# Patient Record
Sex: Male | Born: 1965
Health system: Southern US, Community
[De-identification: ages and names within clinical notes are randomized; demographics above are authoritative.]

## PROBLEM LIST (undated history)

## (undated) DIAGNOSIS — S0292XA Unspecified fracture of facial bones, initial encounter for closed fracture: Secondary | ICD-10-CM

## (undated) DIAGNOSIS — J45909 Unspecified asthma, uncomplicated: Secondary | ICD-10-CM

## (undated) DIAGNOSIS — K011 Impacted teeth: Secondary | ICD-10-CM

## (undated) DIAGNOSIS — M1990 Unspecified osteoarthritis, unspecified site: Secondary | ICD-10-CM

## (undated) DIAGNOSIS — J189 Pneumonia, unspecified organism: Secondary | ICD-10-CM

## (undated) DIAGNOSIS — S025XXA Fracture of tooth (traumatic), initial encounter for closed fracture: Secondary | ICD-10-CM

## (undated) DIAGNOSIS — W3400XA Accidental discharge from unspecified firearms or gun, initial encounter: Secondary | ICD-10-CM

## (undated) DIAGNOSIS — J449 Chronic obstructive pulmonary disease, unspecified: Secondary | ICD-10-CM

---

## 1993-10-22 DIAGNOSIS — W3400XA Accidental discharge from unspecified firearms or gun, initial encounter: Secondary | ICD-10-CM

## 1993-10-22 HISTORY — PX: PARTIAL COLECTOMY: SHX5273

## 1993-10-22 HISTORY — DX: Accidental discharge from unspecified firearms or gun, initial encounter: W34.00XA

## 1993-10-22 HISTORY — PX: EXPLORATORY LAPAROTOMY W/ BOWEL RESECTION: SHX1544

## 1993-10-22 HISTORY — PX: TOTAL NEPHRECTOMY: SHX415

## 2001-11-03 ENCOUNTER — Emergency Department (HOSPITAL_COMMUNITY): Admission: EM | Admit: 2001-11-03 | Discharge: 2001-11-03 | Payer: Self-pay | Admitting: Emergency Medicine

## 2001-11-17 ENCOUNTER — Emergency Department (HOSPITAL_COMMUNITY): Admission: EM | Admit: 2001-11-17 | Discharge: 2001-11-17 | Payer: Self-pay | Admitting: *Deleted

## 2001-11-17 ENCOUNTER — Emergency Department (HOSPITAL_COMMUNITY): Admission: EM | Admit: 2001-11-17 | Discharge: 2001-11-18 | Payer: Self-pay | Admitting: *Deleted

## 2001-11-18 ENCOUNTER — Encounter: Payer: Self-pay | Admitting: Emergency Medicine

## 2001-12-01 ENCOUNTER — Emergency Department (HOSPITAL_COMMUNITY): Admission: EM | Admit: 2001-12-01 | Discharge: 2001-12-02 | Payer: Self-pay | Admitting: Emergency Medicine

## 2001-12-02 ENCOUNTER — Encounter: Payer: Self-pay | Admitting: Emergency Medicine

## 2001-12-05 ENCOUNTER — Inpatient Hospital Stay (HOSPITAL_COMMUNITY): Admission: EM | Admit: 2001-12-05 | Discharge: 2001-12-08 | Payer: Self-pay | Admitting: Emergency Medicine

## 2001-12-05 ENCOUNTER — Encounter: Payer: Self-pay | Admitting: Emergency Medicine

## 2001-12-06 ENCOUNTER — Encounter: Payer: Self-pay | Admitting: Internal Medicine

## 2001-12-12 ENCOUNTER — Encounter: Payer: Self-pay | Admitting: *Deleted

## 2001-12-12 ENCOUNTER — Emergency Department (HOSPITAL_COMMUNITY): Admission: EM | Admit: 2001-12-12 | Discharge: 2001-12-12 | Payer: Self-pay | Admitting: Podiatry

## 2002-03-24 ENCOUNTER — Encounter: Payer: Self-pay | Admitting: Emergency Medicine

## 2002-03-24 ENCOUNTER — Emergency Department (HOSPITAL_COMMUNITY): Admission: EM | Admit: 2002-03-24 | Discharge: 2002-03-24 | Payer: Self-pay | Admitting: Emergency Medicine

## 2002-05-26 ENCOUNTER — Emergency Department (HOSPITAL_COMMUNITY): Admission: EM | Admit: 2002-05-26 | Discharge: 2002-05-26 | Payer: Self-pay | Admitting: Emergency Medicine

## 2002-06-22 ENCOUNTER — Emergency Department (HOSPITAL_COMMUNITY): Admission: EM | Admit: 2002-06-22 | Discharge: 2002-06-23 | Payer: Self-pay | Admitting: Emergency Medicine

## 2002-07-03 ENCOUNTER — Emergency Department (HOSPITAL_COMMUNITY): Admission: EM | Admit: 2002-07-03 | Discharge: 2002-07-03 | Payer: Self-pay | Admitting: Emergency Medicine

## 2002-07-13 ENCOUNTER — Emergency Department (HOSPITAL_COMMUNITY): Admission: EM | Admit: 2002-07-13 | Discharge: 2002-07-13 | Payer: Self-pay | Admitting: Emergency Medicine

## 2003-04-27 ENCOUNTER — Emergency Department (HOSPITAL_COMMUNITY): Admission: EM | Admit: 2003-04-27 | Discharge: 2003-04-27 | Payer: Self-pay | Admitting: Emergency Medicine

## 2003-05-22 ENCOUNTER — Emergency Department: Admission: AC | Admit: 2003-05-22 | Discharge: 2003-05-22 | Payer: Self-pay

## 2003-05-26 ENCOUNTER — Emergency Department (HOSPITAL_COMMUNITY): Admission: EM | Admit: 2003-05-26 | Discharge: 2003-05-26 | Payer: Self-pay | Admitting: Emergency Medicine

## 2003-05-26 ENCOUNTER — Encounter: Payer: Self-pay | Admitting: Emergency Medicine

## 2003-08-23 ENCOUNTER — Emergency Department (HOSPITAL_COMMUNITY): Admission: EM | Admit: 2003-08-23 | Discharge: 2003-08-23 | Payer: Self-pay | Admitting: Emergency Medicine

## 2003-10-23 HISTORY — PX: ABDOMINAL ADHESION SURGERY: SHX90

## 2003-10-23 HISTORY — PX: INCISIONAL HERNIA REPAIR: SHX193

## 2003-10-24 ENCOUNTER — Inpatient Hospital Stay (HOSPITAL_COMMUNITY): Admission: EM | Admit: 2003-10-24 | Discharge: 2003-11-03 | Payer: Self-pay | Admitting: Advanced Practice Midwife

## 2003-10-25 ENCOUNTER — Encounter: Payer: Self-pay | Admitting: General Surgery

## 2003-12-09 ENCOUNTER — Emergency Department (HOSPITAL_COMMUNITY): Admission: EM | Admit: 2003-12-09 | Discharge: 2003-12-09 | Payer: Self-pay | Admitting: Emergency Medicine

## 2005-11-28 ENCOUNTER — Inpatient Hospital Stay (HOSPITAL_COMMUNITY): Admission: EM | Admit: 2005-11-28 | Discharge: 2005-11-30 | Payer: Self-pay | Admitting: Emergency Medicine

## 2006-03-23 ENCOUNTER — Emergency Department (HOSPITAL_COMMUNITY): Admission: EM | Admit: 2006-03-23 | Discharge: 2006-03-23 | Payer: Self-pay | Admitting: Family Medicine

## 2006-07-23 ENCOUNTER — Emergency Department (HOSPITAL_COMMUNITY): Admission: EM | Admit: 2006-07-23 | Discharge: 2006-07-23 | Payer: Self-pay | Admitting: Emergency Medicine

## 2006-08-01 ENCOUNTER — Emergency Department (HOSPITAL_COMMUNITY): Admission: EM | Admit: 2006-08-01 | Discharge: 2006-08-01 | Payer: Self-pay | Admitting: Family Medicine

## 2006-11-17 ENCOUNTER — Emergency Department (HOSPITAL_COMMUNITY): Admission: EM | Admit: 2006-11-17 | Discharge: 2006-11-17 | Payer: Self-pay | Admitting: Emergency Medicine

## 2006-12-27 ENCOUNTER — Emergency Department (HOSPITAL_COMMUNITY): Admission: EM | Admit: 2006-12-27 | Discharge: 2006-12-28 | Payer: Self-pay | Admitting: Emergency Medicine

## 2007-04-20 ENCOUNTER — Emergency Department (HOSPITAL_COMMUNITY): Admission: EM | Admit: 2007-04-20 | Discharge: 2007-04-21 | Payer: Self-pay | Admitting: Emergency Medicine

## 2007-06-10 ENCOUNTER — Inpatient Hospital Stay (HOSPITAL_COMMUNITY): Admission: EM | Admit: 2007-06-10 | Discharge: 2007-06-12 | Payer: Self-pay | Admitting: Emergency Medicine

## 2008-02-29 ENCOUNTER — Emergency Department (HOSPITAL_COMMUNITY): Admission: EM | Admit: 2008-02-29 | Discharge: 2008-02-29 | Payer: Self-pay | Admitting: Emergency Medicine

## 2008-03-04 ENCOUNTER — Emergency Department (HOSPITAL_COMMUNITY): Admission: EM | Admit: 2008-03-04 | Discharge: 2008-03-05 | Payer: Self-pay | Admitting: Emergency Medicine

## 2008-03-22 ENCOUNTER — Emergency Department (HOSPITAL_COMMUNITY): Admission: EM | Admit: 2008-03-22 | Discharge: 2008-03-22 | Payer: Self-pay | Admitting: Emergency Medicine

## 2008-06-18 ENCOUNTER — Inpatient Hospital Stay (HOSPITAL_COMMUNITY): Admission: EM | Admit: 2008-06-18 | Discharge: 2008-06-20 | Payer: Self-pay | Admitting: Emergency Medicine

## 2008-08-02 ENCOUNTER — Emergency Department (HOSPITAL_COMMUNITY): Admission: EM | Admit: 2008-08-02 | Discharge: 2008-08-03 | Payer: Self-pay | Admitting: Emergency Medicine

## 2008-11-02 ENCOUNTER — Ambulatory Visit: Payer: Self-pay | Admitting: Internal Medicine

## 2008-11-02 ENCOUNTER — Inpatient Hospital Stay (HOSPITAL_COMMUNITY): Admission: EM | Admit: 2008-11-02 | Discharge: 2008-11-05 | Payer: Self-pay | Admitting: Emergency Medicine

## 2009-01-10 ENCOUNTER — Emergency Department (HOSPITAL_COMMUNITY): Admission: EM | Admit: 2009-01-10 | Discharge: 2009-01-10 | Payer: Self-pay | Admitting: Family Medicine

## 2009-04-21 ENCOUNTER — Emergency Department (HOSPITAL_COMMUNITY): Admission: EM | Admit: 2009-04-21 | Discharge: 2009-04-21 | Payer: Self-pay | Admitting: Emergency Medicine

## 2009-05-20 ENCOUNTER — Emergency Department (HOSPITAL_COMMUNITY): Admission: EM | Admit: 2009-05-20 | Discharge: 2009-05-20 | Payer: Self-pay | Admitting: Family Medicine

## 2009-06-20 ENCOUNTER — Emergency Department (HOSPITAL_COMMUNITY): Admission: EM | Admit: 2009-06-20 | Discharge: 2009-06-20 | Payer: Self-pay | Admitting: Family Medicine

## 2009-07-06 ENCOUNTER — Inpatient Hospital Stay (HOSPITAL_COMMUNITY): Admission: EM | Admit: 2009-07-06 | Discharge: 2009-07-08 | Payer: Self-pay | Admitting: Emergency Medicine

## 2009-07-06 ENCOUNTER — Encounter (INDEPENDENT_AMBULATORY_CARE_PROVIDER_SITE_OTHER): Payer: Self-pay | Admitting: Internal Medicine

## 2009-07-06 ENCOUNTER — Ambulatory Visit: Payer: Self-pay | Admitting: Cardiology

## 2009-07-12 ENCOUNTER — Ambulatory Visit: Payer: Self-pay | Admitting: Cardiovascular Disease

## 2009-07-12 ENCOUNTER — Inpatient Hospital Stay (HOSPITAL_COMMUNITY): Admission: EM | Admit: 2009-07-12 | Discharge: 2009-07-17 | Payer: Self-pay | Admitting: Emergency Medicine

## 2009-07-13 ENCOUNTER — Encounter (INDEPENDENT_AMBULATORY_CARE_PROVIDER_SITE_OTHER): Payer: Self-pay | Admitting: Internal Medicine

## 2009-07-25 ENCOUNTER — Emergency Department (HOSPITAL_COMMUNITY): Admission: EM | Admit: 2009-07-25 | Discharge: 2009-07-25 | Payer: Self-pay | Admitting: Family Medicine

## 2010-01-04 LAB — CONVERTED CEMR LAB
Hep A Total Ab: NEGATIVE
Hep B Core Total Ab: NEGATIVE
Hepatitis B Surface Ag: NEGATIVE

## 2010-01-05 ENCOUNTER — Encounter: Payer: Self-pay | Admitting: Physician Assistant

## 2010-01-05 ENCOUNTER — Encounter (INDEPENDENT_AMBULATORY_CARE_PROVIDER_SITE_OTHER): Payer: Self-pay | Admitting: Internal Medicine

## 2010-01-05 ENCOUNTER — Inpatient Hospital Stay (HOSPITAL_COMMUNITY): Admission: EM | Admit: 2010-01-05 | Discharge: 2010-01-06 | Payer: Self-pay | Admitting: Emergency Medicine

## 2010-01-05 ENCOUNTER — Ambulatory Visit: Payer: Self-pay | Admitting: Vascular Surgery

## 2010-01-06 ENCOUNTER — Encounter: Payer: Self-pay | Admitting: Physician Assistant

## 2010-01-22 ENCOUNTER — Emergency Department (HOSPITAL_COMMUNITY): Admission: EM | Admit: 2010-01-22 | Discharge: 2010-01-22 | Payer: Self-pay | Admitting: Emergency Medicine

## 2010-01-22 LAB — CONVERTED CEMR LAB
AST: 73 units/L
Albumin: 3.9 g/dL
BUN: 10 mg/dL
Basophils Relative: 0 %
Calcium: 9.1 mg/dL
Chloride: 103 meq/L
Creatinine, Ser: 1.11 mg/dL
Eosinophils Absolute: 0.1 10*3/uL
Glucose, Bld: 89 mg/dL
Hemoglobin: 14.6 g/dL
Lymphs Abs: 0.6 10*3/uL
MCHC: 33.7 g/dL
MCV: 98.3 fL
Monocytes Absolute: 0.3 10*3/uL
Monocytes Relative: 5 %
Neutrophils Relative %: 82 %
Potassium: 4 meq/L
RBC: 4.42 M/uL
WBC: 5.3 10*3/uL

## 2010-01-24 ENCOUNTER — Ambulatory Visit: Payer: Self-pay | Admitting: Physician Assistant

## 2010-01-24 DIAGNOSIS — F1010 Alcohol abuse, uncomplicated: Secondary | ICD-10-CM | POA: Insufficient documentation

## 2010-01-24 DIAGNOSIS — F1910 Other psychoactive substance abuse, uncomplicated: Secondary | ICD-10-CM | POA: Insufficient documentation

## 2010-01-24 DIAGNOSIS — F3289 Other specified depressive episodes: Secondary | ICD-10-CM | POA: Insufficient documentation

## 2010-01-24 DIAGNOSIS — F329 Major depressive disorder, single episode, unspecified: Secondary | ICD-10-CM | POA: Insufficient documentation

## 2010-01-24 DIAGNOSIS — J449 Chronic obstructive pulmonary disease, unspecified: Secondary | ICD-10-CM | POA: Insufficient documentation

## 2010-01-24 DIAGNOSIS — K219 Gastro-esophageal reflux disease without esophagitis: Secondary | ICD-10-CM | POA: Insufficient documentation

## 2010-01-24 DIAGNOSIS — Z8669 Personal history of other diseases of the nervous system and sense organs: Secondary | ICD-10-CM | POA: Insufficient documentation

## 2010-01-24 DIAGNOSIS — M1990 Unspecified osteoarthritis, unspecified site: Secondary | ICD-10-CM | POA: Insufficient documentation

## 2010-01-31 ENCOUNTER — Ambulatory Visit: Payer: Self-pay | Admitting: Physician Assistant

## 2010-03-29 ENCOUNTER — Encounter: Payer: Self-pay | Admitting: Physician Assistant

## 2010-10-22 HISTORY — PX: REPAIR OF FRACTURED PENIS: SHX6063

## 2010-11-21 NOTE — Letter (Signed)
Summary: Discharge Summary  Discharge Summary   Imported By: Arta Bruce 03/29/2010 15:43:26  _____________________________________________________________________  External Attachment:    Type:   Image     Comment:   External Document

## 2010-11-21 NOTE — Letter (Signed)
Summary: PT INFORMATION SHEET  PT INFORMATION SHEET   Imported By: Arta Bruce 03/28/2010 11:07:08  _____________________________________________________________________  External Attachment:    Type:   Image     Comment:   External Document

## 2010-11-21 NOTE — Letter (Signed)
Summary: FAXED REQUESTED RECORDS TO [facility]  FAXED REQUESTED RECORDS TO [facility]   Imported By: Arta Bruce 03/29/2010 11:53:18  _____________________________________________________________________  External Attachment:    Type:   Image     Comment:   External Document
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
## 2010-11-21 NOTE — Progress Notes (Signed)
Summary: Office Visit//DEPRESSION SCREENING  Office Visit//DEPRESSION SCREENING   Imported By: Arta Bruce 03/29/2010 15:39:56  _____________________________________________________________________  External Attachment:    Type:   Image     Comment:   External Document

## 2010-11-21 NOTE — Letter (Signed)
Summary: REFERRAL//PSYCHOLOGY//AMANDA  REFERRAL//PSYCHOLOGY//AMANDA   Imported By: Arta Bruce 03/28/2010 11:27:01  _____________________________________________________________________  External Attachment:    Type:   Image     Comment:   External Document

## 2010-11-24 NOTE — Letter (Signed)
Summary: REFERRAL//MISC//VANESSA  REFERRAL//MISC//VANESSA   Imported By: Arta Bruce 03/28/2010 11:29:03  _____________________________________________________________________  External Attachment:    Type:   Image     Comment:   External Document

## 2011-01-10 LAB — HEPATIC FUNCTION PANEL
AST: 73 U/L — ABNORMAL HIGH (ref 0–37)
Albumin: 3.9 g/dL (ref 3.5–5.2)
Total Bilirubin: 0.7 mg/dL (ref 0.3–1.2)
Total Protein: 7.1 g/dL (ref 6.0–8.3)

## 2011-01-10 LAB — CBC
MCHC: 33.7 g/dL (ref 30.0–36.0)
MCV: 98.3 fL (ref 78.0–100.0)
Platelets: 171 10*3/uL (ref 150–400)
RDW: 15.5 % (ref 11.5–15.5)

## 2011-01-10 LAB — DIFFERENTIAL
Basophils Absolute: 0 10*3/uL (ref 0.0–0.1)
Basophils Relative: 0 % (ref 0–1)
Eosinophils Absolute: 0.1 10*3/uL (ref 0.0–0.7)
Monocytes Relative: 5 % (ref 3–12)
Neutro Abs: 4.4 10*3/uL (ref 1.7–7.7)
Neutrophils Relative %: 82 % — ABNORMAL HIGH (ref 43–77)

## 2011-01-10 LAB — URINALYSIS, ROUTINE W REFLEX MICROSCOPIC
Ketones, ur: NEGATIVE mg/dL
Nitrite: NEGATIVE
pH: 6 (ref 5.0–8.0)

## 2011-01-10 LAB — BASIC METABOLIC PANEL
BUN: 10 mg/dL (ref 6–23)
CO2: 27 mEq/L (ref 19–32)
Calcium: 9.1 mg/dL (ref 8.4–10.5)
Chloride: 103 mEq/L (ref 96–112)
Creatinine, Ser: 1.11 mg/dL (ref 0.4–1.5)
GFR calc Af Amer: >60 mL/min (ref >60)
Glucose, Bld: 89 mg/dL (ref 70–99)

## 2011-01-10 LAB — LIPASE, BLOOD: Lipase: 28 U/L (ref 11–59)

## 2011-01-14 LAB — HEPATITIS PANEL, ACUTE
HCV Ab: NEGATIVE
Hep A IgM: NEGATIVE
Hepatitis B Surface Ag: NEGATIVE

## 2011-01-14 LAB — CBC
Hemoglobin: 11.9 g/dL — ABNORMAL LOW (ref 13.0–17.0)
Hemoglobin: 13.2 g/dL (ref 13.0–17.0)
MCHC: 33.3 g/dL (ref 30.0–36.0)
MCV: 100 fL (ref 78.0–100.0)
Platelets: 170 10*3/uL (ref 150–400)
RBC: 3.57 MIL/uL — ABNORMAL LOW (ref 4.22–5.81)
RDW: 15.2 % (ref 11.5–15.5)
WBC: 5.4 10*3/uL (ref 4.0–10.5)

## 2011-01-14 LAB — CK TOTAL AND CKMB (NOT AT ARMC)
CK, MB: 7.4 ng/mL (ref 0.3–4.0)
Relative Index: 1.3 (ref 0.0–2.5)
Total CK: 550 U/L — ABNORMAL HIGH (ref 7–232)

## 2011-01-14 LAB — COMPREHENSIVE METABOLIC PANEL
ALT: 26 U/L (ref 0–53)
ALT: 29 U/L (ref 0–53)
Albumin: 3.7 g/dL (ref 3.5–5.2)
Alkaline Phosphatase: 67 U/L (ref 39–117)
CO2: 25 mEq/L (ref 19–32)
Calcium: 8.3 mg/dL — ABNORMAL LOW (ref 8.4–10.5)
Chloride: 103 mEq/L (ref 96–112)
GFR calc non Af Amer: >60 mL/min (ref >60)
Glucose, Bld: 196 mg/dL — ABNORMAL HIGH (ref 70–99)
Glucose, Bld: 83 mg/dL (ref 70–99)
Potassium: 4 mEq/L (ref 3.5–5.1)
Sodium: 137 mEq/L (ref 135–145)
Sodium: 139 mEq/L (ref 135–145)
Total Protein: 6.6 g/dL (ref 6.0–8.3)

## 2011-01-14 LAB — PROTIME-INR
INR: 1.14 (ref 0.00–1.49)
Prothrombin Time: 14.5 seconds (ref 11.6–15.2)

## 2011-01-14 LAB — CARDIAC PANEL(CRET KIN+CKTOT+MB+TROPI)
CK, MB: 4.3 ng/mL — ABNORMAL HIGH (ref 0.3–4.0)
CK, MB: 5.4 ng/mL — ABNORMAL HIGH (ref 0.3–4.0)
Total CK: 358 U/L — ABNORMAL HIGH (ref 7–232)
Troponin I: 0.01 ng/mL (ref 0.00–0.06)

## 2011-01-14 LAB — DIFFERENTIAL
Basophils Absolute: 0 10*3/uL (ref 0.0–0.1)
Basophils Relative: 0 % (ref 0–1)
Basophils Relative: 0 % (ref 0–1)
Eosinophils Absolute: 0.1 10*3/uL (ref 0.0–0.7)
Eosinophils Absolute: 0.1 10*3/uL (ref 0.0–0.7)
Eosinophils Relative: 2 % (ref 0–5)
Lymphs Abs: 1.5 10*3/uL (ref 0.7–4.0)
Monocytes Absolute: 0.4 10*3/uL (ref 0.1–1.0)
Monocytes Relative: 8 % (ref 3–12)
Neutrophils Relative %: 50 % (ref 43–77)

## 2011-01-14 LAB — TROPONIN I: Troponin I: 0.02 ng/mL (ref 0.00–0.06)

## 2011-01-14 LAB — RAPID URINE DRUG SCREEN, HOSP PERFORMED: Tetrahydrocannabinol: POSITIVE — AB

## 2011-01-25 LAB — GC/CHLAMYDIA PROBE AMP, GENITAL
Chlamydia, DNA Probe: NEGATIVE
GC Probe Amp, Genital: POSITIVE — AB

## 2011-01-26 LAB — URINALYSIS, ROUTINE W REFLEX MICROSCOPIC
Bilirubin Urine: NEGATIVE
Leukocytes, UA: NEGATIVE
Nitrite: NEGATIVE
Nitrite: NEGATIVE
Protein, ur: NEGATIVE mg/dL
Protein, ur: NEGATIVE mg/dL
Specific Gravity, Urine: 1.005 (ref 1.005–1.030)
Specific Gravity, Urine: 1.024 (ref 1.005–1.030)
Urobilinogen, UA: 0.2 mg/dL (ref 0.0–1.0)
Urobilinogen, UA: 1 mg/dL (ref 0.0–1.0)

## 2011-01-26 LAB — CBC
HCT: 36.8 % — ABNORMAL LOW (ref 39.0–52.0)
HCT: 41.6 % (ref 39.0–52.0)
HCT: 37.2 % — ABNORMAL LOW (ref 39.0–52.0)
HCT: 43.9 % (ref 39.0–52.0)
Hemoglobin: 12.4 g/dL — ABNORMAL LOW (ref 13.0–17.0)
Hemoglobin: 12.8 g/dL — ABNORMAL LOW (ref 13.0–17.0)
Hemoglobin: 12.8 g/dL — ABNORMAL LOW (ref 13.0–17.0)
Hemoglobin: 14.8 g/dL (ref 13.0–17.0)
MCHC: 33.5 g/dL (ref 30.0–36.0)
MCHC: 33.6 g/dL (ref 30.0–36.0)
MCHC: 33.7 g/dL (ref 30.0–36.0)
MCHC: 34.3 g/dL (ref 30.0–36.0)
MCV: 101.9 fL — ABNORMAL HIGH (ref 78.0–100.0)
MCV: 103.2 fL — ABNORMAL HIGH (ref 78.0–100.0)
MCV: 102.5 fL — ABNORMAL HIGH (ref 78.0–100.0)
Platelets: 147 10*3/uL — ABNORMAL LOW (ref 150–400)
RBC: 3.56 MIL/uL — ABNORMAL LOW (ref 4.22–5.81)
RBC: 3.7 MIL/uL — ABNORMAL LOW (ref 4.22–5.81)
RBC: 4.09 MIL/uL — ABNORMAL LOW (ref 4.22–5.81)
RBC: 3.63 MIL/uL — ABNORMAL LOW (ref 4.22–5.81)
RBC: 4.28 MIL/uL (ref 4.22–5.81)
RDW: 14.4 % (ref 11.5–15.5)
RDW: 14.4 % (ref 11.5–15.5)
WBC: 3.2 10*3/uL — ABNORMAL LOW (ref 4.0–10.5)
WBC: 3.6 10*3/uL — ABNORMAL LOW (ref 4.0–10.5)
WBC: 6.1 10*3/uL (ref 4.0–10.5)

## 2011-01-26 LAB — BASIC METABOLIC PANEL
CO2: 24 mEq/L (ref 19–32)
Calcium: 8.1 mg/dL — ABNORMAL LOW (ref 8.4–10.5)
Chloride: 107 mEq/L (ref 96–112)
Creatinine, Ser: 1.34 mg/dL (ref 0.4–1.5)
GFR calc Af Amer: >60 mL/min (ref >60)
GFR calc Af Amer: >60 mL/min (ref >60)
GFR calc non Af Amer: >60 mL/min (ref >60)
Potassium: 3.8 mEq/L (ref 3.5–5.1)
Potassium: 4 mEq/L (ref 3.5–5.1)
Sodium: 137 mEq/L (ref 135–145)
Sodium: 138 mEq/L (ref 135–145)

## 2011-01-26 LAB — RAPID URINE DRUG SCREEN, HOSP PERFORMED
Amphetamines: NOT DETECTED
Amphetamines: NOT DETECTED
Barbiturates: NOT DETECTED
Benzodiazepines: NOT DETECTED
Benzodiazepines: POSITIVE — AB
Cocaine: POSITIVE — AB
Opiates: NOT DETECTED
Tetrahydrocannabinol: NOT DETECTED
Tetrahydrocannabinol: POSITIVE — AB

## 2011-01-26 LAB — COMPREHENSIVE METABOLIC PANEL
ALT: 120 U/L — ABNORMAL HIGH (ref 0–53)
Alkaline Phosphatase: 75 U/L (ref 39–117)
BUN: 5 mg/dL — ABNORMAL LOW (ref 6–23)
BUN: 6 mg/dL (ref 6–23)
CO2: 27 mEq/L (ref 19–32)
CO2: 28 mEq/L (ref 19–32)
Calcium: 8.2 mg/dL — ABNORMAL LOW (ref 8.4–10.5)
Calcium: 8.9 mg/dL (ref 8.4–10.5)
Chloride: 104 mEq/L (ref 96–112)
Creatinine, Ser: 1.16 mg/dL (ref 0.4–1.5)
GFR calc non Af Amer: >60 mL/min (ref >60)
GFR calc non Af Amer: >60 mL/min (ref >60)
Glucose, Bld: 81 mg/dL (ref 70–99)
Glucose, Bld: 89 mg/dL (ref 70–99)
Total Bilirubin: 0.8 mg/dL (ref 0.3–1.2)
Total Protein: 6.9 g/dL (ref 6.0–8.3)

## 2011-01-26 LAB — POCT CARDIAC MARKERS
CKMB, poc: 9.3 ng/mL (ref 1.0–8.0)
Myoglobin, poc: 389 ng/mL (ref 12–200)
Troponin i, poc: <0.05 ng/mL (ref 0.00–0.09)

## 2011-01-26 LAB — POCT I-STAT, CHEM 8
BUN: 9 mg/dL (ref 6–23)
Chloride: 103 mEq/L (ref 96–112)
HCT: 46 % (ref 39.0–52.0)
Sodium: 140 mEq/L (ref 135–145)
TCO2: 25 mmol/L (ref 0–100)

## 2011-01-26 LAB — LIPID PANEL
Cholesterol: 131 mg/dL (ref 0–200)
Cholesterol: 160 mg/dL (ref 0–200)
HDL: 58 mg/dL (ref >39)
LDL Cholesterol: 65 mg/dL (ref 0–99)
Total CHOL/HDL Ratio: 2.3 RATIO
Triglycerides: 45 mg/dL (ref <150)
VLDL: 14 mg/dL (ref 0–40)
VLDL: 9 mg/dL (ref 0–40)

## 2011-01-26 LAB — CARDIAC PANEL(CRET KIN+CKTOT+MB+TROPI)
CK, MB: 2 ng/mL (ref 0.3–4.0)
CK, MB: 2.9 ng/mL (ref 0.3–4.0)
Relative Index: 0.5 (ref 0.0–2.5)
Total CK: 465 U/L — ABNORMAL HIGH (ref 7–232)
Total CK: 617 U/L — ABNORMAL HIGH (ref 7–232)
Total CK: 536 U/L — ABNORMAL HIGH (ref 7–232)
Troponin I: 0.02 ng/mL (ref 0.00–0.06)
Troponin I: 0.02 ng/mL (ref 0.00–0.06)

## 2011-01-26 LAB — POCT I-STAT 3, ART BLOOD GAS (G3+)
Acid-base deficit: 4 mmol/L — ABNORMAL HIGH (ref 0.0–2.0)
Bicarbonate: 22.8 mEq/L (ref 20.0–24.0)
O2 Saturation: 100 %
Patient temperature: 98.7
TCO2: 24 mmol/L (ref 0–100)
pH, Arterial: 7.3 — ABNORMAL LOW (ref 7.350–7.450)

## 2011-01-26 LAB — ETHANOL: Alcohol, Ethyl (B): 119 mg/dL — ABNORMAL HIGH (ref 0–10)

## 2011-01-26 LAB — CK TOTAL AND CKMB (NOT AT ARMC)
Relative Index: 0.9 (ref 0.0–2.5)
Total CK: 921 U/L — ABNORMAL HIGH (ref 7–232)

## 2011-01-26 LAB — POCT I-STAT 3, VENOUS BLOOD GAS (G3P V)
Acid-Base Excess: 6 mmol/L — ABNORMAL HIGH (ref 0.0–2.0)
Bicarbonate: 33.4 mEq/L — ABNORMAL HIGH (ref 20.0–24.0)
TCO2: 35 mmol/L (ref 0–100)
pH, Ven: 7.385 — ABNORMAL HIGH (ref 7.250–7.300)

## 2011-01-26 LAB — FOLATE: Folate: 16.5 ng/mL

## 2011-01-26 LAB — TSH: TSH: 0.657 u[IU]/mL (ref 0.350–4.500)

## 2011-01-26 LAB — RETICULOCYTES: Retic Ct Pct: 0.9 % (ref 0.4–3.1)

## 2011-01-26 LAB — IRON AND TIBC
Iron: 60 ug/dL (ref 42–135)
Saturation Ratios: 27 % (ref 20–55)
TIBC: 226 ug/dL (ref 215–435)
UIBC: 166 ug/dL

## 2011-01-26 LAB — URINE MICROSCOPIC-ADD ON

## 2011-01-26 LAB — LIPASE, BLOOD: Lipase: 24 U/L (ref 11–59)

## 2011-01-26 LAB — APTT: aPTT: 28 seconds (ref 24–37)

## 2011-01-26 LAB — LACTIC ACID, PLASMA: Lactic Acid, Venous: 3.4 mmol/L — ABNORMAL HIGH (ref 0.5–2.2)

## 2011-01-26 LAB — FERRITIN: Ferritin: 103 ng/mL (ref 22–322)

## 2011-01-27 LAB — GC/CHLAMYDIA PROBE AMP, GENITAL: Chlamydia, DNA Probe: NEGATIVE

## 2011-02-05 LAB — CULTURE, BAL-QUANTITATIVE W GRAM STAIN: Colony Count: 25000

## 2011-02-05 LAB — BASIC METABOLIC PANEL
CO2: 26 mEq/L (ref 19–32)
CO2: 28 mEq/L (ref 19–32)
Calcium: 8.5 mg/dL (ref 8.4–10.5)
Calcium: 8.7 mg/dL (ref 8.4–10.5)
Creatinine, Ser: 1.36 mg/dL (ref 0.4–1.5)
GFR calc Af Amer: >60 mL/min (ref >60)
GFR calc non Af Amer: 58 mL/min — ABNORMAL LOW (ref >60)
Glucose, Bld: 90 mg/dL (ref 70–99)
Sodium: 140 mEq/L (ref 135–145)
Sodium: 143 mEq/L (ref 135–145)

## 2011-02-05 LAB — DIFFERENTIAL
Basophils Absolute: 0 10*3/uL (ref 0.0–0.1)
Basophils Absolute: 0 10*3/uL (ref 0.0–0.1)
Basophils Relative: 0 % (ref 0–1)
Basophils Relative: 0 % (ref 0–1)
Eosinophils Absolute: 0 10*3/uL (ref 0.0–0.7)
Lymphocytes Relative: 17 % (ref 12–46)
Monocytes Absolute: 0.4 10*3/uL (ref 0.1–1.0)
Monocytes Relative: 5 % (ref 3–12)
Monocytes Relative: 8 % (ref 3–12)
Neutro Abs: 4.1 10*3/uL (ref 1.7–7.7)
Neutrophils Relative %: 75 % (ref 43–77)
Neutrophils Relative %: 83 % — ABNORMAL HIGH (ref 43–77)

## 2011-02-05 LAB — COMPREHENSIVE METABOLIC PANEL
ALT: 18 U/L (ref 0–53)
AST: 25 U/L (ref 0–37)
Albumin: 2.9 g/dL — ABNORMAL LOW (ref 3.5–5.2)
Albumin: 3.1 g/dL — ABNORMAL LOW (ref 3.5–5.2)
Alkaline Phosphatase: 42 U/L (ref 39–117)
BUN: 9 mg/dL (ref 6–23)
CO2: 25 mEq/L (ref 19–32)
Chloride: 109 mEq/L (ref 96–112)
Creatinine, Ser: 1.23 mg/dL (ref 0.4–1.5)
GFR calc Af Amer: 57 mL/min — ABNORMAL LOW (ref >60)
Glucose, Bld: 98 mg/dL (ref 70–99)
Potassium: 4.2 mEq/L (ref 3.5–5.1)
Total Bilirubin: 0.7 mg/dL (ref 0.3–1.2)
Total Bilirubin: 0.9 mg/dL (ref 0.3–1.2)
Total Protein: 5.6 g/dL — ABNORMAL LOW (ref 6.0–8.3)

## 2011-02-05 LAB — CBC
HCT: 34.3 % — ABNORMAL LOW (ref 39.0–52.0)
HCT: 43.4 % (ref 39.0–52.0)
Hemoglobin: 11.7 g/dL — ABNORMAL LOW (ref 13.0–17.0)
MCHC: 33.8 g/dL (ref 30.0–36.0)
MCHC: 33.8 g/dL (ref 30.0–36.0)
MCHC: 34 g/dL (ref 30.0–36.0)
MCV: 100.2 fL — ABNORMAL HIGH (ref 78.0–100.0)
Platelets: 165 10*3/uL (ref 150–400)
Platelets: 174 10*3/uL (ref 150–400)
Platelets: 187 10*3/uL (ref 150–400)
RBC: 3.76 MIL/uL — ABNORMAL LOW (ref 4.22–5.81)
RDW: 13.6 % (ref 11.5–15.5)
RDW: 13.7 % (ref 11.5–15.5)
RDW: 13.9 % (ref 11.5–15.5)
RDW: 13.9 % (ref 11.5–15.5)
WBC: 9.6 10*3/uL (ref 4.0–10.5)

## 2011-02-05 LAB — URINALYSIS, ROUTINE W REFLEX MICROSCOPIC
Bilirubin Urine: NEGATIVE
Glucose, UA: NEGATIVE mg/dL
Protein, ur: 30 mg/dL — AB
Specific Gravity, Urine: 1.008 (ref 1.005–1.030)

## 2011-02-05 LAB — CULTURE, BLOOD (ROUTINE X 2)

## 2011-02-05 LAB — CARDIAC PANEL(CRET KIN+CKTOT+MB+TROPI)
CK, MB: 4.3 ng/mL — ABNORMAL HIGH (ref 0.3–4.0)
Relative Index: 0.4 (ref 0.0–2.5)
Relative Index: 0.6 (ref 0.0–2.5)
Total CK: 416 U/L — ABNORMAL HIGH (ref 7–232)
Total CK: 735 U/L — ABNORMAL HIGH (ref 7–232)
Troponin I: 0.01 ng/mL (ref 0.00–0.06)

## 2011-02-05 LAB — ACETAMINOPHEN LEVEL: Acetaminophen (Tylenol), Serum: <10 ug/mL — ABNORMAL LOW (ref 10–30)

## 2011-02-05 LAB — POCT I-STAT 3, ART BLOOD GAS (G3+)
Bicarbonate: 25 mEq/L — ABNORMAL HIGH (ref 20.0–24.0)
O2 Saturation: 93 %
Patient temperature: 97.6
TCO2: 26 mmol/L (ref 0–100)
pO2, Arterial: 66 mmHg — ABNORMAL LOW (ref 80.0–100.0)

## 2011-02-05 LAB — CULTURE, RESPIRATORY W GRAM STAIN

## 2011-02-05 LAB — URINE CULTURE: Colony Count: NO GROWTH

## 2011-02-05 LAB — URINE MICROSCOPIC-ADD ON

## 2011-02-05 LAB — PROLACTIN: Prolactin: 12 ng/mL (ref 2.1–17.1)

## 2011-02-05 LAB — SALICYLATE LEVEL: Salicylate Lvl: <4 mg/dL (ref 2.8–20.0)

## 2011-02-05 LAB — BLOOD GAS, ARTERIAL
Acid-Base Excess: 2.2 mmol/L — ABNORMAL HIGH (ref 0.0–2.0)
FIO2: 0.4 %
MECHVT: 500 mL
Patient temperature: 101.2
RATE: 14 resp/min
TCO2: 27.7 mmol/L (ref 0–100)
pH, Arterial: 7.394 (ref 7.350–7.450)

## 2011-02-05 LAB — LACTIC ACID, PLASMA: Lactic Acid, Venous: <0.3 mmol/L — ABNORMAL LOW (ref 0.5–2.2)

## 2011-02-05 LAB — AMMONIA: Ammonia: 38 umol/L — ABNORMAL HIGH (ref 11–35)

## 2011-02-05 LAB — PHOSPHORUS: Phosphorus: 5.9 mg/dL — ABNORMAL HIGH (ref 2.3–4.6)

## 2011-02-05 LAB — CK TOTAL AND CKMB (NOT AT ARMC)
CK, MB: 3.3 ng/mL (ref 0.3–4.0)
Relative Index: 0.6 (ref 0.0–2.5)

## 2011-02-05 LAB — PROTIME-INR: INR: 1.1 (ref 0.00–1.49)

## 2011-02-05 LAB — RAPID URINE DRUG SCREEN, HOSP PERFORMED
Amphetamines: NOT DETECTED
Tetrahydrocannabinol: NOT DETECTED

## 2011-02-05 LAB — APTT: aPTT: 22 seconds — ABNORMAL LOW (ref 24–37)

## 2011-02-05 LAB — GLUCOSE, CAPILLARY: Glucose-Capillary: 104 mg/dL — ABNORMAL HIGH (ref 70–99)
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
## 2011-03-06 NOTE — Discharge Summary (Signed)
NAME:  David Benson, David Benson              ACCOUNT NO.:  [id number]   MEDICAL RECORD NO.:  [id number]          PATIENT TYPE:  INP   LOCATION:  1422                         FACILITY[facility]   PHYSICIAN:  Beckey Rutter, MD  DATE OF BIRTH:  [birth year]   DATE OF ADMISSION:  06/09/2007  DATE OF DISCHARGE:  06/12/2007                               DISCHARGE SUMMARY   PRIMARY CARE PHYSICIAN:  The patient is unassigned to [facility].   CHIEF COMPLAINT:  Seizure witnessed by his wife.   HISTORY OF PRESENT ILLNESS:  A 45 year old African-American male with  history significant for polysubstance abuse presented with seizure as  per his wife description.   HOSPITAL COURSE:  The patient was admitted to the step-down unit  slash/ICU for the first 24-hour because the patient was unresponsive  with high risk of seizure.  At that time, his alcohol level is elevated  and he had positive cocaine in his drug screen. It  was negative  tetrahydrocannabinol.  The patient improved during the hospitalization  to that degree and now he is stable to be released.  The patient was  counseled for polysubstance abuse and encouraged to join rehab for his  alcohol.  The patient also was counseled regarding his cocaine abuse and  the adverse effect of cocaine on this health was discussed in detail.   DISCHARGE MEDICATIONS:  Tylenol p.r.n.   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   DISCHARGE/PLAN:  The patient was encouraged to follow up with a primary  physician and encouraged to follow-up maybe with outside rehab for his  alcohol abuse as well as the substance abuse.      Beckey Rutter, MD  Electronically Signed     EME/MEDQ  D:  06/12/2007  T:  06/13/2007  Job:  [id number]
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
## 2011-03-09 NOTE — Op Note (Signed)
NAME:  ROYER, CRISTOBAL                          ACCOUNT NO.:  [id number]   MEDICAL RECORD NO.:  [id number]                   PATIENT TYPE:  INP   LOCATION:  0353                                 FACILITY:  [facility]   PHYSICIAN:  Adolph Pollack, M.D.            DATE OF BIRTH:  [birth year]   DATE OF PROCEDURE:  10/28/2003  DATE OF DISCHARGE:                                 OPERATIVE REPORT   PREOPERATIVE DIAGNOSIS:  Partial small bowel obstruction secondary to  incarcerated recurrent left flank incisional hernia.   POSTOPERATIVE DIAGNOSIS:  Partial small bowel obstruction secondary to  incarcerated recurrent left flank incisional hernia.   OPERATION/PROCEDURE:  1. Exploratory laparotomy.  2. Lysis of adhesions.  3. Repair of recurrent left flank incisional hernia with mesh (polypropylene     with nonadhesive barrier).   SURGEON:  Adolph Pollack, M.D.   ASSISTANT:  Sheppard Plumber. Earlene Plater, M.D.   ANESTHESIA:  General.   INDICATIONS:  This 45 year old male has had multiple previous abdominal  operations and has had a left nephrectomy and had two previous left flank  incisional hernias.  He presented to the hospital with signs of partial  small bowel obstruction and has improved.  However, he still has on CT scan  some dilated bowel outside the abdominal wall in the left flank region with  an obvious transition zone and now presents for the above procedure.  We  went over the procedure, rationale and risks.  The risks include but are not  limited to bleeding, infection, intestinal injury, risk of anesthesia, and  recurrent hernia.  We also talked about significant postoperative activity  restrictions.   DESCRIPTION OF PROCEDURE:  He was seen in the holding area and brought to  the operating room, placed supine on the operating table.  General  anesthesia was administered, Foley catheter was placed in his bladder.  He  was then placed in the left lateral decubitus position.  The left  flank and  abdominal wall were sterilely prepped and draped.  The old incision was  reincised and then extended medially through the skin and subcutaneous  tissue, and then the fascia medially was incised.  I then identified the  hernia sac and gently incised the sac and noticed there was dilated small  bowel coming up and going anterior to the rib cage on the left side, into  the subcutaneous tunnel.   I began, using sharp dissection, to lyse adhesions and there were numerous  adhesions between the small bowel and the costal margin and small bowel and  posterior rib cage that were fairly dense.  I was able to mobilize the small  bowel until I found a transition zone between dilated and nondilated small  bowel and lysed an adhesion here.  This took me approximately 1 hour and 10  minutes.   Following this, I was able to reduce the small bowel back into the abdominal  cavity.  The medial fascia  I then closed in a single layer with a running  #1 Novofil suture.  I then brought a piece of polypropylene mesh to the  field, one side having a nonadhesive barrier to it.  Using #1 Novofil  sutures, I anchored this subfascially in the peritoneal cavity with full-  thickness fascial stitches.  As I was bringing this across the flank region,  I had the anesthetist hold the patient in end-expiration and put some of  these stitches in the pericostal region for anchoring purposes.  I did this  over the entire circumference of the incision with respect to placing the  mesh in the subfascial position and anchoring it to the fascia with  interrupted #1 Novofil sutures.  This more than adequately covered the  defect with adequate overlap and was not under any tension.   Following this, I irrigated out the wound.  I then closed the residual  attenuated fascia and some mesh I had cut across from a previous repair with  interrupted 0 Novofil sutures.  The subcutaneous tissue was irrigated.  The  skin was  closed with staples.   He tolerated the procedure well without any apparent complications.  He  subsequently was taken to the recovery room in satisfactory condition.                                               Adolph Pollack, M.D.    Kari Baars  D:  10/28/2003  T:  10/28/2003  Job:  [id number]

## 2011-03-09 NOTE — Discharge Summary (Signed)
[place]  Patient:    David Benson, David Benson Visit Number: [id number] MRN: [id number]          Service Type: Attending:  Alvester Morin, M.D. Dictated by:   Thayer Headings, M.D. Adm. Date:  12/05/01 Disc. Date: 12/08/01                             Discharge Summary  DISCHARGE DIAGNOSES: 1. Chest pain secondary to cocaine abuse. 2. Cocaine abuse. 3. Tobacco abuse. 4. Alcohol abuse.  DISCHARGE MEDICATIONS: 1. Aspirin 325 mg q.d. 2. Multivitamin one q.d.  DISPOSITION:  To home.  Will make hospital follow up appointment in one to two weeks.  HISTORY OF PRESENT ILLNESS:  The patient is a 45 year old African-American male with a history of polysubstance abuse including cocaine, alcohol and tobacco who now complains of substernal sharp chest pain that radiates to the left chest with associated tingling in bilateral hands and one foot. This began the night before admission and cocaine was used a few hours prior to this.  He denies any chest pain or dyspnea on exertion prior to the cocaine use.  The pain is not associated with shortness of breath, nausea, diaphoresis.  He has had several prior episodes of cocaine-related chest pain.  PAST MEDICAL HISTORY:  No history of myocardial infarction.  MEDICATIONS ON ADMISSION:  None.  ALLERGIES: No known drug allergies.  SOCIAL HISTORY:  Smokes one pack per day times four years, drinks 40 ounces of alcohol per day and does use cocaine currently.  PHYSICAL EXAMINATION:  VITAL SIGNS: Temperature 97.2, blood pressure 121/80. Pulse 60, respirations 18, oxygen saturation 100% on room air.  GENERAL:  The patient is a 45 year old African-American male in no acute distress.  HEENT:  Normocephalic, atraumatic. Pupils are equal, round and reactive to light.  Extraocular movements intact. Oropharynx is without lesions.  NECK:  Supple without jugular venous distension.  CHEST:  Clear to auscultation  bilaterally.  CARDIOVASCULAR:  Regular rate and rhythm without murmurs, rubs, or gallops.  ABDOMEN: Soft, nontender, nondistended, normal active bowel sounds.  EXTREMITIES:  No edema, cyanosis or clubbing.  NEUROLOGICAL:  Alert and oriented times 3, no focal deficits.  LABORATORY DATA:  Sodium 140, potassium 3.6, chloride 106, cO2 27, BUN 8, creatinine 1.2, glucose 99, calcium 9.2, bilirubin 0.7, alkaline phosphatase 52, AST 27, ALT 18, protein 6.5, albumin 3.7.  White blood cell count 6.0, hemoglobin 14.0, MCV 98, platelet count 177.  First CK 441, MB 5.3, troponin-I 0.02.  Alcohol less than 5.  Magnesium 1.8. Protime 13.4, INR 1.0, PTT 34. Urine drug screen is positive for cocaine.  Chest x-ray shows atelectasis at bilateral bases.  Electrocardiogram has normal sinus rhythm with rate 65. Positive left ventricular hypertrophy, ST elevation in V1 through V3, versus J point elevation, no T wave inversion.  HOSPITAL COURSE: #1 - CHEST PAIN:  Based on the timing of the patients chest pain it seems likely that it was caused by cocaine leading to vasoconstriction causing cardiac ischemia. It is also possible that his chest pain was related to gastroesophageal reflux disease or musculoskeletal causes.  He was started on nitroglycerin drip, aspirin, morphine and oxygen when he arrived.  His cardiac enzymes were checked and were negative X3.  No cardiac catheterization or Cardiolite was considered necessary in this patient as he is a young healthy man who had just used cocaine.  He did not have any chest pain  on the last couple days of admission.  #2 - COCAINE ABUSE:  The patient was encouraged to quit cocaine.  He does not show much interest in doing so.  He was given drug abuse information including the phone numbers and addresses of several different drug programs.  He was told that cocaine would likely kill him at some point if he continued to use it.  He expressed understanding of  his risk.  DISCHARGE LABORATORY DATA:  White blood cell count 6.2, hemoglobin 13.0, platelet count 177K.  Human immunodeficiency virus test was negative. Sodium 141, potassium 4.0, chloride 108, cO2 30, glucose 95, BUN 10, creatinine 1.0, calcium 8.7. Dictated by:   Thayer Headings, M.D. Attending:  Alvester Morin, M.D. DD:  02/16/02 TD:  02/17/02 Job: [id number]
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
## 2011-03-09 NOTE — Discharge Summary (Signed)
NAME:  ONDRE, SALVETTI                        ACCOUNT NO.:  [id number]   MEDICAL RECORD NO.:  [id number]                   PATIENT TYPE:  INP   LOCATION:  0353                                 FACILITY:  [facility]   PHYSICIAN:  Adolph Pollack, M.D.            DATE OF BIRTH:  Nov 16, 1965   DATE OF ADMISSION:  10/24/2003  DATE OF DISCHARGE:  11/03/2003                                 DISCHARGE SUMMARY   PRIMARY DISCHARGE DIAGNOSIS:  Incarcerated recurrent left flank incisional  hernia.   SECONDARY DIAGNOSES:  1. Partial small bowel obstruction.  2. Depression.   PROCEDURE:  Exploratory laparotomy, lysis of adhesions, repair of  incarcerated left flank incisional hernia with mesh - October 28, 2003.   REASON FOR ADMISSION:  This 45 year old male has had four previous abdominal  operations including two hernia repairs of a left flank incision.  He  presented with signs and symptoms of a partial small bowel obstruction and  was admitted to the hospital.   HOSPITAL COURSE:  An NG tube was placed and he as admitted and hydrated.  Serial x-rays were performed.  He did not appear to be getting better  relatively rapidly so a CT scan was performed which demonstrated a left  flank abdominal wall defect containing some small bowel with some proximal  dilated segments.  Contrast was in the colon, however, demonstrating this  was just a partial obstruction.  He actually improved somewhat after that  with having bowel movements and passing gas.  However, I recommended  operating while he was in the hospital.  I felt there was high risk for him  to have recurrence of the problem.  He was taken to the operating room on  October 28, 2003 where he underwent the above procedure.  Postoperatively he  developed some flash pulmonary edema in the recovery room which was treated  adequately with diuresis.  He was having some trouble with itching with  morphine and Dilaudid and was switched to Demerol and  Phenergan and a  Dulcolax was given.  NG tube was clamped on postoperative day #4 and he  started having bowel activity.  NG tube was then removed and a diet was  started.  By postoperative day #6 he was feeling better, eating, bowels were  moving, incision was clean and intact, and he was felt to be ready for  discharge.   DISPOSITION:  Discharged to home November 03, 2003 in satisfactory condition.  Strict activity restrictions were given to him.  He was told to take his  home medicines and a laxative of choice if needed.  He was given Tylox for  pain.  He was told to call for any problems and to return to the office in 1  week for follow-up.  Adolph Pollack, M.D.    Kari Baars  D:  12/02/2003  T:  12/02/2003  Job:  [id number]

## 2011-05-14 ENCOUNTER — Emergency Department (HOSPITAL_COMMUNITY)
Admission: EM | Admit: 2011-05-14 | Discharge: 2011-05-14 | Disposition: A | Discharge status: Home / Self Care | Payer: No Typology Code available for payment source | Attending: Emergency Medicine | Admitting: Emergency Medicine

## 2011-05-14 ENCOUNTER — Emergency Department (HOSPITAL_COMMUNITY): Payer: No Typology Code available for payment source

## 2011-05-14 DIAGNOSIS — Y9229 Other specified public building as the place of occurrence of the external cause: Secondary | ICD-10-CM | POA: Insufficient documentation

## 2011-05-14 DIAGNOSIS — I10 Essential (primary) hypertension: Secondary | ICD-10-CM | POA: Insufficient documentation

## 2011-05-14 DIAGNOSIS — F17200 Nicotine dependence, unspecified, uncomplicated: Secondary | ICD-10-CM | POA: Insufficient documentation

## 2011-05-14 DIAGNOSIS — S0180XA Unspecified open wound of other part of head, initial encounter: Secondary | ICD-10-CM | POA: Insufficient documentation

## 2011-05-14 DIAGNOSIS — J45909 Unspecified asthma, uncomplicated: Secondary | ICD-10-CM | POA: Insufficient documentation

## 2011-05-26 ENCOUNTER — Emergency Department (HOSPITAL_COMMUNITY)
Admission: EM | Admit: 2011-05-26 | Discharge: 2011-05-26 | Disposition: A | Discharge status: Home / Self Care | Payer: Self-pay | Attending: Emergency Medicine | Admitting: Emergency Medicine

## 2011-05-26 DIAGNOSIS — Z4802 Encounter for removal of sutures: Secondary | ICD-10-CM | POA: Insufficient documentation

## 2011-05-29 ENCOUNTER — Emergency Department (HOSPITAL_COMMUNITY)
Admission: EM | Admit: 2011-05-29 | Discharge: 2011-05-29 | Disposition: A | Discharge status: Home / Self Care | Payer: Self-pay | Attending: Emergency Medicine | Admitting: Emergency Medicine

## 2011-05-29 DIAGNOSIS — K219 Gastro-esophageal reflux disease without esophagitis: Secondary | ICD-10-CM | POA: Insufficient documentation

## 2011-05-29 DIAGNOSIS — I10 Essential (primary) hypertension: Secondary | ICD-10-CM | POA: Insufficient documentation

## 2011-05-29 DIAGNOSIS — S51809A Unspecified open wound of unspecified forearm, initial encounter: Secondary | ICD-10-CM | POA: Insufficient documentation

## 2011-05-29 DIAGNOSIS — W540XXA Bitten by dog, initial encounter: Secondary | ICD-10-CM | POA: Insufficient documentation

## 2011-05-29 DIAGNOSIS — R569 Unspecified convulsions: Secondary | ICD-10-CM | POA: Insufficient documentation

## 2011-05-29 DIAGNOSIS — M79609 Pain in unspecified limb: Secondary | ICD-10-CM | POA: Insufficient documentation

## 2011-05-29 DIAGNOSIS — J45909 Unspecified asthma, uncomplicated: Secondary | ICD-10-CM | POA: Insufficient documentation

## 2011-06-07 ENCOUNTER — Inpatient Hospital Stay (INDEPENDENT_AMBULATORY_CARE_PROVIDER_SITE_OTHER)
Admission: RE | Admit: 2011-06-07 | Discharge: 2011-06-07 | Disposition: A | Discharge status: Home / Self Care | Payer: Self-pay | Source: Ambulatory Visit | Attending: Emergency Medicine | Admitting: Emergency Medicine

## 2011-06-07 DIAGNOSIS — L738 Other specified follicular disorders: Secondary | ICD-10-CM

## 2011-06-07 DIAGNOSIS — L678 Other hair color and hair shaft abnormalities: Secondary | ICD-10-CM

## 2011-07-18 LAB — DIFFERENTIAL
Basophils Absolute: 0
Lymphocytes Relative: 23
Neutro Abs: 3
Neutrophils Relative %: 61

## 2011-07-18 LAB — BASIC METABOLIC PANEL
BUN: 14
Calcium: 9.5
GFR calc non Af Amer: >60
Glucose, Bld: 95
Sodium: 137

## 2011-07-18 LAB — CBC
Platelets: 188
RDW: 15.3

## 2011-07-18 LAB — POCT CARDIAC MARKERS
CKMB, poc: 5.1
Operator id: 4074
Troponin i, poc: <0.05

## 2011-07-19 LAB — RAPID URINE DRUG SCREEN, HOSP PERFORMED
Amphetamines: NOT DETECTED
Cocaine: POSITIVE — AB
Opiates: NOT DETECTED
Tetrahydrocannabinol: NOT DETECTED

## 2011-07-23 LAB — POCT I-STAT, CHEM 8
BUN: 6
HCT: 43
Sodium: 140
TCO2: 25

## 2011-07-23 LAB — ABO/RH: ABO/RH(D): O POS

## 2011-07-23 LAB — APTT: aPTT: 28

## 2011-07-23 LAB — DIFFERENTIAL
Basophils Absolute: 0
Lymphocytes Relative: 43
Monocytes Absolute: 0.3
Monocytes Relative: 8
Neutro Abs: 1.5 — ABNORMAL LOW

## 2011-07-23 LAB — RAPID URINE DRUG SCREEN, HOSP PERFORMED
Amphetamines: NOT DETECTED
Cocaine: NOT DETECTED
Tetrahydrocannabinol: NOT DETECTED

## 2011-07-23 LAB — TYPE AND SCREEN: ABO/RH(D): O POS

## 2011-07-23 LAB — CBC
MCV: 102.3 — ABNORMAL HIGH
RBC: 3.76 — ABNORMAL LOW
WBC: 3.4 — ABNORMAL LOW

## 2011-08-03 LAB — CBC
HCT: 35.4 — ABNORMAL LOW
Hemoglobin: 12 — ABNORMAL LOW
Hemoglobin: 14.6
MCHC: 33.9
MCHC: 34
MCV: 98.5
RBC: 3.6 — ABNORMAL LOW
RDW: 13.3
RDW: 13.7

## 2011-08-03 LAB — COMPREHENSIVE METABOLIC PANEL
ALT: 25
Alkaline Phosphatase: 46
BUN: 10
BUN: 8
CO2: 27
Calcium: 8.7
Creatinine, Ser: 1.19
GFR calc non Af Amer: >60
Glucose, Bld: 81
Glucose, Bld: 94
Potassium: 3.9
Sodium: 135
Sodium: 138
Total Protein: 5.6 — ABNORMAL LOW
Total Protein: 7.5

## 2011-08-03 LAB — URINALYSIS, ROUTINE W REFLEX MICROSCOPIC
Bilirubin Urine: NEGATIVE
Glucose, UA: NEGATIVE
Ketones, ur: NEGATIVE
Nitrite: NEGATIVE
Nitrite: NEGATIVE
Protein, ur: NEGATIVE
Urobilinogen, UA: 0.2
pH: 6
pH: 6

## 2011-08-03 LAB — URINE MICROSCOPIC-ADD ON

## 2011-08-03 LAB — DIFFERENTIAL
Lymphocytes Relative: 44
Lymphs Abs: 2.2
Monocytes Relative: 7
Neutro Abs: 2.5
Neutrophils Relative %: 49

## 2011-08-03 LAB — LIPASE, BLOOD: Lipase: 21

## 2011-08-03 LAB — RAPID URINE DRUG SCREEN, HOSP PERFORMED
Barbiturates: NOT DETECTED
Opiates: NOT DETECTED
Tetrahydrocannabinol: NOT DETECTED

## 2011-08-03 LAB — ETHANOL: Alcohol, Ethyl (B): 5

## 2011-09-16 ENCOUNTER — Encounter: Payer: Self-pay | Admitting: *Deleted

## 2011-09-16 ENCOUNTER — Other Ambulatory Visit: Payer: Self-pay

## 2011-09-16 ENCOUNTER — Ambulatory Visit (HOSPITAL_COMMUNITY)
Admission: EM | Admit: 2011-09-16 | Discharge: 2011-09-17 | Disposition: A | Discharge status: Hospice | Payer: Self-pay | Attending: Emergency Medicine | Admitting: Emergency Medicine

## 2011-09-16 ENCOUNTER — Encounter (HOSPITAL_COMMUNITY)
Admission: EM | Disposition: A | Discharge status: Hospice | Payer: Self-pay | Source: Home / Self Care | Attending: Emergency Medicine

## 2011-09-16 ENCOUNTER — Emergency Department (HOSPITAL_COMMUNITY): Payer: Self-pay | Admitting: Anesthesiology

## 2011-09-16 ENCOUNTER — Encounter (HOSPITAL_COMMUNITY): Payer: Self-pay | Admitting: Anesthesiology

## 2011-09-16 DIAGNOSIS — X58XXXA Exposure to other specified factors, initial encounter: Secondary | ICD-10-CM | POA: Insufficient documentation

## 2011-09-16 DIAGNOSIS — S39840A Fracture of corpus cavernosum penis, initial encounter: Secondary | ICD-10-CM | POA: Insufficient documentation

## 2011-09-16 HISTORY — PX: PENECTOMY: SHX741

## 2011-09-16 HISTORY — PX: CYSTOSCOPY: SHX5120

## 2011-09-16 LAB — ABO/RH: ABO/RH(D): O POS

## 2011-09-16 LAB — CBC
HCT: 41.7 % (ref 39.0–52.0)
MCH: 33.3 pg (ref 26.0–34.0)
MCHC: 35 g/dL (ref 30.0–36.0)
MCV: 95 fL (ref 78.0–100.0)
Platelets: 176 10*3/uL (ref 150–400)
RDW: 12.7 % (ref 11.5–15.5)

## 2011-09-16 LAB — DIFFERENTIAL
Basophils Absolute: 0 10*3/uL (ref 0.0–0.1)
Eosinophils Absolute: 0 10*3/uL (ref 0.0–0.7)
Eosinophils Relative: 0 % (ref 0–5)
Lymphocytes Relative: 18 % (ref 12–46)
Monocytes Absolute: 0.3 10*3/uL (ref 0.1–1.0)

## 2011-09-16 LAB — BASIC METABOLIC PANEL
CO2: 28 mEq/L (ref 19–32)
Calcium: 9.4 mg/dL (ref 8.4–10.5)
Creatinine, Ser: 1.17 mg/dL (ref 0.50–1.35)
GFR calc non Af Amer: 74 mL/min — ABNORMAL LOW (ref >90)
Sodium: 134 mEq/L — ABNORMAL LOW (ref 135–145)

## 2011-09-16 LAB — TYPE AND SCREEN
ABO/RH(D): O POS
Antibody Screen: NEGATIVE

## 2011-09-16 SURGERY — PENECTOMY
Site: Urethra | Wound class: Clean Contaminated

## 2011-09-16 MED ORDER — CEFAZOLIN SODIUM 1-5 GM-% IV SOLN
INTRAVENOUS | Status: AC
  Filled 2011-09-16: qty 100

## 2011-09-16 MED ORDER — PROPOFOL 10 MG/ML IV EMUL
INTRAVENOUS | Status: DC | PRN
  Administered 2011-09-16: 200 mg via INTRAVENOUS

## 2011-09-16 MED ORDER — HYDROMORPHONE HCL PF 2 MG/ML IJ SOLN
INTRAMUSCULAR | Status: AC
  Filled 2011-09-16: qty 1

## 2011-09-16 MED ORDER — CEFAZOLIN SODIUM-DEXTROSE 2-3 GM-% IV SOLR
2.0000 g | INTRAVENOUS | Status: AC
  Administered 2011-09-16: 2 g via INTRAVENOUS

## 2011-09-16 MED ORDER — SUCCINYLCHOLINE CHLORIDE 20 MG/ML IJ SOLN
INTRAMUSCULAR | Status: DC | PRN
  Administered 2011-09-16: 100 mg via INTRAVENOUS

## 2011-09-16 MED ORDER — LIDOCAINE HCL (CARDIAC) 20 MG/ML IV SOLN
INTRAVENOUS | Status: DC | PRN
  Administered 2011-09-16: 80 mg via INTRAVENOUS

## 2011-09-16 MED ORDER — FENTANYL CITRATE 0.05 MG/ML IJ SOLN
INTRAMUSCULAR | Status: DC | PRN
  Administered 2011-09-16: 50 ug via INTRAVENOUS
  Administered 2011-09-16 (×2): 100 ug via INTRAVENOUS

## 2011-09-16 MED ORDER — LACTATED RINGERS IV SOLN
INTRAVENOUS | Status: DC | PRN
  Administered 2011-09-16: 23:00:00 via INTRAVENOUS

## 2011-09-16 MED ORDER — ONDANSETRON HCL 4 MG/2ML IJ SOLN
4.0000 mg | Freq: Once | INTRAMUSCULAR | Status: AC
  Administered 2011-09-16: 4 mg via INTRAVENOUS
  Filled 2011-09-16: qty 2

## 2011-09-16 MED ORDER — MIDAZOLAM HCL 5 MG/5ML IJ SOLN
INTRAMUSCULAR | Status: DC | PRN
  Administered 2011-09-16: 2 mg via INTRAVENOUS

## 2011-09-16 MED ORDER — HYDROMORPHONE HCL PF 1 MG/ML IJ SOLN
1.0000 mg | Freq: Once | INTRAMUSCULAR | Status: AC
  Administered 2011-09-16: 1 mg via INTRAVENOUS

## 2011-09-16 MED ORDER — BUPIVACAINE HCL (PF) 0.5 % IJ SOLN
INTRAMUSCULAR | Status: AC
  Filled 2011-09-16: qty 30

## 2011-09-16 SURGICAL SUPPLY — 27 items
BANDAGE COBAN STERILE 2 (GAUZE/BANDAGES/DRESSINGS) ×3 IMPLANT
BANDAGE GAUZE ELAST BULKY 4 IN (GAUZE/BANDAGES/DRESSINGS) ×3 IMPLANT
CATH FOLEY 2WAY SLVR  5CC 16FR (CATHETERS) ×1
CATH FOLEY 2WAY SLVR 5CC 16FR (CATHETERS) ×2 IMPLANT
CATH ROBINSON RED A/P 18FR (CATHETERS) ×3 IMPLANT
CLOTH BEACON ORANGE TIMEOUT ST (SAFETY) ×3 IMPLANT
COVER SURGICAL LIGHT HANDLE (MISCELLANEOUS) ×3 IMPLANT
DRAPE LAPAROTOMY T 102X78X121 (DRAPES) ×3 IMPLANT
GLOVE BIOGEL M 8.0 STRL (GLOVE) ×3 IMPLANT
GOWN PREVENTION PLUS XLARGE (GOWN DISPOSABLE) ×3 IMPLANT
GOWN STRL NON-REIN LRG LVL3 (GOWN DISPOSABLE) ×3 IMPLANT
KIT BASIN OR (CUSTOM PROCEDURE TRAY) ×3 IMPLANT
NS IRRIG 1000ML POUR BTL (IV SOLUTION) ×3 IMPLANT
PACK GENERAL/GYN (CUSTOM PROCEDURE TRAY) ×3 IMPLANT
PLUG CATH AND CAP STER (CATHETERS) ×3 IMPLANT
SET CYSTO W/LG BORE CLAMP LF (SET/KITS/TRAYS/PACK) ×3 IMPLANT
SOL PREP POV-IOD 16OZ 10% (MISCELLANEOUS) ×3 IMPLANT
SOL PREP PROV IODINE SCRUB 4OZ (MISCELLANEOUS) ×3 IMPLANT
SPONGE GAUZE 4X4 12PLY (GAUZE/BANDAGES/DRESSINGS) ×3 IMPLANT
SUT CHROMIC 3 0 SH 27 (SUTURE) ×6 IMPLANT
SUT VIC AB 4-0 RB1 27 (SUTURE) ×1
SUT VIC AB 4-0 RB1 27XBRD (SUTURE) ×2 IMPLANT
SYR CONTROL 10ML LL (SYRINGE) ×3 IMPLANT
SYRINGE 10CC LL (SYRINGE) ×3 IMPLANT
TOWEL NATURAL 6PK STERILE (DISPOSABLE) ×3 IMPLANT
TOWEL OR NON WOVEN STRL DISP B (DISPOSABLE) ×3 IMPLANT
WATER STERILE IRR 1000ML UROMA (IV SOLUTION) ×3 IMPLANT
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
## 2011-09-16 NOTE — Anesthesia Preprocedure Evaluation (Addendum)
Anesthesia Evaluation  Patient identified by MRN, date of birth, ID band Patient awake    Reviewed: Allergy & Precautions, H&P , NPO status , Patient's Chart, lab work & pertinent test results, reviewed documented beta blocker date and time   Airway Mallampati: II TM Distance: >3 FB Neck ROM: Full    Dental   Pulmonary COPD COPD inhaler, Current Smoker,    + decreased breath sounds      Cardiovascular hypertension, Regular Normal HTN, no Rx   Neuro/Psych ETOHNegative Neurological ROS     GI/Hepatic Neg liver ROS, GERD-  Poorly Controlled,  Endo/Other  Negative Endocrine ROS  Renal/GU negative Renal ROS  Genitourinary negative   Musculoskeletal negative musculoskeletal ROS (+)   Abdominal   Peds negative pediatric ROS (+)  Hematology negative hematology ROS (+)   Anesthesia Other Findings Poor dentition,broken tooth  Reproductive/Obstetrics negative OB ROS                         Anesthesia Physical Anesthesia Plan  ASA: II and Emergent  Anesthesia Plan: General   Post-op Pain Management:    Induction: Intravenous, Rapid sequence and Cricoid pressure planned  Airway Management Planned: Oral ETT  Additional Equipment:   Intra-op Plan:   Post-operative Plan: Extubation in OR  Informed Consent: I have reviewed the patients History and Physical, chart, labs and discussed the procedure including the risks, benefits and alternatives for the proposed anesthesia with the patient or authorized representative who has indicated his/her understanding and acceptance.     Plan Discussed with: CRNA and Surgeon  Anesthesia Plan Comments:         Anesthesia Quick Evaluation
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
## 2011-09-17 MED ORDER — BUPIVACAINE HCL (PF) 0.5 % IJ SOLN
INTRAMUSCULAR | Status: DC | PRN
  Administered 2011-09-17: 20 mL

## 2011-09-17 MED ORDER — BACITRACIN ZINC 500 UNIT/GM EX OINT
TOPICAL_OINTMENT | CUTANEOUS | Status: AC
  Filled 2011-09-17: qty 15

## 2011-09-17 MED ORDER — HYDROCODONE-ACETAMINOPHEN 7.5-325 MG PO TABS: 1.0000 | ORAL_TABLET | ORAL | Status: AC | PRN

## 2011-09-17 MED ORDER — HYDROMORPHONE HCL PF 1 MG/ML IJ SOLN: 0.2500 mg | INTRAMUSCULAR | Status: DC | PRN

## 2011-09-17 MED ORDER — ONDANSETRON HCL 4 MG/2ML IJ SOLN
INTRAMUSCULAR | Status: DC | PRN
  Administered 2011-09-17: 4 mg via INTRAVENOUS

## 2011-09-17 MED ORDER — DOXYCYCLINE HYCLATE 50 MG PO CAPS: 100.0000 mg | ORAL_CAPSULE | Freq: Two times a day (BID) | ORAL | Status: AC

## 2011-09-17 MED ORDER — PROMETHAZINE HCL 25 MG/ML IJ SOLN
6.2500 mg | INTRAMUSCULAR | Status: DC | PRN
  Filled 2011-09-17: qty 1

## 2011-09-17 NOTE — ED Notes (Signed)
PT went to surgery/

## 2011-09-17 NOTE — Anesthesia Postprocedure Evaluation (Signed)
  Anesthesia Post-op Note  Patient: David Benson  Procedure(s) Performed:  CYSTOSCOPY FLEXIBLE; PENECTOMY - exploration and repair of fractured penis  Patient Location: PACU  Anesthesia Type: General  Level of Consciousness: oriented and sedated  Airway and Oxygen Therapy: Patient Spontanous Breathing and Patient connected to nasal cannula oxygen  Post-op Pain: mild  Post-op Assessment: Post-op Vital signs reviewed, Patient's Cardiovascular Status Stable, Respiratory Function Stable and Patent Airway  Post-op Vital Signs: stable  Complications: No apparent anesthesia complications

## 2011-09-17 NOTE — ED Provider Notes (Signed)
Medical screening examination/treatment/procedure(s) were performed by non-physician practitioner and as supervising physician I was immediately available for consultation/collaboration.   Damarius Karnes M Italo Banton, MD 09/17/11 0043 

## 2011-09-17 NOTE — Transfer of Care (Signed)
Immediate Anesthesia Transfer of Care Note  Patient: David Benson  Procedure(s) Performed:  CYSTOSCOPY FLEXIBLE; PENECTOMY - exploration and repair of fractured penis  Patient Location: PACU  Anesthesia Type: General  Level of Consciousness: awake, sedated and patient cooperative  Airway & Oxygen Therapy: Patient Spontanous Breathing and Patient connected to face mask oxygen  Post-op Assessment: Report given to PACU RN and Post -op Vital signs reviewed and stable  Post vital signs: Reviewed and stable  Complications: No apparent anesthesia complications

## 2011-09-17 NOTE — Op Note (Signed)
PATIENT:  David Benson   PRE-OPERATIVE DIAGNOSIS: Penile fracture  POST-OPERATIVE DIAGNOSIS:  Same  PROCEDURE:  Procedure(s): 1. Cystoscopy 2. Penile exploration and repair of left corpus cavernosal disruption.  SURGEON:  Surgeon(s): Garnett Farm  ANESTHESIA:  General  EBL:  Minimal  DRAINS: None  LOCAL MEDICATIONS USED:  None  SPECIMEN:  None  DISPOSITION OF SPECIMEN:  N/A  Description of procedure: After informed consent the patient was taken to the major operating room and placed in the table in the supine position. His genitalia was then sterilely prepped and draped. An official timeout was then performed.  Flexible cystoscopy was then performed using the 17 French flexible cystoscope. It was passed under direct vision into the urethral meatus and down to the urethra which was noted be normal throughout its length with no evidence of disruption, tear or other abnormality. The prostatic urethra appeared normal. The bladder was then entered and fully inspected and noted to be free of any tumor stones or inflammatory lesions. Ureteral orifices were of normal configuration and position.  The cystoscope was then removed and a 16 French Foley catheter was inserted in the bladder the bladder was fully drained. I began by making a circumcising incision circumferentially approximately 4 mm from the corona and then dissected the shaft skin back to approximately the mid shaft region using Metzenbaum scissors. This exposed both corpus cavernosum and the corpus spongiosum and I found a single transverse disruption of the left corpus cavernosum at approximately the junction between the middle and distal thirds of the penile shaft. This was cleared of surrounding tissue and I then closed the defect with running 4-0 Vicryl suture. This stopped the bleeding. I then closed the overlying tissue using a running 4-0 Vicryl suture for a second layer of closure. There was a great deal of hematoma  within the penile tissue and I felt there was no need to try to excise this as it would only be excising normal tissue. I therefore elected to reapproximate the skin edges. This was performed by first using 3-0 chromic to close the frenulum in the midline and then I approximated the shaft skin at the 6:00 and 12:00 positions. I then closed the skin in a running fashion on each side. Triple antibiotic ointment, 4 x 4 gauze and Kerlix were then applied as well as a loose Coban and told this in position and the Foley catheter was removed. The patient tolerated procedure well and there were no intraoperative complications.  PLAN OF CARE: Discharge to home after PACU  PATIENT DISPOSITION:  PACU - hemodynamically stable.

## 2011-09-21 ENCOUNTER — Encounter (HOSPITAL_COMMUNITY): Payer: Self-pay | Admitting: Urology

## 2012-05-12 ENCOUNTER — Encounter (HOSPITAL_COMMUNITY): Payer: Self-pay | Admitting: *Deleted

## 2012-05-12 ENCOUNTER — Emergency Department (INDEPENDENT_AMBULATORY_CARE_PROVIDER_SITE_OTHER): Payer: 59

## 2012-05-12 ENCOUNTER — Emergency Department (INDEPENDENT_AMBULATORY_CARE_PROVIDER_SITE_OTHER)
Admission: EM | Admit: 2012-05-12 | Discharge: 2012-05-12 | Disposition: A | Discharge status: Other Acute Inpatient Hospital | Payer: 59 | Source: Home / Self Care | Attending: Emergency Medicine | Admitting: Emergency Medicine

## 2012-05-12 ENCOUNTER — Inpatient Hospital Stay (HOSPITAL_COMMUNITY)
Admission: EM | Admit: 2012-05-12 | Discharge: 2012-05-19 | DRG: 194 | Disposition: A | Discharge status: Home / Self Care | Payer: MEDICAID | Attending: Family Medicine | Admitting: Family Medicine

## 2012-05-12 ENCOUNTER — Encounter (HOSPITAL_COMMUNITY): Payer: Self-pay

## 2012-05-12 DIAGNOSIS — J449 Chronic obstructive pulmonary disease, unspecified: Secondary | ICD-10-CM | POA: Diagnosis present

## 2012-05-12 DIAGNOSIS — F1210 Cannabis abuse, uncomplicated: Secondary | ICD-10-CM | POA: Diagnosis present

## 2012-05-12 DIAGNOSIS — K219 Gastro-esophageal reflux disease without esophagitis: Secondary | ICD-10-CM | POA: Diagnosis present

## 2012-05-12 DIAGNOSIS — J189 Pneumonia, unspecified organism: Principal | ICD-10-CM | POA: Diagnosis present

## 2012-05-12 DIAGNOSIS — J441 Chronic obstructive pulmonary disease with (acute) exacerbation: Secondary | ICD-10-CM | POA: Diagnosis present

## 2012-05-12 DIAGNOSIS — R0902 Hypoxemia: Secondary | ICD-10-CM | POA: Diagnosis present

## 2012-05-12 DIAGNOSIS — R209 Unspecified disturbances of skin sensation: Secondary | ICD-10-CM | POA: Diagnosis present

## 2012-05-12 DIAGNOSIS — J45909 Unspecified asthma, uncomplicated: Secondary | ICD-10-CM | POA: Diagnosis present

## 2012-05-12 DIAGNOSIS — I501 Left ventricular failure: Secondary | ICD-10-CM

## 2012-05-12 DIAGNOSIS — F329 Major depressive disorder, single episode, unspecified: Secondary | ICD-10-CM

## 2012-05-12 DIAGNOSIS — F1410 Cocaine abuse, uncomplicated: Secondary | ICD-10-CM | POA: Diagnosis present

## 2012-05-12 DIAGNOSIS — J45901 Unspecified asthma with (acute) exacerbation: Secondary | ICD-10-CM | POA: Diagnosis present

## 2012-05-12 DIAGNOSIS — F1010 Alcohol abuse, uncomplicated: Secondary | ICD-10-CM | POA: Diagnosis present

## 2012-05-12 DIAGNOSIS — I498 Other specified cardiac arrhythmias: Secondary | ICD-10-CM | POA: Diagnosis present

## 2012-05-12 DIAGNOSIS — R918 Other nonspecific abnormal finding of lung field: Secondary | ICD-10-CM | POA: Diagnosis present

## 2012-05-12 DIAGNOSIS — J159 Unspecified bacterial pneumonia: Secondary | ICD-10-CM

## 2012-05-12 DIAGNOSIS — F1910 Other psychoactive substance abuse, uncomplicated: Secondary | ICD-10-CM | POA: Diagnosis present

## 2012-05-12 DIAGNOSIS — F3289 Other specified depressive episodes: Secondary | ICD-10-CM | POA: Diagnosis present

## 2012-05-12 DIAGNOSIS — F17200 Nicotine dependence, unspecified, uncomplicated: Secondary | ICD-10-CM | POA: Diagnosis present

## 2012-05-12 DIAGNOSIS — R197 Diarrhea, unspecified: Secondary | ICD-10-CM | POA: Diagnosis present

## 2012-05-12 DIAGNOSIS — M1990 Unspecified osteoarthritis, unspecified site: Secondary | ICD-10-CM | POA: Diagnosis present

## 2012-05-12 HISTORY — DX: Pneumonia, unspecified organism: J18.9

## 2012-05-12 HISTORY — DX: Unspecified asthma, uncomplicated: J45.909

## 2012-05-12 LAB — CBC
HCT: 44.2 % (ref 39.0–52.0)
Hemoglobin: 15.2 g/dL (ref 13.0–17.0)
MCH: 34.2 pg — ABNORMAL HIGH (ref 26.0–34.0)
MCHC: 34.4 g/dL (ref 30.0–36.0)
MCV: 99.5 fL (ref 78.0–100.0)
Platelets: 175 10*3/uL (ref 150–400)
RBC: 4.44 MIL/uL (ref 4.22–5.81)
RDW: 13.3 % (ref 11.5–15.5)
WBC: 5.8 10*3/uL (ref 4.0–10.5)

## 2012-05-12 LAB — BASIC METABOLIC PANEL
BUN: 10 mg/dL (ref 6–23)
CO2: 20 mEq/L (ref 19–32)
Calcium: 9.2 mg/dL (ref 8.4–10.5)
Chloride: 102 mEq/L (ref 96–112)
Creatinine, Ser: 1.06 mg/dL (ref 0.50–1.35)
GFR calc Af Amer: >90 mL/min (ref >90)
GFR calc non Af Amer: 84 mL/min — ABNORMAL LOW (ref >90)
Glucose, Bld: 107 mg/dL — ABNORMAL HIGH (ref 70–99)
Potassium: 3.7 mEq/L (ref 3.5–5.1)
Sodium: 138 mEq/L (ref 135–145)

## 2012-05-12 LAB — PRO B NATRIURETIC PEPTIDE: Pro B Natriuretic peptide (BNP): 42.4 pg/mL (ref 0–125)

## 2012-05-12 MED ORDER — ALBUTEROL SULFATE (5 MG/ML) 0.5% IN NEBU
5.0000 mg | INHALATION_SOLUTION | Freq: Once | RESPIRATORY_TRACT | Status: AC
  Administered 2012-05-12: 5 mg via RESPIRATORY_TRACT

## 2012-05-12 MED ORDER — DEXTROSE 5 % IV SOLN
500.0000 mg | Freq: Once | INTRAVENOUS | Status: AC
  Administered 2012-05-12: 500 mg via INTRAVENOUS
  Filled 2012-05-12: qty 500

## 2012-05-12 MED ORDER — ALBUTEROL SULFATE (5 MG/ML) 0.5% IN NEBU
5.0000 mg | INHALATION_SOLUTION | Freq: Once | RESPIRATORY_TRACT | Status: AC
  Administered 2012-05-13: 5 mg via RESPIRATORY_TRACT
  Filled 2012-05-12: qty 40

## 2012-05-12 MED ORDER — CEFTRIAXONE SODIUM 1 G IJ SOLR
1.0000 g | Freq: Once | INTRAMUSCULAR | Status: AC
  Administered 2012-05-12: 1 g via INTRAMUSCULAR

## 2012-05-12 MED ORDER — HYDROMORPHONE HCL PF 1 MG/ML IJ SOLN
1.0000 mg | Freq: Once | INTRAMUSCULAR | Status: AC
  Administered 2012-05-12: 1 mg via INTRAVENOUS
  Filled 2012-05-12: qty 1

## 2012-05-12 MED ORDER — DEXTROSE 5 % IV SOLN
1.0000 g | Freq: Once | INTRAVENOUS | Status: AC
  Administered 2012-05-12: 1 g via INTRAVENOUS
  Filled 2012-05-12: qty 10

## 2012-05-12 MED ORDER — SODIUM CHLORIDE 0.9 % IV BOLUS (SEPSIS)
1000.0000 mL | Freq: Once | INTRAVENOUS | Status: AC
  Administered 2012-05-12: 1000 mL via INTRAVENOUS

## 2012-05-12 MED ORDER — ALBUTEROL SULFATE (5 MG/ML) 0.5% IN NEBU
5.0000 mg | INHALATION_SOLUTION | Freq: Once | RESPIRATORY_TRACT | Status: AC
  Administered 2012-05-12: 5 mg via RESPIRATORY_TRACT
  Filled 2012-05-12: qty 1

## 2012-05-12 MED ORDER — IPRATROPIUM BROMIDE 0.02 % IN SOLN
0.5000 mg | Freq: Once | RESPIRATORY_TRACT | Status: AC
  Administered 2012-05-12: 0.5 mg via RESPIRATORY_TRACT
  Filled 2012-05-12: qty 2.5

## 2012-05-12 MED ORDER — GUAIFENESIN-CODEINE 100-10 MG/5ML PO SOLN
5.0000 mL | Freq: Once | ORAL | Status: AC
  Administered 2012-05-12: 5 mL via ORAL
  Filled 2012-05-12: qty 5

## 2012-05-12 MED ORDER — IPRATROPIUM BROMIDE 0.02 % IN SOLN
0.5000 mg | Freq: Once | RESPIRATORY_TRACT | Status: AC
  Administered 2012-05-12: 0.5 mg via RESPIRATORY_TRACT

## 2012-05-12 MED ORDER — ALBUTEROL SULFATE HFA 108 (90 BASE) MCG/ACT IN AERS: 2.0000 | INHALATION_SPRAY | Freq: Once | RESPIRATORY_TRACT | Status: DC

## 2012-05-12 MED ORDER — CEFTRIAXONE SODIUM 1 G IJ SOLR
INTRAMUSCULAR | Status: AC
  Filled 2012-05-12: qty 10

## 2012-05-12 MED ORDER — ONDANSETRON HCL 4 MG/2ML IJ SOLN
4.0000 mg | Freq: Once | INTRAMUSCULAR | Status: AC
  Administered 2012-05-12: 4 mg via INTRAVENOUS
  Filled 2012-05-12: qty 2

## 2012-05-12 MED ORDER — PREDNISONE 20 MG PO TABS
60.0000 mg | ORAL_TABLET | Freq: Once | ORAL | Status: AC
  Administered 2012-05-12: 60 mg via ORAL
  Filled 2012-05-12: qty 3

## 2012-05-12 MED ORDER — LIDOCAINE HCL (PF) 1 % IJ SOLN
INTRAMUSCULAR | Status: AC
  Filled 2012-05-12: qty 5

## 2012-05-12 MED ORDER — ALBUTEROL SULFATE (5 MG/ML) 0.5% IN NEBU
INHALATION_SOLUTION | RESPIRATORY_TRACT | Status: AC
  Filled 2012-05-12: qty 1

## 2012-05-12 MED ORDER — PREDNISONE 20 MG PO TABS
40.0000 mg | ORAL_TABLET | Freq: Once | ORAL | Status: AC
  Administered 2012-05-12: 40 mg via ORAL

## 2012-05-12 MED ORDER — PREDNISONE 20 MG PO TABS
ORAL_TABLET | ORAL | Status: AC
  Filled 2012-05-12: qty 2

## 2012-05-12 MED ORDER — IBUPROFEN 400 MG PO TABS
400.0000 mg | ORAL_TABLET | Freq: Once | ORAL | Status: AC
Start: 2012-05-12 — End: 2012-05-12
  Administered 2012-05-12: 400 mg via ORAL
  Filled 2012-05-12: qty 1

## 2012-05-12 NOTE — ED Notes (Signed)
Patient is resting comfortably. 

## 2012-05-12 NOTE — ED Notes (Signed)
Pt c/o fever, cough, CP x 1 week. Was seen at urgent care today, dx with pneumonia but sent here for further work up.

## 2012-05-12 NOTE — ED Notes (Signed)
Called respiratory to administer nebulizer treatment; respiratory tech states that they are on their way. 
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
## 2012-05-12 NOTE — ED Notes (Signed)
Developed asthma age 46; not used a MDI for past 6 months; wheezing auscultated all fields; speaking in short sentences
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
## 2012-05-12 NOTE — ED Notes (Signed)
[phone] wife number

## 2012-05-12 NOTE — ED Notes (Signed)
Respiratory tech at bedside

## 2012-05-13 ENCOUNTER — Encounter (HOSPITAL_COMMUNITY): Payer: Self-pay | Admitting: Radiology

## 2012-05-13 ENCOUNTER — Inpatient Hospital Stay (HOSPITAL_COMMUNITY): Payer: Self-pay

## 2012-05-13 LAB — RAPID URINE DRUG SCREEN, HOSP PERFORMED
Amphetamines: NOT DETECTED
Barbiturates: NOT DETECTED
Benzodiazepines: NOT DETECTED
Cocaine: POSITIVE — AB
Opiates: NOT DETECTED
Tetrahydrocannabinol: POSITIVE — AB

## 2012-05-13 LAB — CARDIAC PANEL(CRET KIN+CKTOT+MB+TROPI)
CK, MB: 4.8 ng/mL — ABNORMAL HIGH (ref 0.3–4.0)
Relative Index: 1.2 (ref 0.0–2.5)
Total CK: 413 U/L — ABNORMAL HIGH (ref 7–232)
Troponin I: <0.3 ng/mL (ref <0.30)

## 2012-05-13 LAB — COMPREHENSIVE METABOLIC PANEL
ALT: 17 U/L (ref 0–53)
AST: 22 U/L (ref 0–37)
Albumin: 2.9 g/dL — ABNORMAL LOW (ref 3.5–5.2)
Alkaline Phosphatase: 60 U/L (ref 39–117)
BUN: 10 mg/dL (ref 6–23)
CO2: 22 mEq/L (ref 19–32)
Calcium: 9.1 mg/dL (ref 8.4–10.5)
Chloride: 105 mEq/L (ref 96–112)
Creatinine, Ser: 1.18 mg/dL (ref 0.50–1.35)
GFR calc Af Amer: 85 mL/min — ABNORMAL LOW (ref >90)
GFR calc non Af Amer: 74 mL/min — ABNORMAL LOW (ref >90)
Glucose, Bld: 196 mg/dL — ABNORMAL HIGH (ref 70–99)
Potassium: 4.1 mEq/L (ref 3.5–5.1)
Sodium: 137 mEq/L (ref 135–145)
Total Bilirubin: 0.2 mg/dL — ABNORMAL LOW (ref 0.3–1.2)
Total Protein: 6.1 g/dL (ref 6.0–8.3)

## 2012-05-13 LAB — CBC
HCT: 37.3 % — ABNORMAL LOW (ref 39.0–52.0)
Hemoglobin: 12.5 g/dL — ABNORMAL LOW (ref 13.0–17.0)
MCH: 34 pg (ref 26.0–34.0)
MCHC: 33.5 g/dL (ref 30.0–36.0)
MCV: 101.4 fL — ABNORMAL HIGH (ref 78.0–100.0)
Platelets: 164 10*3/uL (ref 150–400)
RBC: 3.68 MIL/uL — ABNORMAL LOW (ref 4.22–5.81)
RDW: 13.5 % (ref 11.5–15.5)
WBC: 5.4 10*3/uL (ref 4.0–10.5)

## 2012-05-13 LAB — LEGIONELLA ANTIGEN, URINE: Legionella Antigen, Urine: NEGATIVE

## 2012-05-13 MED ORDER — ONDANSETRON HCL 4 MG/2ML IJ SOLN: 4.0000 mg | Freq: Four times a day (QID) | INTRAMUSCULAR | Status: DC | PRN

## 2012-05-13 MED ORDER — IPRATROPIUM BROMIDE 0.02 % IN SOLN
0.5000 mg | RESPIRATORY_TRACT | Status: DC
  Administered 2012-05-13 – 2012-05-14 (×8): 0.5 mg via RESPIRATORY_TRACT
  Filled 2012-05-13 (×8): qty 2.5

## 2012-05-13 MED ORDER — ONDANSETRON HCL 4 MG PO TABS: 4.0000 mg | ORAL_TABLET | Freq: Four times a day (QID) | ORAL | Status: DC | PRN

## 2012-05-13 MED ORDER — LORAZEPAM 2 MG/ML IJ SOLN
1.0000 mg | Freq: Four times a day (QID) | INTRAMUSCULAR | Status: DC | PRN
  Administered 2012-05-13: 1 mg via INTRAVENOUS
  Filled 2012-05-13: qty 1

## 2012-05-13 MED ORDER — PANTOPRAZOLE SODIUM 40 MG PO TBEC
40.0000 mg | DELAYED_RELEASE_TABLET | Freq: Every day | ORAL | Status: DC
  Administered 2012-05-13 – 2012-05-14 (×2): 40 mg via ORAL
  Filled 2012-05-13 (×2): qty 1

## 2012-05-13 MED ORDER — IOHEXOL 350 MG/ML SOLN
75.0000 mL | Freq: Once | INTRAVENOUS | Status: AC | PRN
  Administered 2012-05-13: 75 mL via INTRAVENOUS

## 2012-05-13 MED ORDER — ZOLPIDEM TARTRATE 5 MG PO TABS
5.0000 mg | ORAL_TABLET | Freq: Every day | ORAL | Status: DC
  Administered 2012-05-13 – 2012-05-19 (×6): 5 mg via ORAL
  Filled 2012-05-13 (×5): qty 1

## 2012-05-13 MED ORDER — IBUPROFEN 800 MG PO TABS
800.0000 mg | ORAL_TABLET | Freq: Three times a day (TID) | ORAL | Status: DC
  Administered 2012-05-13 – 2012-05-14 (×4): 800 mg via ORAL
  Filled 2012-05-13 (×6): qty 1

## 2012-05-13 MED ORDER — LORAZEPAM 1 MG PO TABS
1.0000 mg | ORAL_TABLET | Freq: Four times a day (QID) | ORAL | Status: DC | PRN
  Administered 2012-05-13: 1 mg via ORAL
  Filled 2012-05-13: qty 1

## 2012-05-13 MED ORDER — PNEUMOCOCCAL VAC POLYVALENT 25 MCG/0.5ML IJ INJ
0.5000 mL | INJECTION | INTRAMUSCULAR | Status: AC
  Administered 2012-05-14: 0.5 mL via INTRAMUSCULAR
  Filled 2012-05-13: qty 0.5

## 2012-05-13 MED ORDER — GUAIFENESIN ER 600 MG PO TB12
600.0000 mg | ORAL_TABLET | Freq: Two times a day (BID) | ORAL | Status: DC
  Administered 2012-05-13 – 2012-05-14 (×5): 600 mg via ORAL
  Filled 2012-05-13 (×7): qty 1

## 2012-05-13 MED ORDER — THIAMINE HCL 100 MG/ML IJ SOLN
100.0000 mg | Freq: Every day | INTRAMUSCULAR | Status: DC
  Filled 2012-05-13 (×7): qty 1

## 2012-05-13 MED ORDER — HYDROCODONE-ACETAMINOPHEN 5-325 MG PO TABS
2.0000 | ORAL_TABLET | Freq: Four times a day (QID) | ORAL | Status: DC | PRN
  Administered 2012-05-13 – 2012-05-19 (×17): 2 via ORAL
  Filled 2012-05-13 (×18): qty 2

## 2012-05-13 MED ORDER — DEXTROSE 5 % IV SOLN
1.0000 g | INTRAVENOUS | Status: DC
  Administered 2012-05-13: 1 g via INTRAVENOUS
  Filled 2012-05-13 (×2): qty 10

## 2012-05-13 MED ORDER — VITAMIN B-1 100 MG PO TABS
100.0000 mg | ORAL_TABLET | Freq: Every day | ORAL | Status: DC
  Administered 2012-05-13 – 2012-05-19 (×7): 100 mg via ORAL
  Filled 2012-05-13 (×7): qty 1

## 2012-05-13 MED ORDER — SODIUM CHLORIDE 0.9 % IV SOLN
INTRAVENOUS | Status: DC
  Administered 2012-05-13 – 2012-05-14 (×3): via INTRAVENOUS

## 2012-05-13 MED ORDER — ADULT MULTIVITAMIN W/MINERALS CH
1.0000 | ORAL_TABLET | Freq: Every day | ORAL | Status: DC
Start: 2012-05-13 — End: 2012-05-19
  Administered 2012-05-13 – 2012-05-19 (×7): 1 via ORAL
  Filled 2012-05-13 (×7): qty 1

## 2012-05-13 MED ORDER — ENOXAPARIN SODIUM 40 MG/0.4ML ~~LOC~~ SOLN
40.0000 mg | SUBCUTANEOUS | Status: DC
  Administered 2012-05-13 – 2012-05-19 (×7): 40 mg via SUBCUTANEOUS
  Filled 2012-05-13 (×7): qty 0.4

## 2012-05-13 MED ORDER — GUAIFENESIN-DM 100-10 MG/5ML PO SYRP
5.0000 mL | ORAL_SOLUTION | ORAL | Status: DC | PRN
  Administered 2012-05-13 – 2012-05-14 (×3): 5 mL via ORAL
  Filled 2012-05-13 (×4): qty 5

## 2012-05-13 MED ORDER — ALBUTEROL SULFATE (5 MG/ML) 0.5% IN NEBU
2.5000 mg | INHALATION_SOLUTION | RESPIRATORY_TRACT | Status: DC | PRN
  Administered 2012-05-15 – 2012-05-17 (×3): 2.5 mg via RESPIRATORY_TRACT
  Filled 2012-05-13 (×8): qty 0.5

## 2012-05-13 MED ORDER — ENSURE COMPLETE PO LIQD
237.0000 mL | Freq: Two times a day (BID) | ORAL | Status: DC
  Administered 2012-05-13 – 2012-05-14 (×2): 237 mL via ORAL

## 2012-05-13 MED ORDER — FOLIC ACID 1 MG PO TABS
1.0000 mg | ORAL_TABLET | Freq: Every day | ORAL | Status: DC
  Administered 2012-05-13 – 2012-05-19 (×6): 1 mg via ORAL
  Filled 2012-05-13 (×7): qty 1

## 2012-05-13 MED ORDER — ALBUTEROL SULFATE (5 MG/ML) 0.5% IN NEBU
2.5000 mg | INHALATION_SOLUTION | RESPIRATORY_TRACT | Status: DC
  Administered 2012-05-13 – 2012-05-14 (×8): 2.5 mg via RESPIRATORY_TRACT
  Filled 2012-05-13 (×6): qty 0.5

## 2012-05-13 MED ORDER — DEXTROSE 5 % IV SOLN
500.0000 mg | INTRAVENOUS | Status: DC
  Administered 2012-05-13: 500 mg via INTRAVENOUS
  Filled 2012-05-13 (×2): qty 500

## 2012-05-13 MED ORDER — MORPHINE SULFATE 2 MG/ML IJ SOLN
2.0000 mg | INTRAMUSCULAR | Status: DC | PRN
  Administered 2012-05-13 (×4): 2 mg via INTRAVENOUS
  Filled 2012-05-13 (×4): qty 1

## 2012-05-13 MED ORDER — PREDNISONE 50 MG PO TABS
50.0000 mg | ORAL_TABLET | Freq: Every day | ORAL | Status: DC
  Administered 2012-05-13 – 2012-05-14 (×2): 50 mg via ORAL
  Filled 2012-05-13 (×4): qty 1

## 2012-05-13 NOTE — H&P (Signed)
[facility] Attending Admission Note: David Levy MD [phone] pager office [phone] I  have seen and examined this patient this morning before rounds, reviewed their chart. I have discussed this patient with the resident. I agree with the resident's findings, assessment and care plan. Significant decreased breath sounds right > left bases. Given his x ray findings, sarcoid is a possible underlying contributor to what appears to be CAP. I reviewed an old film and B hilar LAD present previously. He is unaware of any such diagnosis. He is requesting better pain management of his MSK chest pain from constant coughing, but otherwise is in no acute distress. Will get CT chest to further clarify pneumonia.
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
## 2012-05-13 NOTE — Clinical Social Work Psychosocial (Signed)
     Clinical Social Work Department BRIEF PSYCHOSOCIAL ASSESSMENT 05/13/2012  Patient:  David Benson, David Benson     Account Number:  [id number]     Admit date:  05/12/2012  Clinical Social Worker:  Lourdes Sledge  Date/Time:  05/13/2012 04:00 PM  Referred by:  Physician  Date Referred:  05/13/2012 Referred for  Substance Abuse   Other Referral:   Interview type:  Patient Other interview type:   CSW also completed assessment with pt wife David Benson [phone].    PSYCHOSOCIAL DATA Living Status:  WIFE Admitted from facility:   Level of care:   Primary support name:  David Benson [phone] Primary support relationship to patient:  SPOUSE Degree of support available:   Pt wife actively involved in pt care.    CURRENT CONCERNS Current Concerns  Substance Abuse   Other Concerns:    SOCIAL WORK ASSESSMENT / PLAN CSW received a referral for substance abuse however informed pt denied using cocaine and maijuana that drug screen came back positive.    CSW visited pt room and observed pt wife in room desiring to be present during assessment. CSW did not inform pt or spouse that CSW wanting to do substance abuse assessment as pt wife did not want to leave the room. CSW previously informed that pt would most likely not be interested in discussing history however CSW referral was still placed. CSW explored pt and family living situation and whether family had any CSW needs. Pt and wife stated they would like pt to have a PCP as he has not been able to follow up with a doctor. CSW informed pt and wife that CSW would inform the FMTS team to see if they could potentially accept pt in their practice. CSW informed pt and family that this was not confirmed however CSW would speak to the team. CSW also informed pt and spouse that CSW would contact the financial counselor to see if a Medicaid application could be completed for pt. Pt and spouse appreciative of CSW asisstance and had not further  concerns.   Assessment/plan status:  Information/Referral to [facility] Other assessment/ plan:   Information/referral to community resources:   CSW contacted the financial counselor to assist with completing a Medicaid application with pt. CSW also informed FMTS that pt does not have a PCP and would like, if possible to followed at the [facility]. CSW also provided pt and spouse with CSW contact information.    PATIENTS/FAMILYS RESPONSE TO PLAN OF CARE: Pt laying in bed, alert and oriented however had a difficult time providing CSW with information as he proceeded to cough a lot. CSW given consent to speak to wife who told CSW several times that CSW could speak to her because she was the CSW. CSW informed wife that CSW first prefers to complet assessment with pt however if pt unable to provide information then CSW would have no problem speaking to her. Pt and wife both reported that pt lives in [place], have stable housing however are interested in having a PCP for pt. CSW to follow up with FMTS Team and inform pt and wife of update. CSW to follow.
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
## 2012-05-13 NOTE — ED Notes (Signed)
Calling report now. 

## 2012-05-14 DIAGNOSIS — J4489 Other specified chronic obstructive pulmonary disease: Secondary | ICD-10-CM

## 2012-05-14 DIAGNOSIS — R0902 Hypoxemia: Secondary | ICD-10-CM | POA: Diagnosis present

## 2012-05-14 DIAGNOSIS — J449 Chronic obstructive pulmonary disease, unspecified: Secondary | ICD-10-CM

## 2012-05-14 DIAGNOSIS — J189 Pneumonia, unspecified organism: Secondary | ICD-10-CM

## 2012-05-14 DIAGNOSIS — R918 Other nonspecific abnormal finding of lung field: Secondary | ICD-10-CM

## 2012-05-14 HISTORY — DX: Pneumonia, unspecified organism: J18.9

## 2012-05-14 LAB — BASIC METABOLIC PANEL
CO2: 25 mEq/L (ref 19–32)
Calcium: 8.7 mg/dL (ref 8.4–10.5)
Creatinine, Ser: 1.06 mg/dL (ref 0.50–1.35)
GFR calc non Af Amer: 84 mL/min — ABNORMAL LOW (ref >90)
Glucose, Bld: 115 mg/dL — ABNORMAL HIGH (ref 70–99)

## 2012-05-14 LAB — CBC
Hemoglobin: 12 g/dL — ABNORMAL LOW (ref 13.0–17.0)
MCH: 33.4 pg (ref 26.0–34.0)
MCHC: 33.1 g/dL (ref 30.0–36.0)
MCV: 100.8 fL — ABNORMAL HIGH (ref 78.0–100.0)
Platelets: 184 10*3/uL (ref 150–400)
RBC: 3.59 MIL/uL — ABNORMAL LOW (ref 4.22–5.81)

## 2012-05-14 MED ORDER — ALBUTEROL SULFATE (5 MG/ML) 0.5% IN NEBU
2.5000 mg | INHALATION_SOLUTION | Freq: Four times a day (QID) | RESPIRATORY_TRACT | Status: DC
  Administered 2012-05-14 – 2012-05-18 (×17): 2.5 mg via RESPIRATORY_TRACT
  Filled 2012-05-14 (×18): qty 0.5

## 2012-05-14 MED ORDER — HYDROCERIN EX CREA
TOPICAL_CREAM | Freq: Three times a day (TID) | CUTANEOUS | Status: DC | PRN
  Administered 2012-05-16: 11:00:00 via TOPICAL
  Filled 2012-05-14: qty 113

## 2012-05-14 MED ORDER — IPRATROPIUM BROMIDE 0.02 % IN SOLN
0.5000 mg | Freq: Four times a day (QID) | RESPIRATORY_TRACT | Status: DC
  Administered 2012-05-14 – 2012-05-18 (×18): 0.5 mg via RESPIRATORY_TRACT
  Filled 2012-05-14 (×18): qty 2.5

## 2012-05-14 MED ORDER — KETOROLAC TROMETHAMINE 15 MG/ML IJ SOLN
15.0000 mg | Freq: Four times a day (QID) | INTRAMUSCULAR | Status: DC
  Administered 2012-05-14 – 2012-05-15 (×4): 15 mg via INTRAVENOUS
  Filled 2012-05-14 (×7): qty 1

## 2012-05-14 MED ORDER — VANCOMYCIN HCL IN DEXTROSE 1-5 GM/200ML-% IV SOLN
1000.0000 mg | Freq: Three times a day (TID) | INTRAVENOUS | Status: DC
  Administered 2012-05-14 – 2012-05-16 (×6): 1000 mg via INTRAVENOUS
  Filled 2012-05-14 (×9): qty 200

## 2012-05-14 MED ORDER — BENZONATATE 100 MG PO CAPS
100.0000 mg | ORAL_CAPSULE | Freq: Three times a day (TID) | ORAL | Status: DC | PRN
  Administered 2012-05-14: 100 mg via ORAL
  Filled 2012-05-14: qty 1

## 2012-05-14 MED ORDER — PIPERACILLIN-TAZOBACTAM 3.375 G IVPB
3.3750 g | Freq: Three times a day (TID) | INTRAVENOUS | Status: DC
  Administered 2012-05-14 – 2012-05-16 (×6): 3.375 g via INTRAVENOUS
  Filled 2012-05-14 (×9): qty 50

## 2012-05-14 MED ORDER — GI COCKTAIL ~~LOC~~
30.0000 mL | Freq: Two times a day (BID) | ORAL | Status: DC | PRN
  Administered 2012-05-17: 30 mL via ORAL
  Filled 2012-05-14 (×2): qty 30

## 2012-05-14 MED ORDER — HYDROCOD POLST-CHLORPHEN POLST 10-8 MG/5ML PO LQCR
5.0000 mL | Freq: Two times a day (BID) | ORAL | Status: DC
  Administered 2012-05-14 – 2012-05-19 (×9): 5 mL via ORAL
  Filled 2012-05-14 (×9): qty 5

## 2012-05-14 MED ORDER — PANTOPRAZOLE SODIUM 40 MG PO TBEC
40.0000 mg | DELAYED_RELEASE_TABLET | Freq: Two times a day (BID) | ORAL | Status: DC
  Administered 2012-05-15 – 2012-05-19 (×6): 40 mg via ORAL
  Filled 2012-05-14 (×6): qty 1

## 2012-05-14 NOTE — Progress Notes (Signed)
RT completed protocol assessment.  Patient has a history of COPD and asthma.  Is a current smoker.  Patient came into to hospital with Chief complaint of persistant cough per H&P note.  Chest xray report shows multifocal PNA.  Patient takes no respiratory medications at home at this time.  RT changed treatments to Albuterol and Atrovent QID per protocol.

## 2012-05-14 NOTE — Progress Notes (Signed)
FMTS Attending Daily Note: David Levy MD [phone] pager office [phone] I have discussed this patient with the resident and reviewed the assessment and plan as documented above. I agree wit the resident's findings and plan. His CT scan shows a fairly atypical pattern with some splotchy areas in bilateral lungs, multilobar. HIV negative but this is a fairly atypical appearance--I will consult pulmonology and broaden his antibiotic coverage.
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
## 2012-05-14 NOTE — Progress Notes (Signed)
Family Medicine Teaching Service Daily Progress Note Intern Pager: [phone]  Patient name: David Benson Medical record number: [id number] Date of birth: [birth year] Age: 46 y.o. Gender: male  Primary Care Provider: Pcp Not In System  Subjective: Pt seen at bedside, wife not present this AM. States he slept very poorly and is still having pain especially with cough; morphine IV not helping (pt states "this IV might be messed up, I can't feel it go in like I did the Dilaudid, it sort of was warm"), nor is Norco PO. Cough continues to be severe, though pt not able to "bring much up," though it does not appear bloody. Some SOB, though oxygen and breathing treatments help.  Objective: Temp:  [97.5 F (36.4 C)-98.4 F (36.9 C)] 98.1 F (36.7 C) (07/24 0519) Pulse Rate:  [62-88] 88  (07/24 0519) Resp:  [17-20] 18  (07/24 0519) BP: (101-137)/(71-81) 118/73 mmHg (07/24 0519) SpO2:  [94 %-100 %] 97 % (07/24 0743) Weight:  [182 lb 1.6 oz (82.6 kg)] 182 lb 1.6 oz (82.6 kg) (07/23 2242) Exam: General: pt sitting up in bed, breathing heavily and coughing with deep breathing, appears very tired Cardiovascular: RRR, no rub/murmur appreciated Respiratory: expiratory wheezes diffusely, breath sounds otherwise coarse diffusely as well Abdomen: soft but muscle wall diffusely tender; no particular mass or other tenderness Extremities: moves extremities equally/spontaneously, distal pulses 2+ and intact/symmetric  Laboratory:  Lab 05/14/12 0625 05/13/12 0525 05/12/12 1942  WBC 11.3* 5.4 5.8  HGB 12.0* 12.5* 15.2  HCT 36.2* 37.3* 44.2  PLT 184 164 175    Lab 05/14/12 0625 05/13/12 0525 05/12/12 1942  NA 141 137 138  K 3.5 4.1 3.7  CL 107 105 102  CO2 25 22 20   BUN 8 10 10   CREATININE 1.06 1.18 1.06  CALCIUM 8.7 9.1 9.2  PROT -- 6.1 --  BILITOT -- 0.2* --  ALKPHOS -- 60 --  ALT -- 17 --  AST -- 22 --  GLUCOSE 115* 196* 107*    Lab 05/13/12 0525  CKTOTAL 413*  TROPONINI <0.30    TROPONINT --  CKMBINDEX --   UDS, 7/23: POSITIVE for cocaine, THC Legionella antigen, 7/23: negative  Imaging/Diagnostic Tests: CXR, 7/22 @1816  IMPRESSION:  1. Right greater left perihilar airspace opacity potentially  reflecting pneumonia, aspiration pneumonitis, or pulmonary  mass/adenopathy. Follow-up radiography in 1 month after treatment  is recommended to ensure clearance and exclude malignancy.  CTA, Chest, 7/23 @1738  Findings: The pulmonary arteries are adequately opacified. There  is no evidence of pulmonary embolism. Multilobar airspace disease  is present in both lungs. The most dense airspace disease is in the  right upper lobe. Findings are most likely consistent with  multifocal pneumonia. There is no evidence of associated pleural  fluid. Heart size is normal. No pericardial effusion. No  enlarged lymph nodes are identified. The thoracic aorta is of  normal caliber. No bony abnormalities.  IMPRESSION:  Multilobar airspace disease present throughout both lungs.  Findings are most likely consistent with multifocal pneumonia.  Plan:  1. Pulmonary - Pt with mixed asthma/COPD exacerbation and findings on CTA 7/23 consistent with multifocal pneumonia. Diffuse wheezes on exam at time of admission but no measured elevated temperature or elevated WBC at that time, started on Ceftriaxone and azithromycin at that time as well as prednisone 50 mg daily. Legionella initially considered due to complaint of recent diarrhea along with respiratory symptoms, but antigen was negative. Given severity of symptoms and general well-nourished/well-developed condition, HIV is  also on the DDx, test pending. Diffuse wheezes and severe cough with nasal congestion continues 7/24, WBC now 11.3, remains afebrile. PLAN - Continue Ceftriaxone, azithromycin (started 7/22). May consider changing abx. Will follow-up on HIV test. Albuterol and ipratropium PRN. Cont. IS and encourage good pulmonary toilet  with supplemental O2 as needed. May consider pulmonology consult, as well.  2. Chest/back/abdominal pain - Most likely secondary to asthma/COPD exacerbation and musculoskeletal pain from severe cough. POC troponin and CE panel both negative. EKG without ST changes at time of admission. Probable component of GERD and/or alcoholic gastritis, as well. Pain continues especially along with cough, pt stating morphine IV not as helpful as Dilaudid received in the ED, and Norco even less helpful. PLAN - Will consider adjusting pain medication, IV vs PO; currently on Norco 5-325, 1-2 tabs q6 PRN. Cont. scheduled ibuprofen. Protonix. Robitussin DM for cough. Zofran for nausea.  3. Diarrhea - Complaint at time of admission, for a few days prior to admission. No current complaints; most likely mild viral gastroenteritis though possible component of alcoholic gastritis. Will continue to monitor.  4. Polysubstance abuse - Pt is a current smoker, 1-2 packs per day. Also drinks 1-2 40-oz alcoholic drinks per day (last drink 7/21). Remote history of other substance abuse, but none admitted recently. UDS positive for cocaine, THC, though pt adamantly denies recent use. Tobacco cessation counseling was ordered. SW consulted as well, appreciated; pt does not believe he has any substance abuse problems and states that he "drinks casually" and is not interested in cessation resources, but does want to follow-up in [facility]. This may be better addressed as an outpt.  FEN/GI: regular diet PPx: Protonix, Lovenox Dispo: home pending improvement Code Status: Full COde  Ameena Vesey, [place], MD 05/14/2012, 9:28 AM

## 2012-05-14 NOTE — Progress Notes (Signed)
ANTIBIOTIC CONSULT NOTE - INITIAL  Pharmacy Consult for Vancomycin and Zosyn Indication: pneumonia  No Known Allergies  Patient Measurements: Height: 5\' 9"  (175.3 cm) Weight: 182 lb 1.6 oz (82.6 kg) IBW/kg (Calculated) : 70.7    Vital Signs: Temp: 98.4 F (36.9 C) (07/24 1000) Temp src: Oral (07/24 1000) BP: 125/74 mmHg (07/24 1000) Pulse Rate: 89  (07/24 1000) Intake/Output from previous day: 07/23 0701 - 07/24 0700 In: 3180 [P.O.:480; I.V.:2400; IV Piggyback:300] Out: 750 [Urine:750] Intake/Output from this shift: Total I/O In: 240 [P.O.:240] Out: -   Labs:  Basename 05/14/12 0625 05/13/12 0525 05/12/12 1942  WBC 11.3* 5.4 5.8  HGB 12.0* 12.5* 15.2  PLT 184 164 175  LABCREA -- -- --  CREATININE 1.06 1.18 1.06   Estimated Creatinine Clearance: 88.9 ml/min (by C-G formula based on Cr of 1.06). No results found for this basename: VANCOTROUGH:2,VANCOPEAK:2,VANCORANDOM:2,GENTTROUGH:2,GENTPEAK:2,GENTRANDOM:2,TOBRATROUGH:2,TOBRAPEAK:2,TOBRARND:2,AMIKACINPEAK:2,AMIKACINTROU:2,AMIKACIN:2, in the last 72 hours   Microbiology: No results found for this or any previous visit (from the past 720 hour(s)).  Medical History: Past Medical History  Diagnosis Date  . Pneumonia   . Asthma     Medications:  Scheduled:    . albuterol  2.5 mg Nebulization QID  . enoxaparin (LOVENOX) injection  40 mg Subcutaneous Q24H  . feeding supplement  237 mL Oral BID BM  . folic acid  1 mg Oral Daily  . guaiFENesin  600 mg Oral BID  . ibuprofen  800 mg Oral TID  . ipratropium  0.5 mg Nebulization QID  . multivitamin with minerals  1 tablet Oral Daily  . pantoprazole  40 mg Oral Q1200  . pneumococcal 23 valent vaccine  0.5 mL Intramuscular Tomorrow-1000  . predniSONE  50 mg Oral Q breakfast  . thiamine  100 mg Oral Daily   Or  . thiamine  100 mg Intravenous Daily  . zolpidem  5 mg Oral QHS  . DISCONTD: albuterol  2.5 mg Nebulization Q4H  . DISCONTD: azithromycin  500 mg Intravenous  Q24H  . DISCONTD: cefTRIAXone (ROCEPHIN)  IV  1 g Intravenous Q24H  . DISCONTD: ipratropium  0.5 mg Nebulization Q4H   Assessment: Patient is a 46 y.o M with Rocephin and Azithromycin started on admission for suspected CAP.  CTA with noted multilobar airspace consistent with PNA.  Patient is afebrile but wbc is trending up.  To change abx to vancomycin and zosyn for board coverage.   Goal of Therapy:  Vancomycin trough level 15-20 mcg/ml  Plan:  1) zosyn 3.375gm IV q8h (infuse over 4 hours) 2) vancomycin 1gm IV q8h   Bynum Mccullars P 05/14/2012,1:08 PM

## 2012-05-14 NOTE — Progress Notes (Signed)
Patient requests not to be awaked for 4 am nebulizer this AM.  Patient states that if wakes up and decides he needs the treatment he will have the RN notify the RT.  Gypsy Decant, RRT, RCP

## 2012-05-14 NOTE — Progress Notes (Signed)
Clinical Child psychotherapist (CSW) contacted pt over the phone to discuss pt history of substance abuse as pt wife was present in the room yesterday and unwilling to leave pt side. Pt stated he does not believe he has any challenges with alcohol, he drinks casually and is not interested in any resources for substance abuse. CSW informed pt that CSW has contacted the financial counselor to assist pt in a Medicaid application and will inform FMTS of pt request to be followed at the [facility]. Pt had no additional concerns however was very appreciative of CSW intervention. No further CSW needs. CSW signing off.  Theresia Bough, MSW, Theresia Majors [phone]

## 2012-05-15 ENCOUNTER — Inpatient Hospital Stay (HOSPITAL_COMMUNITY): Payer: Self-pay

## 2012-05-15 DIAGNOSIS — F3289 Other specified depressive episodes: Secondary | ICD-10-CM

## 2012-05-15 DIAGNOSIS — F329 Major depressive disorder, single episode, unspecified: Secondary | ICD-10-CM

## 2012-05-15 DIAGNOSIS — R0602 Shortness of breath: Secondary | ICD-10-CM

## 2012-05-15 DIAGNOSIS — F1010 Alcohol abuse, uncomplicated: Secondary | ICD-10-CM

## 2012-05-15 LAB — CBC WITH DIFFERENTIAL/PLATELET
Basophils Absolute: 0 10*3/uL (ref 0.0–0.1)
Basophils Absolute: 0 10*3/uL (ref 0.0–0.1)
Basophils Relative: 0 % (ref 0–1)
Eosinophils Relative: 0 % (ref 0–5)
Hemoglobin: 13 g/dL (ref 13.0–17.0)
Lymphocytes Relative: 11 % — ABNORMAL LOW (ref 12–46)
Lymphs Abs: 1.5 10*3/uL (ref 0.7–4.0)
MCHC: 33.3 g/dL (ref 30.0–36.0)
Neutro Abs: 11.1 10*3/uL — ABNORMAL HIGH (ref 1.7–7.7)
Neutro Abs: 10.8 10*3/uL — ABNORMAL HIGH (ref 1.7–7.7)
Neutrophils Relative %: 83 % — ABNORMAL HIGH (ref 43–77)
Neutrophils Relative %: 91 % — ABNORMAL HIGH (ref 43–77)
Platelets: 195 10*3/uL (ref 150–400)
RBC: 3.89 MIL/uL — ABNORMAL LOW (ref 4.22–5.81)
RDW: 14.1 % (ref 11.5–15.5)
RDW: 14.2 % (ref 11.5–15.5)
WBC: 13.4 10*3/uL — ABNORMAL HIGH (ref 4.0–10.5)

## 2012-05-15 LAB — VANCOMYCIN, TROUGH: Vancomycin Tr: 17.1 ug/mL (ref 10.0–20.0)

## 2012-05-15 LAB — POCT I-STAT 3, ART BLOOD GAS (G3+)
O2 Saturation: 88 %
pCO2 arterial: 41 mmHg (ref 35.0–45.0)

## 2012-05-15 LAB — GLUCOSE, CAPILLARY: Glucose-Capillary: 104 mg/dL — ABNORMAL HIGH (ref 70–99)

## 2012-05-15 LAB — BASIC METABOLIC PANEL
Calcium: 8.9 mg/dL (ref 8.4–10.5)
Chloride: 103 mEq/L (ref 96–112)
Creatinine, Ser: 1.45 mg/dL — ABNORMAL HIGH (ref 0.50–1.35)
GFR calc Af Amer: 66 mL/min — ABNORMAL LOW (ref >90)
Sodium: 141 mEq/L (ref 135–145)

## 2012-05-15 LAB — PHOSPHORUS: Phosphorus: 3.5 mg/dL (ref 2.3–4.6)

## 2012-05-15 LAB — MRSA PCR SCREENING: MRSA by PCR: NEGATIVE

## 2012-05-15 MED ORDER — LEVOFLOXACIN IN D5W 750 MG/150ML IV SOLN
750.0000 mg | INTRAVENOUS | Status: DC
  Administered 2012-05-15 – 2012-05-18 (×4): 750 mg via INTRAVENOUS
  Filled 2012-05-15 (×8): qty 150

## 2012-05-15 MED ORDER — ALBUTEROL SULFATE (5 MG/ML) 0.5% IN NEBU
5.0000 mg | INHALATION_SOLUTION | RESPIRATORY_TRACT | Status: AC
  Administered 2012-05-15: 5 mg via RESPIRATORY_TRACT

## 2012-05-15 MED ORDER — METHYLPREDNISOLONE SODIUM SUCC 125 MG IJ SOLR
60.0000 mg | INTRAMUSCULAR | Status: DC
  Administered 2012-05-15: 60 mg via INTRAVENOUS
  Filled 2012-05-15: qty 2
  Filled 2012-05-15: qty 0.96

## 2012-05-15 MED ORDER — FENTANYL CITRATE 0.05 MG/ML IJ SOLN
12.5000 ug | Freq: Once | INTRAMUSCULAR | Status: AC
  Administered 2012-05-15: 12.5 ug via INTRAVENOUS
  Filled 2012-05-15: qty 2

## 2012-05-15 MED ORDER — METHYLPREDNISOLONE SODIUM SUCC 125 MG IJ SOLR
125.0000 mg | Freq: Three times a day (TID) | INTRAMUSCULAR | Status: DC
  Administered 2012-05-15 – 2012-05-16 (×2): 125 mg via INTRAVENOUS
  Filled 2012-05-15 (×4): qty 2

## 2012-05-15 MED ORDER — FUROSEMIDE 10 MG/ML IJ SOLN
20.0000 mg | Freq: Four times a day (QID) | INTRAMUSCULAR | Status: AC
  Administered 2012-05-15 – 2012-05-16 (×4): 20 mg via INTRAVENOUS
  Filled 2012-05-15 (×4): qty 2

## 2012-05-15 MED ORDER — POTASSIUM CHLORIDE CRYS ER 20 MEQ PO TBCR
40.0000 meq | EXTENDED_RELEASE_TABLET | Freq: Once | ORAL | Status: AC
  Administered 2012-05-15: 40 meq via ORAL
  Filled 2012-05-15: qty 1

## 2012-05-15 NOTE — Progress Notes (Signed)
FMTS Attending Daily Note: Kin Galbraith MD [phone] pager office [phone] I have discussed this patient with the resident and reviewed the assessment and plan as documented above. I agree wit the resident's findings and plan.  

## 2012-05-15 NOTE — Progress Notes (Signed)
Report was call to [facility] RN.pt. Was transfer to 2110.

## 2012-05-15 NOTE — Progress Notes (Signed)
Family Medicine Teaching Service Daily Progress Note Intern Pager: [phone]  Patient name: David Benson Medical record number: [id number] Date of birth: 01/25/1966 Age: 46 y.o. Gender: male  Primary Care Provider: Pcp Not In System  Subjective: Pt seen at bedside with Dr. Louanne Belton (see his interim progress note, 7/25, 0835). Pt with acute desats on room air and on [place] to 70's this morning. Pt had just finished albuterol treatment at time of our arrival to room, sats in 90-92 on non-rebreather mask, dropped to 80 as pt moved mask to talk. Pt states he started feeling like he could not breathe as well early this morning and had some sputum with blood in it. Pt started on another albuterol treatment while we were still in the room, with sats still in the high 80's to low 90's.  Objective: Temp:  [98.2 F (36.8 C)-100.1 F (37.8 C)] 99.5 F (37.5 C) (07/25 0758) Pulse Rate:  [53-107] 87  (07/25 0915) Resp:  [18-30] 30  (07/25 0915) BP: (104-125)/(63-74) 114/73 mmHg (07/25 0915) SpO2:  [84 %-100 %] 85 % (07/25 0923) FiO2 (%):  [21 %-100 %] 50 % (07/25 0923) Weight:  [186 lb 9.6 oz (84.641 kg)] 186 lb 9.6 oz (84.641 kg) (07/24 2240) Exam: General: Pt sitting up in bed, breathing heavily appearing very winded, on non-rebreather mask/nebulizer mask, very out of breath when trying to speak Cardiovascular: rate slightly increased, no rub/murmur appreciated; heart sounds slightly difficult to hear secondary to loud breath sounds Respiratory: expiratory wheezes and diffuse loud rhonchi scattered in both lungs, significantly increased work of breathing with some slight supraclavicular retractions Abdomen: soft but muscle wall diffusely slightly tender, some guarding but possibly just muscle tightness due to increased work of breathing Extremities: moves extremities equally/spontaneously, distal pulses 2+ and intact/symmetric  Laboratory:  Lab 05/14/12 0625 05/13/12 0525 05/12/12 1942  WBC 11.3* 5.4  5.8  HGB 12.0* 12.5* 15.2  HCT 36.2* 37.3* 44.2  PLT 184 164 175    Lab 05/14/12 0625 05/13/12 0525 05/12/12 1942  NA 141 137 138  K 3.5 4.1 3.7  CL 107 105 102  CO2 25 22 20   BUN 8 10 10   CREATININE 1.06 1.18 1.06  CALCIUM 8.7 9.1 9.2  PROT -- 6.1 --  BILITOT -- 0.2* --  ALKPHOS -- 60 --  ALT -- 17 --  AST -- 22 --  GLUCOSE 115* 196* 107*    Lab 05/13/12 0525  CKTOTAL 413*  TROPONINI <0.30  TROPONINT --  CKMBINDEX --   UDS, 7/23: POSITIVE for cocaine, THC Legionella antigen, 7/23: negative  Imaging/Diagnostic Tests: CXR, 7/22 @1816  IMPRESSION:  1. Right greater left perihilar airspace opacity potentially  reflecting pneumonia, aspiration pneumonitis, or pulmonary  mass/adenopathy. Follow-up radiography in 1 month after treatment  is recommended to ensure clearance and exclude malignancy.  CTA, Chest, 7/23 @1738  Findings: The pulmonary arteries are adequately opacified. There  is no evidence of pulmonary embolism. Multilobar airspace disease  is present in both lungs. The most dense airspace disease is in the  right upper lobe. Findings are most likely consistent with  multifocal pneumonia. There is no evidence of associated pleural  fluid. Heart size is normal. No pericardial effusion. No  enlarged lymph nodes are identified. The thoracic aorta is of  normal caliber. No bony abnormalities.  IMPRESSION:  Multilobar airspace disease present throughout both lungs.  Findings are most likely consistent with multifocal pneumonia.  Assessment and Plan: Landrum Carbonell is a 46yo male with PMH of  asthma/COPD (current smoker), and tobacco/alcohol/other drug abuse presenting with 1 week of cough and worsening SOB with associated chest/back pain, subjective fever, sweating, body aches, and congestion, found to have multifocal pneumonia. Admitted on 7/23, transferring to ICU on 7/25 due to acutely worsened respiratory status.  1. Pulmonary - Pt with mixed asthma/COPD  exacerbation and findings on CTA 7/23 consistent with multifocal pneumonia. Diffuse wheezes on exam at time of admission but no measured elevated temperature or elevated WBC at that time, started on Ceftriaxone and azithromycin at that time as well as prednisone 50 mg daily. Legionella initially considered due to complaint of recent diarrhea along with respiratory symptoms, but antigen was negative. HIV negative as well. Pulmonology consulted 7/24 (Dr. Molli Knock), recommendations of continuing IV abx and possibly checking echo for endocarditis. Pt with desats and greatly increased work of breathing, as above, morning of 7/25. Temp also elevated, 99.5 ~0800 during my exam, up to 100.1 earlier around 0615. PLAN - Transfer to ICU and await any CCM/pulmonary recommendations; pt may require BiPAP and/or intubation. Continue Vancomycin/Zosyn IV (started 7/24). Prednisone PO changed to Solu-Medrol.  2. Chest/back/abdominal pain - Most likely secondary to asthma/COPD exacerbation and musculoskeletal pain from severe cough. POC troponin and CE panel both negative. EKG without ST changes at time of admission. Probable component of GERD and/or alcoholic gastritis, as well. Pain continued 7/24, especially along with cough, pt stating morphine IV not as helpful as Dilaudid received in the ED, and Norco even less helpful. PLAN - Scheduled ibuprofen changed to Toradol on 7/24; will defer any further changes to pain meds until acute respiratory distress resolved. Continue Protonix. Zofran for nausea.  3. Diarrhea - Complaint at time of admission, for a few days prior to admission. No current complaints; most likely mild viral gastroenteritis though possible component of alcoholic gastritis. Will continue to monitor.  4. Polysubstance abuse - Pt is a current smoker, 1-2 packs per day. Also drinks 1-2 40-oz alcoholic drinks per day (last drink 7/21). Remote history of other substance abuse, but none admitted recently. UDS  positive for cocaine, THC, though pt adamantly denies recent use. Tobacco cessation counseling was ordered. SW consulted as well, appreciated; pt does not believe he has any substance abuse problems and states that he "drinks casually" and is not interested in cessation resources, but does want to follow-up in [facility] clinic. This may be better addressed as an outpt.  FEN/GI: regular diet PPx: Protonix, Lovenox Dispo: home pending improvement Code Status: Full COde  Jaivian Battaglini, [place], MD 05/15/2012, 9:25 AM

## 2012-05-15 NOTE — Progress Notes (Signed)
Called to patient room due to relatively acute onset of O2 sats dropping to the mid to low 70s on [place].  Pt now sating around 90-92% on non-rebreather.  Has just finished albuterol treatment.  Patient has equal breath sounds with significant ronchi and wheezes.  Is working somewhat hard to breath and is feeling quite uncomfortable.  I have ordered another albuterol neb and have changed from prednisone to solumedrol.  Have discussed him with Dr. Sharol Harness with pulm/ccm and will transfer to ICU.  Will continue to monitor closely for any worsening that might require BiPAP vs intubation.  Have also ordered both lactic acid and PCT although I doubt he is moving toward sepsis.  Will continue him on vanc/zosyn.  Only thing we are not covering is fungal and we would appreciate any thoughts from pulmonary on this.  Yoav Okane 05/15/2012, 8:35 AM [phone]

## 2012-05-15 NOTE — Significant Event (Signed)
Rapid Response Event Note  Overview: Time Called: 0745 Arrival Time: 0750 Event Type: Respiratory  Initial Focused Assessment: Called to patient's bedside, because increased O2 needs.  Patient has received scheduled resp tx, and is on 50% venti.  Patient using accessory muscles, RR 24, O2 sats 84-90% BP 108/71  HR 107  Temp 99.5 ax. Lung sounds rhonchi and wheezes through out.  Heart tones regular Increased O2 to 100% NRB  O2 sat 94%.  Patient desats quickly when he lifts mask to speak to 80% MD to bedside  Interventions: Additional 2 albuterol treatments.  O2 sats increased to 95-97% briefly.  Then O2 sat on 100% NRB 80-93%.  O2 sat improves with cough. Non productive cough, pt states coughing is very painful (abd and lower back) 0900 patient became sleepy, arouse easily,  Placed patient on 50% venti, unable to maintain O2 sat greater than 90%.  Encouraged deep breathing and cough.  Placed pt on 100% NRB for transport to ICU. Transported to 2110 via bed with O2 and heart monitor.  Upon arrival pt remains sleepy, Able to wean off NRB to 50% Venti.  O2 sat 94%. Pt able to follow commands. 2nd IV started Right arm Wife at bedside, updated on patient status.   Event Summary: Name of Physician Notified: Dr Louanne Belton at 0800  Dr Sharol Harness, phone consult:  Plan tx to ICU  Outcome: Transferred (Comment)     Marcellina Millin

## 2012-05-15 NOTE — Progress Notes (Signed)
Pt noted with decreased oxygen saturations, 70-80s on 4L [place] with complaints of abdominal pain and chest tightness. MD notified, instructed to give nebs and switch to non-rebreather. Treatments completed with slight improvement, Rapid Response called. MD paged with update, at bedside to evaluate. Report given to day shift RN, who will continue monitoring
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
## 2012-05-15 NOTE — Progress Notes (Signed)
ANTIBIOTIC CONSULT NOTE - INITIAL  Pharmacy Consult for Vancomycin Indication: pneumonia  No Known Allergies  Patient Measurements: Height: 5\' 9"  (175.3 cm) Weight: 187 lb 2.7 oz (84.9 kg) IBW/kg (Calculated) : 70.7    Vital Signs: Temp: 99 F (37.2 C) (07/25 1137) Temp src: Oral (07/25 1137) BP: 130/78 mmHg (07/25 1500) Pulse Rate: 59  (07/25 1500) Intake/Output from previous day: 07/24 0701 - 07/25 0700 In: 240 [P.O.:240] Out: 1452 [Urine:1450; Stool:2] Intake/Output from this shift: Total I/O In: 327 [I.V.:300; IV Piggyback:27] Out: 500 [Urine:500]  Labs:  [facility] 05/15/12 1441 05/15/12 1020 05/14/12 0625 05/13/12 0525 05/12/12 1942  WBC 12.0* 13.4* 11.3* -- --  HGB 13.0 13.0 12.0* -- --  PLT 188 195 184 -- --  LABCREA -- -- -- -- --  CREATININE -- -- 1.06 1.18 1.06   Estimated Creatinine Clearance: 96.1 ml/min (by C-G formula based on Cr of 1.06).  Basename 05/15/12 1442  VANCOTROUGH 17.1  VANCOPEAK --  VANCORANDOM --  GENTTROUGH --  GENTPEAK --  GENTRANDOM --  TOBRATROUGH --  TOBRAPEAK --  TOBRARND --  AMIKACINPEAK --  AMIKACINTROU --  AMIKACIN --     Microbiology: Recent Results (from the past 720 hour(s))  MRSA PCR SCREENING     Status: Normal   Collection Time   05/15/12  9:55 AM      Component Value Range Status Comment   MRSA by PCR NEGATIVE  NEGATIVE Final     Medical History: Past Medical History  Diagnosis Date  . Pneumonia   . Asthma     Medications:  Scheduled:     . albuterol  2.5 mg Nebulization QID  . albuterol  5 mg Nebulization NOW  . chlorpheniramine-HYDROcodone  5 mL Oral Q12H  . enoxaparin (LOVENOX) injection  40 mg Subcutaneous Q24H  . fentaNYL  12.5 mcg Intravenous Once  . folic acid  1 mg Oral Daily  . furosemide  20 mg Intravenous Q6H  . ipratropium  0.5 mg Nebulization QID  . levofloxacin (LEVAQUIN) IV  750 mg Intravenous Q24H  . methylPREDNISolone (SOLU-MEDROL) injection  125 mg Intravenous Q8H  .  multivitamin with minerals  1 tablet Oral Daily  . pantoprazole  40 mg Oral BID AC  . piperacillin-tazobactam (ZOSYN)  IV  3.375 g Intravenous Q8H  . potassium chloride  40 mEq Oral Once  . thiamine  100 mg Oral Daily   Or  . thiamine  100 mg Intravenous Daily  . vancomycin  1,000 mg Intravenous Q8H  . zolpidem  5 mg Oral QHS  . DISCONTD: feeding supplement  237 mL Oral BID BM  . DISCONTD: guaiFENesin  600 mg Oral BID  . DISCONTD: ibuprofen  800 mg Oral TID  . DISCONTD: ketorolac  15 mg Intravenous Q6H  . DISCONTD: methylPREDNISolone (SOLU-MEDROL) injection  60 mg Intravenous Q24H  . DISCONTD: pantoprazole  40 mg Oral Q1200  . DISCONTD: predniSONE  50 mg Oral Q breakfast   Assessment: Patient is a 46 y.o M on Day #2 Vancomycin and zosyn for CAP. Patient is Tm 101.2 but wbc is trending up. Renal function has been stable, UOP 0.61ml/kg/hr. Vancomycin trough is therapeutic = 17.1. Per RN, 2PM vancomycin dose was charted prior to level drawn, but actually didn't go in.   Goal of Therapy:  Vancomycin trough level 15-20 mcg/ml  Plan:  - Continue vancomycin 1gm IV q8h - f/u renal function and clinical course    Bayard Hugger, PharmD, BCPS  Clinical Pharmacist  Pager: [phone]  05/15/2012,3:45 PM

## 2012-05-15 NOTE — Progress Notes (Signed)
*  PRELIMINARY RESULTS* Echocardiogram 2D Echocardiogram has been performed.  Jeryl Columbia 05/15/2012, 2:31 PM

## 2012-05-16 ENCOUNTER — Inpatient Hospital Stay (HOSPITAL_COMMUNITY): Payer: Self-pay

## 2012-05-16 ENCOUNTER — Encounter (HOSPITAL_COMMUNITY): Payer: Self-pay

## 2012-05-16 DIAGNOSIS — K219 Gastro-esophageal reflux disease without esophagitis: Secondary | ICD-10-CM

## 2012-05-16 LAB — CBC
MCH: 33.7 pg (ref 26.0–34.0)
Platelets: 207 10*3/uL (ref 150–400)
WBC: 11.6 10*3/uL — ABNORMAL HIGH (ref 4.0–10.5)

## 2012-05-16 LAB — HEPATITIS PANEL, ACUTE
Hep A IgM: NEGATIVE
Hep B C IgM: NEGATIVE

## 2012-05-16 LAB — COMPREHENSIVE METABOLIC PANEL
ALT: 16 U/L (ref 0–53)
AST: 26 U/L (ref 0–37)
Albumin: 2.7 g/dL — ABNORMAL LOW (ref 3.5–5.2)
Alkaline Phosphatase: 74 U/L (ref 39–117)
Chloride: 97 mEq/L (ref 96–112)
Potassium: 4.2 mEq/L (ref 3.5–5.1)
Total Bilirubin: 0.2 mg/dL — ABNORMAL LOW (ref 0.3–1.2)

## 2012-05-16 MED ORDER — METHYLPREDNISOLONE SODIUM SUCC 125 MG IJ SOLR
60.0000 mg | Freq: Two times a day (BID) | INTRAMUSCULAR | Status: DC
  Administered 2012-05-16: 17:00:00 via INTRAVENOUS
  Administered 2012-05-17 – 2012-05-19 (×5): 60 mg via INTRAVENOUS
  Filled 2012-05-16 (×6): qty 0.96
  Filled 2012-05-16: qty 2
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
## 2012-05-16 NOTE — Progress Notes (Signed)
FMTS Attending Daily Note: Charlotta Lapaglia MD [phone] pager office [phone] I have discussed this patient with the resident and reviewed the assessment and plan as documented above. I agree wit the resident's findings and plan.  
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
## 2012-05-16 NOTE — Care Management Note (Unsigned)
    Page 1 of 1   05/19/2012     3:02:48 PM   CARE MANAGEMENT NOTE 05/19/2012  Patient:  David Benson, David Benson   Account Number:  [id number]  Date Initiated:  05/16/2012  Documentation initiated by:  Avie Arenas  Subjective/Objective Assessment:   Possible pneumonia - deteriorated on 05-15-12 requiring ICU admission for possible intubation.  Improved on NRB and treatments - tx back to floor on 04-16-12     Action/Plan:   Anticipated DC Date:  05/19/2012   Anticipated DC Plan:  HOME/SELF CARE      DC Planning Services  CM consult      Choice offered to / List presented to:             Status of service:  In process, will continue to follow Medicare Important Message given?   (If response is "NO", the following Medicare IM given date fields will be blank) Date Medicare IM given:   Date Additional Medicare IM given:    Discharge Disposition:    Per UR Regulation:  Reviewed for med. necessity/level of care/duration of stay  If discussed at Long Length of Stay Meetings, dates discussed:    Comments:  ContactTaran, [place] Spouse [phone]   05-19-12 [address] RN,BSN [phone] CM did speak to MD and plan is for d/c within the next 24-48 hours post d/c. Pt will be f/u in the clinic and he will meet with Jaynee Eagles to see if can get assistance with orange card for medications. Pt is eligible for zz fund for medications. If you would like for pt to use please write Rx for 3 day supply of meds no refills and CM will assist with this.  05-16-12 9:40am Avie Arenas, RNBSN [phone] Talked with patient and spouse in room.  patient independent prior to admission - did not take any meds. Was supposed to be albuterol IH but never got prescription filled.  Needs PCP - states have been told that they are working on the [facility] clinics being his primary. Talked about [facility].

## 2012-05-17 ENCOUNTER — Inpatient Hospital Stay (HOSPITAL_COMMUNITY): Payer: Self-pay

## 2012-05-17 LAB — BASIC METABOLIC PANEL
CO2: 31 mEq/L (ref 19–32)
Calcium: 9.4 mg/dL (ref 8.4–10.5)
GFR calc non Af Amer: 82 mL/min — ABNORMAL LOW (ref >90)
Glucose, Bld: 163 mg/dL — ABNORMAL HIGH (ref 70–99)
Potassium: 4.2 mEq/L (ref 3.5–5.1)
Sodium: 139 mEq/L (ref 135–145)

## 2012-05-17 LAB — CBC
HCT: 38.1 % — ABNORMAL LOW (ref 39.0–52.0)
MCV: 99.5 fL (ref 78.0–100.0)
RBC: 3.83 MIL/uL — ABNORMAL LOW (ref 4.22–5.81)
WBC: 13.2 10*3/uL — ABNORMAL HIGH (ref 4.0–10.5)

## 2012-05-17 MED ORDER — DIPHENHYDRAMINE HCL 25 MG PO CAPS
25.0000 mg | ORAL_CAPSULE | ORAL | Status: DC | PRN
  Administered 2012-05-17: 25 mg via ORAL
  Filled 2012-05-17: qty 1

## 2012-05-17 MED ORDER — KETOROLAC TROMETHAMINE 30 MG/ML IJ SOLN
30.0000 mg | Freq: Four times a day (QID) | INTRAMUSCULAR | Status: DC
  Administered 2012-05-17 – 2012-05-19 (×7): 30 mg via INTRAVENOUS
  Filled 2012-05-17 (×14): qty 1

## 2012-05-17 NOTE — Progress Notes (Signed)
Family Medicine Teaching Service Daily Progress Note Intern Pager: [phone]  Patient name: David Benson Medical record number: [id number] Date of birth: [birth year] Age: 46 y.o. Gender: male  Primary Care Provider: Pcp Not In System  Subjective: Pt doing okay this morning.  Complains of significant pain in chest and costral margin with cough and IS.  Using IS around Norco dose.  States feels like breathing is improving.  Objective: Temp:  [98 F (36.7 C)-99 F (37.2 C)] 98 F (36.7 C) (07/27 0552) Pulse Rate:  [32-106] 76  (07/27 0552) Resp:  [13-37] 18  (07/27 0552) BP: (100-128)/(65-85) 125/81 mmHg (07/27 0552) SpO2:  [75 %-95 %] 92 % (07/27 0849) FiO2 (%):  [44 %] 44 % (07/27 0849) Weight:  [185 lb 10 oz (84.2 kg)] 185 lb 10 oz (84.2 kg) (07/27 0552)  Exam: General: sleeping comfortably, wakes to voice, NAD, no cough Cardiovascular: RRR, no murmurs Respiratory: normal work of breathing, good air movement, mild scattered rhonchi, minimal expiratory wheeze Extremities: no edmea  Laboratory:  Lab 05/17/12 0610 05/16/12 0915 05/15/12 1441  WBC 13.2* 11.6* 12.0*  HGB 13.1 14.3 13.0  HCT 38.1* 42.7 39.0  PLT 234 207 188    Lab 05/17/12 0610 05/16/12 0505 05/15/12 1728 05/13/12 0525  NA 139 138 141 --  K 4.2 4.2 4.5 --  CL 98 97 103 --  CO2 31 31 28  --  BUN 15 12 10  --  CREATININE 1.08 1.29 1.45* --  CALCIUM 9.4 9.3 8.9 --  PROT -- 6.5 -- 6.1  BILITOT -- 0.2* -- 0.2*  ALKPHOS -- 74 -- 60  ALT -- 16 -- 17  AST -- 26 -- 22  GLUCOSE 163* 171* 165* --   UDS: POSITIVE for cocaine, THC Legionella antigen: negative  CXR: stable on my read - final read pending  Assessment and Plan:  David Benson is a 46yo male with PMH of asthma/COPD (current smoker), and tobacco/alcohol/other drug abuse found to have multifocal pneumonia. Admitted on 7/23, transferred to ICU/stepdown on 7/25 due to acutely worsened respiratory status, then back to tele bed on 7/26.  # Pulmonary -  Pt with mixed asthma/COPD exacerbation and findings on CTA 7/23 consistent with multifocal pneumonia. Initially started on Ceftriaxone, azithromycin, and prednisone 50 mg daily.  This was escalated to Vanc and Zosyn when patient became clinically worse.  Pulmonology was consulted on 7/24 for continued worsening respiratory status; an echo was checked and was normal. HIV is negative. Continued to worsen and was transferred to step down 7/25 where he improved with diuresis, switch to IV steroids and addition of Levaquin. Vanc and zosyn stopped on 7/25. -concern now is for hypersensitivity pneumonitis and non-cardiac pulmonary edema related to drug use with possible aspiration pneumonia  - appreciated pulmonary's recommendations - continue IV levaquin - f/u chest x-ray - can restart lasix if respiratory status declines - wean O2 as tolerated  # Chest/back/abdominal pain - Most likely secondary to asthma/COPD exacerbation and musculoskeletal pain from severe cough. POC troponin and CE panel both negative. EKG without ST changes at time of admission. Probable component of GERD and/or alcoholic gastritis, as well.  - continue Norco prn - start Toradol 30mg  q6hr scheduled - continue protonix  # Elevated creatinine - Secondary to lasix.  Resolved with stopping lasix.  # Diarrhea - Complaint at time of admission, for a few days prior to admission. No current complaints; most likely mild viral gastroenteritis though possible component of alcoholic gastritis. Will continue to monitor.  #  Polysubstance abuse - Pt is a current smoker, 1-2 packs per day. Also drinks 1-2 40-oz alcoholic drinks per day (last drink 7/21). Remote history of other substance abuse, but none admitted recently. UDS positive for cocaine, THC, though pt adamantly denies recent use. Tobacco cessation counseling was ordered. SW consulted as well, appreciated; pt does not believe he has any substance abuse problems and states that he "drinks  casually" and is not interested in cessation resources, but does want to follow-up in [facility] clinic. This may be better addressed as an outpt.  FEN/GI: regular diet PPx: Protonix, Lovenox Dispo: home pending improvement Code Status: Full Code  BOOTH, David Westman, MD 05/17/2012, 8:57 AM

## 2012-05-17 NOTE — Progress Notes (Signed)
FMTS Attending Daily Note: David Bittman MD [phone] pager office [phone] I have discussed this patient with the resident and reviewed the assessment and plan as documented above. I agree wit the resident's findings and plan.  

## 2012-05-18 LAB — CBC
HCT: 42.2 % (ref 39.0–52.0)
MCH: 33.5 pg (ref 26.0–34.0)
MCHC: 32.7 g/dL (ref 30.0–36.0)
MCV: 102.4 fL — ABNORMAL HIGH (ref 78.0–100.0)
RDW: 13.6 % (ref 11.5–15.5)
WBC: 8.8 10*3/uL (ref 4.0–10.5)

## 2012-05-18 LAB — BASIC METABOLIC PANEL
BUN: 24 mg/dL — ABNORMAL HIGH (ref 6–23)
CO2: 33 mEq/L — ABNORMAL HIGH (ref 19–32)
Chloride: 98 mEq/L (ref 96–112)
Creatinine, Ser: 1.26 mg/dL (ref 0.50–1.35)
Glucose, Bld: 127 mg/dL — ABNORMAL HIGH (ref 70–99)

## 2012-05-18 LAB — CARDIAC PANEL(CRET KIN+CKTOT+MB+TROPI)
Relative Index: 3.1 — ABNORMAL HIGH (ref 0.0–2.5)
Total CK: 144 U/L (ref 7–232)
Troponin I: <0.3 ng/mL (ref <0.30)
Troponin I: <0.3 ng/mL (ref <0.30)

## 2012-05-18 MED ORDER — HYDRALAZINE HCL 20 MG/ML IJ SOLN
5.0000 mg | Freq: Once | INTRAMUSCULAR | Status: AC
  Administered 2012-05-18: 5 mg via INTRAVENOUS
  Filled 2012-05-18: qty 1

## 2012-05-18 MED ORDER — ALUM & MAG HYDROXIDE-SIMETH 200-200-20 MG/5ML PO SUSP: 30.0000 mL | Freq: Two times a day (BID) | ORAL | Status: DC | PRN

## 2012-05-18 MED ORDER — LORAZEPAM 2 MG/ML IJ SOLN: 1.0000 mg | Freq: Four times a day (QID) | INTRAMUSCULAR | Status: DC | PRN

## 2012-05-18 MED ORDER — LORAZEPAM 1 MG PO TABS: 1.0000 mg | ORAL_TABLET | Freq: Four times a day (QID) | ORAL | Status: DC | PRN

## 2012-05-18 NOTE — Progress Notes (Signed)
Pt heart rate down to 39 nonsustained sinus bradycardia, hr sustaining 40s-50s.  Pt awakened and noted snoring, pt bp 142/86.  MD notified with orders for EKG.

## 2012-05-18 NOTE — Progress Notes (Signed)
FMTS Attending Daily Note: Denny Levy MD [phone] pager office [phone] I  have seen and examined this patient, reviewed their chart. I have discussed this patient with the resident. I agree with the resident's findings, assessment and care plan. Bradycardia overnight with low of 38. I have reviewed all of his EKG in chart--repolarization is always present. No prior bradycardia to this rate.Marland Kitchen He is asymptomatic. ? If pain medications we are using for r his cough persistent chest wall pain secondary to cough  Are contributing. No cardiac symptoms. Not on beta blocker or other CV med. Will check one set of cardiac enzymes to eval for occult silent cardiac process and follow.

## 2012-05-18 NOTE — Progress Notes (Signed)
Brief Progress Note:  Was called by nurse for BP: 196/101 different from BP in the 130's systolic throughout hospitalization. Patient denied any chest pressure, only occasional sharp pains that have been present since yesterday. No headache. No shortness of breath. On exam: repeat BP: 190/110 General: anxious appearing, no acute distress CV: S1S2, rrr Pulm: normal work of breathing, no wheezing, crackles in the bases bilaterally Neuro: CN2-12 grossly intact, 4/5 strength in upper extremities bilaterally, no dysmetria.  Alert and oriented x3 A/P: 46 yo male with h/o alcohol and substance abuse, COPD who had sudden increase in BP. This could potentially be autonomic instability from alcohol withdrawal.  - check EKG and cardiac enzyme x1 - hydralazine 5mg  - place on CIWA protocol with ativan as needed - get neuro checks q4hr to evaluate for neuro deficits from sudden increase in BP - monitor BP and HR closely.   Marena Chancy, PGY-2 [facility] Intern pager: [phone]

## 2012-05-18 NOTE — Progress Notes (Signed)
Entered pt's room to administer medication and perform neuro check.  Pt with flat affect.  Began inquiring in regards to conversation with MD when pt's wife stated that pt has become very forgetful during this admission.  Pt had previously stated to me that he has been forgetful for some time.  Pts wife also stated that pt had developed a stutter since admission.  I had not previously noted any difficulty with word pronunciation. Pt began to stutter heavily after wife's statement.  When pt was asked to grip my fingers, pt was able to grip but unable to squeeze. When testing plantar flexion/dorsiflexion, pt unable to move feet.  No drift noted.  Pt states that he feels "numb all over" and that he can tell the left side of his body is weaker than the right.  CIWA score = 2.  MD notified of assessment.  Will continue to monitor.  Jaylenn Altier, [facility]

## 2012-05-18 NOTE — Progress Notes (Signed)
Brief Progress Note: Was called about  1hr ago about patient's blood pressure dropping to the 90's systolic after it being in the 190's systolic 30 minutes prior. Also concern by RN for patient not being able to lift legs and complaining of left sided numbness. When talking with patient in the room, he complained of slightly decreased sensation in the right upper extremity.  On exam, BP on the same arm (left side), with the same cuff was 90/62, HR:72 Neuro exam: 4+/5 strength in lower extremities bilaterally, hand grip strength unchanged from prior bilaterally, with similar poor effort compared to prior. CN2-12 grossly intact. asymmetry of left cheek when asked to puff out cheeks, but unchanged from prior and symmetric smile with no facial droop noted.  Alert and oriented x4.  A/P:   Fluctuation in BP is unusual. Patient had barely received hydralazine 5 when BP dropped. This could still be autonomic instability from withdrawal, but will closely monitor for any other signs that may point to vascular or neurological etiology. No focal deficit on neuro exam at this point. Will hold on head CT for now, but will closely monitor for any worsening clinical signs.  Will continue to monitor BP closely.  Continue with previous plan.   Marena Chancy, PGY-2 [facility]

## 2012-05-18 NOTE — Progress Notes (Signed)
Family Medicine Teaching Service Daily Progress Note Intern Pager: [phone]  Patient name: David Benson Medical record number: [id number] Date of birth: 08-07-1966 Age: 46 y.o. Gender: male  Primary Care Provider: Pcp Not In System  Subjective: Pt seen at bedside this morning. Pt states he feels better, today. Cough is less severe and less painful, breathing is easier. Pt states IS has helped but deep breathing is occasionally painful, still. No abdominal pain but does endorse some occasional heartburn.  Objective: Temp:  [98.4 F (36.9 C)] 98.4 F (36.9 C) (07/28 0600) Pulse Rate:  [38-66] 38  (07/28 0600) Resp:  [18-20] 20  (07/28 0600) BP: (113-130)/(76-86) 130/77 mmHg (07/28 0600) SpO2:  [95 %-100 %] 100 % (07/28 0738) Weight:  [181 lb (82.1 kg)] 181 lb (82.1 kg) (07/28 0600) Exam: Pulse: 38 per EMR at 0600, rechecked during my exam at 52. General: Pt sitting up in bed, in NAD, pleasant and cooperative; voice much less hoarse and pt appears less winded than previous days Cardiovascular: bradycardic, but otherwise normal sounds and no murmur appreciated Respiratory: normal work of breathing without retractions, good air movement bilaterally with occasional scattered expiratory wheezes; rhonchi no longer present; soft crackles at bilateral lung bases Abdomen: soft with minimal tenderness; some epigastric/substernal burning on palpation Extremities: moves extremities equally/spontaneously, distal pulses 2+ and intact/symmetric  Laboratory:  Lab 05/18/12 0550 05/17/12 0610 05/16/12 0915  WBC 8.8 13.2* 11.6*  HGB 13.8 13.1 14.3  HCT 42.2 38.1* 42.7  PLT 270 234 207    Lab 05/18/12 0550 05/17/12 0610 05/16/12 0505 05/13/12 0525  NA 140 139 138 --  K 4.8 4.2 4.2 --  CL 98 98 97 --  CO2 33* 31 31 --  BUN 24* 15 12 --  CREATININE 1.26 1.08 1.29 --  CALCIUM 9.5 9.4 9.3 --  PROT -- -- 6.5 6.1  BILITOT -- -- 0.2* 0.2*  ALKPHOS -- -- 74 60  ALT -- -- 16 17  AST -- -- 26 22    GLUCOSE 127* 163* 171* --    Lab 05/13/12 0525  CKTOTAL 413*  TROPONINI <0.30  TROPONINT --  CKMBINDEX --   UDS, 7/23: POSITIVE for cocaine, THC Legionella antigen, 7/23: negative  Imaging/Diagnostic Tests: CXR, 7/22 @1816  IMPRESSION:  1. Right greater left perihilar airspace opacity potentially  reflecting pneumonia, aspiration pneumonitis, or pulmonary  mass/adenopathy. Follow-up radiography in 1 month after treatment  is recommended to ensure clearance and exclude malignancy.  CTA, Chest, 7/23 @1738  Findings: The pulmonary arteries are adequately opacified. There  is no evidence of pulmonary embolism. Multilobar airspace disease  is present in both lungs. The most dense airspace disease is in the  right upper lobe. Findings are most likely consistent with  multifocal pneumonia. There is no evidence of associated pleural  fluid. Heart size is normal. No pericardial effusion. No  enlarged lymph nodes are identified. The thoracic aorta is of  normal caliber. No bony abnormalities.  IMPRESSION:  Multilobar airspace disease present throughout both lungs.  Findings are most likely consistent with multifocal pneumonia.  Portable CXR, 7/25 @1048  Findings: Bilateral diffuse alveolar infiltrates are identified and  have worsened in comparison with both the prior chest x-ray and  recent CT. Findings are again most suspicious for worsening  bilateral pneumonia.  Heart size is likely within normal limits. The mediastinum is  obscured by adjacent confluent alveolar infiltrates.  Metallic BB noted overlying the upper central abdomen.  Bony structures appear intact.  IMPRESSION:  Worsening bilateral  diffuse alveolar infiltrates concerning for  worsening multifocal pneumonia.  Portable CXR, 7/26 @0624  IMPRESSION:  1. Improving multifocal interstitial and airspace disease, favored  to represent decreasing multilobar pneumonia.  Portable CXR, 7/27 @0625  IMPRESSION:  Stable  symmetric interstitial and airspace disease, suspicious for  pulmonary edema, or less likely atypical infection.  Assessment and Plan: David Benson is a 46yo male with PMH of asthma/COPD (current smoker), and tobacco/alcohol/other drug abuse presenting with 1 week of cough and worsening SOB with associated chest/back pain, subjective fever, sweating, body aches, and congestion, found to have multifocal pneumonia. Admitted on 7/23, transferred to ICU/stepdown on 7/25 due to acutely worsened respiratory status, then back to tele bed on 7/26.  ## Bradycardia - Down to 30's sustained overnight on 7/27-7/28. New to be this slow, though pt has been high-50's to low-60's during this admission. EKG 7/27 @0218  with slightly increased QTc from previous tracing, as well as U-waves. Pt did receive GI cocktail containing lidocaine at 1727 on 7/27. Pulse during physical exam on 7/28 @0900  is 52. Pt without diziness/lightheadedness, but does feel tired. No chest pain other than slight muscle wall tenderness, reproducible on palpation, not new from pain previously attributed to heavy cough. CE x1 negative. PLAN - Continue to monitor. GI cocktail switched to plain Mylanta to avoid any more lidocaine.   1. Pulmonary - Pt with mixed asthma/COPD exacerbation and findings on CTA 7/23 consistent with multifocal pneumonia. Diffuse wheezes on exam at time of admission but no measured elevated temperature or elevated WBC. Initially started on Ceftriaxone and azithromycin as well as prednisone 50 mg daily; escalated to Vanc and Zosyn as pt became clinically worse. Legionella initially considered, but antigen was negative. HIV negative as well. Pulmonology consulted 7/24 (Dr. Molli Knock); echo checked was normal, pt continued on IV abx. Pt transferred to ICU/stepdown on 7/25 for acutely worse breathing with desats to 70's, improved with gentle diuresis and switch to IV steroids with addition of Levoquin. Vanc/Zosyn stopped on 7/25.  Concern now is for hypersensitivity pneumonitis possibly related to illicit substance use +/- aspiration pneumonia. Appreciate CCM/pulmonary recommendations and assistance with management. Pt with improving pulmonary function continuing on 7/28, CXR without worsening edema/consolidation. PLAN -  Continue Levoquin for 8 total days (started 7/25 --> last dose to be 8/1). Wean O2 as tolerated. Low threshold for restarting Lasix.  2. Chest/back/abdominal pain - Most likely secondary to asthma/COPD exacerbation and musculoskeletal pain from severe cough. POC troponin and CE panel both negative. EKG without ST changes at time of admission. Probable component of GERD and/or alcoholic gastritis, as well. Pain continued 7/24, especially along with cough, pt stating morphine IV not as helpful as Dilaudid received in the ED, and Norco even less helpful. Improving with cough. PLAN - Scheduled Toradol. Norco 5-235 q6 PRN; will limit any further narcotics, especially as pain seems to be improving. Continue Protonix. Zofran for nausea.  3. Diarrhea - Complaint at time of admission, for a few days prior to admission. No current/further complaints; most likely mild viral gastroenteritis though possible component of alcoholic gastritis. Will continue to monitor.  4. Polysubstance abuse - Pt is a current smoker, 1-2 packs per day. Also drinks 1-2 40-oz alcoholic drinks per day (last drink 7/21). Remote history of other substance abuse, but none admitted recently. UDS positive for cocaine, THC, though pt adamantly denies recent use. Tobacco cessation counseling was ordered. SW consulted as well, appreciated; pt does not believe he has any substance abuse problems and states that he "  drinks casually" and is not interested in cessation resources, but does want to follow-up in [facility] clinic. This may be better addressed as an outpt.  FEN/GI: regular diet PPx: Protonix, Lovenox Dispo: home pending improvement Code Status: Full  Code  Avalene Sealy, [place], MD 05/18/2012, 9:11 AM

## 2012-05-19 ENCOUNTER — Telehealth: Payer: Self-pay | Admitting: Family Medicine

## 2012-05-19 LAB — CBC
HCT: 40 % (ref 39.0–52.0)
RDW: 13.5 % (ref 11.5–15.5)
WBC: 7.7 10*3/uL (ref 4.0–10.5)

## 2012-05-19 LAB — BASIC METABOLIC PANEL
BUN: 26 mg/dL — ABNORMAL HIGH (ref 6–23)
Chloride: 99 mEq/L (ref 96–112)
GFR calc Af Amer: 79 mL/min — ABNORMAL LOW (ref >90)
Potassium: 4.7 mEq/L (ref 3.5–5.1)

## 2012-05-19 LAB — HEMOGLOBIN A1C: Mean Plasma Glucose: 134 mg/dL — ABNORMAL HIGH (ref <117)

## 2012-05-19 MED ORDER — FOLIC ACID 1 MG PO TABS: 1.0000 mg | ORAL_TABLET | Freq: Every day | ORAL | Status: AC

## 2012-05-19 MED ORDER — MAGIC MOUTHWASH: 10.0000 mL | Freq: Two times a day (BID) | ORAL | Status: DC | PRN

## 2012-05-19 MED ORDER — ALBUTEROL SULFATE (5 MG/ML) 0.5% IN NEBU
2.5000 mg | INHALATION_SOLUTION | Freq: Two times a day (BID) | RESPIRATORY_TRACT | Status: DC
  Administered 2012-05-19: 2.5 mg via RESPIRATORY_TRACT
  Filled 2012-05-19: qty 0.5

## 2012-05-19 MED ORDER — MAGIC MOUTHWASH
10.0000 mL | Freq: Two times a day (BID) | ORAL | Status: DC | PRN
  Filled 2012-05-19: qty 10

## 2012-05-19 MED ORDER — NYSTATIN 100000 UNIT/GM EX POWD
Freq: Two times a day (BID) | CUTANEOUS | Status: DC
  Filled 2012-05-19: qty 15

## 2012-05-19 MED ORDER — IBUPROFEN 800 MG PO TABS: 800.0000 mg | ORAL_TABLET | Freq: Three times a day (TID) | ORAL | Status: AC

## 2012-05-19 MED ORDER — ALBUTEROL SULFATE HFA 108 (90 BASE) MCG/ACT IN AERS: 2.0000 | INHALATION_SPRAY | Freq: Four times a day (QID) | RESPIRATORY_TRACT | Status: DC | PRN

## 2012-05-19 MED ORDER — HYDROCODONE-ACETAMINOPHEN 5-325 MG PO TABS: 1.0000 | ORAL_TABLET | Freq: Three times a day (TID) | ORAL | Status: DC | PRN

## 2012-05-19 MED ORDER — LEVOFLOXACIN 750 MG PO TABS
750.0000 mg | ORAL_TABLET | ORAL | Status: DC
  Administered 2012-05-19: 750 mg via ORAL
  Filled 2012-05-19: qty 1

## 2012-05-19 MED ORDER — LEVOFLOXACIN 750 MG PO TABS: 750.0000 mg | ORAL_TABLET | ORAL | Status: DC

## 2012-05-19 MED ORDER — IPRATROPIUM BROMIDE 0.02 % IN SOLN
0.5000 mg | Freq: Two times a day (BID) | RESPIRATORY_TRACT | Status: DC
  Administered 2012-05-19: 0.5 mg via RESPIRATORY_TRACT
  Filled 2012-05-19: qty 2.5

## 2012-05-19 MED ORDER — PREDNISONE 50 MG PO TABS
60.0000 mg | ORAL_TABLET | Freq: Every day | ORAL | Status: DC
  Filled 2012-05-19: qty 1

## 2012-05-19 MED ORDER — OXYCODONE-ACETAMINOPHEN 5-325 MG PO TABS: 1.0000 | ORAL_TABLET | ORAL | Status: AC | PRN

## 2012-05-19 MED ORDER — IBUPROFEN 800 MG PO TABS
800.0000 mg | ORAL_TABLET | Freq: Three times a day (TID) | ORAL | Status: DC
  Administered 2012-05-19: 800 mg via ORAL
  Filled 2012-05-19 (×3): qty 1

## 2012-05-19 MED ORDER — NYSTATIN 100000 UNIT/GM EX POWD
Freq: Two times a day (BID) | CUTANEOUS | Status: DC | PRN
  Filled 2012-05-19: qty 15

## 2012-05-19 MED ORDER — PREDNISONE 20 MG PO TABS: ORAL_TABLET | ORAL | Status: DC

## 2012-05-19 NOTE — Progress Notes (Addendum)
Reviewed discharge instructions with patient and wife, they stated their instructions.  Wife stated she should be able to afford medications for patient at this time, will take prescriptions to regular pharmacy.  Patient being discharged home on home oxygen, reviewed by [facility].  Colman Cater   Just received phone call from patient and wife stated they can not afford price of medications.  Patient and wife instructed to verify with [facility] which medications were most expensive, and the prednisone and albuterol inhaler were the most important per MD.  Dr. Thompson Grayer paged and notified and to verify if she can call in a different antibiotic.  Colman Cater

## 2012-05-19 NOTE — Discharge Summary (Signed)
Discharge Summary 05/19/2012 4:38 PM  David Benson DOB: Nov 09, 1965 MRN: [id number]  Date of Admission: 05/12/2012 Date of Discharge:   PCP: Burnis Medin, MD Consultants: Pulmonary  Reason for Admission: Shortness of breath and pneumonia  Discharge Diagnosis Primary 1. Bilateral pneumonia, community acquired, with superimposed pneumonitis 2. Non-cardiogenic pulmonary edema Secondary 1. Asthma 2. Cocaine Abuse 3. Alcohol Abuse 4. [facility] Abuse  Hospital Course: David Benson is a 46yo male with PMH of asthma/COPD (current smoker), and tobacco/alcohol/other drug abuse presenting with 1 week of cough and worsening SOB with associated chest/back pain, subjective fever, sweating, body aches, and congestion, found to have multifocal pneumonia. Admitted on 7/23, transferred to ICU/stepdown on 7/25 due to acutely worsened respiratory status, then back to tele bed on 7/26. Details as below.  1. Bilateral pneumonia/pneumonitis: Pt with mixed asthma/COPD exacerbation and findings on CTA 7/23 consistent with multifocal pneumonia. Initially started on Ceftriaxone and azithromycin as well as prednisone 50 mg daily; escalated to Vanc and Zosyn as pt became clinically worse. Legionella antigen was negative. HIV negative as well.  Pt transferred to ICU/stepdown on 7/25 for acutely worse breathing with desats to 70's, improved with gentle diuresis and switch to IV steroids. 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 At the time of discharge, the patient had normal O2 sats on room air at rest but did desaturate to the mid 80s with ambulation.  This was corrected with 1L [place].  We would anticipate that this requirement would decrease with time and would hopefully resolve within 4-6 weeks.  This is a direct result of his extensive pneumonia. The patient was discharged with Rx for both a  steroid taper and 3 additional days of Levaquin to cover both an infectious and an inflammatory process. This was done in consultation with pulmonary. Pt also instructed to use albuterol inhaler for wheezing/SOB and otc meds for cough, as needed.  2. Non-cardiogenic pulmonary edema: As above, possibly due to illicit drug use. Improved with gentle diuresis on 7/25 and further treatment of pneumonia with abx. Desat to 80's with ambulation, quickly corrected with 1L [place]. Expected to improve with pneumonia.  3. Hemodynamic instability: Pulse down to 30's sustained overnight on 7/27-7/28. New to be this slow, though pt has been high-50's to low-60's during this admission. EKG 7/27 @0218  with slightly increased QTc from previous tracing, as well as subtle U-waves. Pt did receive GI cocktail containing lidocaine at 1727 on 7/27. Pt with BP fluctuations on 7/28, manually checked 180's/100's around 0600, then 90's/60's with only 5 mg of hydralazine IV; pt with inconsistent neuro exams between examiners (RN vs two MDs), around that time as well; please see interval notes on 7/29 by Dr. Marena Chancy for other details. Neuro exam was normal and vital signs more stable on 7/29 at discharge.  CE x3 negative. 2D echo in ICU/stepdown was normal, as well. Will consider cardiology outpt referral if pt has any further symptoms or problems.   4. Chest/back/abdominal pain - Most likely secondary to asthma/COPD exacerbation and musculoskeletal pain from severe cough. POC troponin and CE panel both negative. EKG without ST changes at time of admission. Probable component of GERD and/or alcoholic gastritis, as well. Treated with Toradol and PRN Norco. Improving with cough, almost completely resolved at discharge. Pt instructed to take ibuprofen as needed and prescribed short course of Percocet, at discharge.  3. Candidiasis - Presumed, with white spots on buccal  mucosa as well as round, slightly raised, itchy lesion in groin. Pt  has been on multiple antibiotics for multiple days. Magic mouthwash BID PRN and nystatin powder BID PRN ordered on last day of discharge..   4. Diarrhea - Complaint at time of admission, for a few days prior to admission. No current/further complaints throughout hospital stay; most likely mild viral gastroenteritis though possible component of alcoholic gastritis.  5. Polysubstance abuse - Pt is a current smoker, 1-2 packs per day. Also drinks 1-2 40-oz alcoholic drinks per day (last drink 7/21). Remote history of other substance abuse, but none admitted recently. UDS positive for cocaine, THC, though pt adamantly denies recent use. Tobacco cessation counseling was ordered. SW consulted as well, appreciated; pt does not believe he has any substance abuse problems and states that he "drinks casually" and is not interested in cessation resources, but does want to follow-up in [facility] clinic. This may be better addressed as an outpt.  Procedures: Echo performed 05/15/12 demonstrated EF 55% with no systolic or diastolic dysfunction  Discharge Medications Medication List  As of 05/19/2012  4:38 PM   START taking these medications         albuterol 108 (90 BASE) MCG/ACT inhaler   Commonly known as: PROVENTIL HFA;VENTOLIN HFA   Inhale 2 puffs into the lungs every 6 (six) hours as needed for wheezing.      folic acid 1 MG tablet   Commonly known as: FOLVITE   Take 1 tablet (1 mg total) by mouth daily.      ibuprofen 800 MG tablet   Commonly known as: ADVIL,MOTRIN   Take 1 tablet (800 mg total) by mouth every 8 (eight) hours.      levofloxacin 750 MG tablet   Commonly known as: LEVAQUIN   Take 1 tablet (750 mg total) by mouth daily.      oxyCODONE-acetaminophen 5-325 MG per tablet   Commonly known as: PERCOCET/ROXICET   Take 1 tablet by mouth every 4 (four) hours as needed for pain.      predniSONE 20 MG tablet   Commonly known as: DELTASONE   Three pills by mouth daily for 5 days then 2 pills by  mouth daily for 3 days then 1 pill by mouth for 3 days then 1/2 pill by mouth for 4 days.         CONTINUE taking these medications         ALKA-SELTZER PLS SINUS & COUGH 10-5-325 MG Caps   Generic drug: DM-Phenylephrine-Acetaminophen      MUCINEX PO      TYLENOL CHEST CONGESTION 500-200 MG/15ML Liqd   Generic drug: Acetaminophen-Guaifenesin          Where to get your medications    These are the prescriptions that you need to pick up. We sent them to a specific pharmacy, so you will need to go there to get them.   [id number] - [place] ([place]), [place] - [address]    [address] [place] ([place] [address]    Phone: [phone]        albuterol 108 (90 BASE) MCG/ACT inhaler   folic acid 1 MG tablet   ibuprofen 800 MG tablet         You may get these medications from any pharmacy.         levofloxacin 750 MG tablet   oxyCODONE-acetaminophen 5-325 MG per tablet   predniSONE 20 MG tablet  Pertinent Hospital Labs  Lab 05/19/12 0630 05/18/12 0550 05/17/12 0610  WBC 7.7 8.8 13.2*  HGB 13.4 13.8 13.1  HCT 40.0 42.2 38.1*  PLT 260 270 234    Lab 05/19/12 0630 05/18/12 0550 05/17/12 0610  NA 136 140 139  K 4.7 4.8 4.2  CL 99 98 98  CO2 30 33* 31  BUN 26* 24* 15  CREATININE 1.25 1.26 1.08  LABGLOM -- -- --  GLUCOSE 174* -- --  CALCIUM 8.9 9.5 9.4   UDS, 7/23: POSITIVE for cocaine, THC Legionella antigen, 7/23: negative  CTA performed 05/13/12: IMPRESSION:  Multilobar airspace disease present throughout both lungs.  Findings are most likely consistent with multifocal pneumonia.  Chest XRay performed 05/17/12: IMPRESSION:  Stable symmetric interstitial and airspace disease, suspicious for  pulmonary edema, or less likely atypical infection.  Discharge instructions: Please see discharge section in EMR for full discharge instructions.  Instructed to call for fever, worsening shortness of breath, or any other concerns.  Condition  at discharge: stable  Disposition: Home  Pending Tests: None  Follow up: Follow-up Information    Follow up with [facility] on 06/03/2012. (at 3:30 pm - Please arrive 10 minutes prior to appointment)    Contact information:   [address] [place] [address] [phone]        Follow up Issues:  - F/u breathing/cough issues. Potentially discuss substance abuse; pt did adamantly deny any illicit substance use despite UDS findings on admission, denied feeling he has a problem with EtOH though appears to have a fairly heavy use history. - F/u plan for medications; discharged with Rx for Levoquin and steroid taper. Unable to afford meds, abx plan changed to CTX 1g IM for 3 doses and azithromycin 500 mg PO for 3 doses, scheduled as nursing visits 7/30-8/1. Discuss need for steroids. - F/u other complaints (itching in groin, white spots on buccal mucosa); ?magic mouthwash and nystatin powder ordered last day of admission, may not have gotten while in hospital. - Give patient information on [employer] - F/u with [facility], Elmus Mathes to be PCP  Bobbye Morton, MD PGY-1, [facility] Family Medicine 9:49 AM 7/30

## 2012-05-19 NOTE — Progress Notes (Addendum)
Family Medicine Teaching Service Daily Progress Note Intern Pager: [phone]  Patient name: David Benson Medical record number: [id number] Date of birth: Mar 17, 1966 Age: 46 y.o. Gender: male  Primary Care Provider: Pcp Not In System  Subjective: Pt seen at bedside this morning. Pt states he feels okay. Still not sleeping well, but only coughing occasionally. Not complaining of as much pain as previously. No more subjective feelings of weakness, able to ambulate. No chest pain/dizziness/lightheadedness.  Objective: Temp:  [97.8 F (36.6 C)-98.6 F (37 C)] 98.2 F (36.8 C) (07/29 0800) Pulse Rate:  [48-82] 82  (07/29 0800) Resp:  [18-20] 20  (07/29 0800) BP: (92-196)/(53-102) 156/70 mmHg (07/29 0800) SpO2:  [92 %-99 %] 94 % (07/29 0833) Exam: General: Pt sitting up in chair, in NAD, speech normal and affect not flat, without speech slowing like last night Cardiovascular: RRR, no rub/murmur appreciated Respiratory: normal work of breathing without retractions, good air movement bilaterally with soft bibasilar crackles Extremities: pt initially standing, walking in room, sitting down as I entered; distal pulses 2+ and intact/symmetric Neuro: no gross focal deficits, no unsteadiness on feet, strength 5/5 in grip and in LE bilaterally, sensation grossly intact  Laboratory:  Lab 05/19/12 0630 05/18/12 0550 05/17/12 0610  WBC 7.7 8.8 13.2*  HGB 13.4 13.8 13.1  HCT 40.0 42.2 38.1*  PLT 260 270 234    Lab 05/19/12 0630 05/18/12 0550 05/17/12 0610 05/16/12 0505 05/13/12 0525  NA 136 140 139 -- --  K 4.7 4.8 4.2 -- --  CL 99 98 98 -- --  CO2 30 33* 31 -- --  BUN 26* 24* 15 -- --  CREATININE 1.25 1.26 1.08 -- --  CALCIUM 8.9 9.5 9.4 -- --  PROT -- -- -- 6.5 6.1  BILITOT -- -- -- 0.2* 0.2*  ALKPHOS -- -- -- 74 60  ALT -- -- -- 16 17  AST -- -- -- 26 22  GLUCOSE 174* 127* 163* -- --    Lab 05/18/12 1823 05/18/12 0835 05/13/12 0525  CKTOTAL 144 145 413*  TROPONINI <0.30 <0.30  <0.30  TROPONINT -- -- --  CKMBINDEX -- -- --   UDS, 7/23: POSITIVE for cocaine, THC Legionella antigen, 7/23: negative  Imaging/Diagnostic Tests: CXR, 7/22 @1816  IMPRESSION:  1. Right greater left perihilar airspace opacity potentially  reflecting pneumonia, aspiration pneumonitis, or pulmonary  mass/adenopathy. Follow-up radiography in 1 month after treatment  is recommended to ensure clearance and exclude malignancy.  CTA, Chest, 7/23 @1738  Findings: The pulmonary arteries are adequately opacified. There  is no evidence of pulmonary embolism. Multilobar airspace disease  is present in both lungs. The most dense airspace disease is in the  right upper lobe. Findings are most likely consistent with  multifocal pneumonia. There is no evidence of associated pleural  fluid. Heart size is normal. No pericardial effusion. No  enlarged lymph nodes are identified. The thoracic aorta is of  normal caliber. No bony abnormalities.  IMPRESSION:  Multilobar airspace disease present throughout both lungs.  Findings are most likely consistent with multifocal pneumonia.  Portable CXR, 7/25 @1048  Findings: Bilateral diffuse alveolar infiltrates are identified and  have worsened in comparison with both the prior chest x-ray and  recent CT. Findings are again most suspicious for worsening  bilateral pneumonia.  Heart size is likely within normal limits. The mediastinum is  obscured by adjacent confluent alveolar infiltrates.  Metallic BB noted overlying the upper central abdomen.  Bony structures appear intact.  IMPRESSION:  Worsening bilateral  diffuse alveolar infiltrates concerning for  worsening multifocal pneumonia.  Portable CXR, 7/26 @0624  IMPRESSION:  1. Improving multifocal interstitial and airspace disease, favored  to represent decreasing multilobar pneumonia.  Portable CXR, 7/27 @0625  IMPRESSION:  Stable symmetric interstitial and airspace disease, suspicious for  pulmonary  edema, or less likely atypical infection.  Assessment and Plan: David Benson is a 46yo male with PMH of asthma/COPD (current smoker), and tobacco/alcohol/other drug abuse presenting with 1 week of cough and worsening SOB with associated chest/back pain, subjective fever, sweating, body aches, and congestion, found to have multifocal pneumonia. Admitted on 7/23, transferred to ICU/stepdown on 7/25 due to acutely worsened respiratory status, then back to tele bed on 7/26.  ## Hemodynamic instability - Pulse down to 30's sustained overnight on 7/27-7/28. New to be this slow, though pt has been high-50's to low-60's during this admission. EKG 7/27 @0218  with slightly increased QTc from previous tracing, as well as subtle U-waves. Pt did receive GI cocktail containing lidocaine at 1727 on 7/27. Pt with BP fluctuations on 7/28, manually checked 180's/100's around 0600, then 90's/60's with only 5 mg of hydralazine IV; pt with inconsistent neuro exams between examiners (RN vs two MDs), around that time as well; please see interval notes on 7/29 by Dr. Marena Chancy for other details. Neuro exam normal and vital signs more stable on 7/29. No chest pain other than slight muscle wall tenderness, reproducible on palpation, not new from pain previously attributed to heavy cough, continues. CE x2 negative. 2D echo in ICU/stepdown was normal, as well. PLAN - Will check CE x1 more. Continue to monitor. Will also consider cardiology consult vs outpt referral. GI cocktail switched to plain Mylanta to avoid any more lidocaine.   1. Pulmonary - Pt with mixed asthma/COPD exacerbation and findings on CTA 7/23 consistent with multifocal pneumonia. Diffuse wheezes on exam at time of admission but no measured elevated temperature or elevated WBC. Initially started on Ceftriaxone and azithromycin as well as prednisone 50 mg daily; escalated to Vanc and Zosyn as pt became clinically worse. Legionella initially considered, but  antigen was negative. HIV negative as well. Pulmonology consulted 7/24 (Dr. Molli Knock); echo checked was normal, pt continued on IV abx. Pt transferred to ICU/stepdown on 7/25 for acutely worse breathing with desats to 70's, improved with gentle diuresis and switch to IV steroids with addition of Levoquin. Vanc/Zosyn stopped on 7/25. Concern now is for hypersensitivity pneumonitis possibly related to illicit substance use +/- aspiration pneumonia. Appreciate CCM/pulmonary recommendations and assistance with management. Pulmonary status continues to improve, 7/29. PLAN -  Continue Levoquin for 8 total days (started 7/25 --> last dose to be 8/1). Wean O2 as tolerated; will check O2 sat walking. Solu-Medrol discontinued, will start prednisone 60 mg tomorrow for slow taper over 2 weeks.   2. Chest/back/abdominal pain - Most likely secondary to asthma/COPD exacerbation and musculoskeletal pain from severe cough. POC troponin and CE panel both negative. EKG without ST changes at time of admission. Probable component of GERD and/or alcoholic gastritis, as well. Pain continued 7/24, especially along with cough, pt stating morphine IV not as helpful as Dilaudid received in the ED, and Norco even less helpful. Improving with cough. PLAN -  Will transition toradol IV to oral ibuprofen scheduled. Norco 5-235 q6 PRN; will limit any further narcotics, and begin to wean. Continue Protonix. Zofran for nausea.  3. Candidiasis - Presumed, with white spots on buccal mucosa as well as round, slightly raised, itchy lesion in groin. Pt has  been on multiple antibiotics for multiple days. Magic mouthwash BID PRN and nystatin powder BID PRN.  4. Diarrhea - Complaint at time of admission, for a few days prior to admission. No current/further complaints; most likely mild viral gastroenteritis though possible component of alcoholic gastritis. Will continue to monitor.  5. Polysubstance abuse - Pt is a current smoker, 1-2 packs per day.  Also drinks 1-2 40-oz alcoholic drinks per day (last drink 7/21). Remote history of other substance abuse, but none admitted recently. UDS positive for cocaine, THC, though pt adamantly denies recent use. Tobacco cessation counseling was ordered. SW consulted as well, appreciated; pt does not believe he has any substance abuse problems and states that he "drinks casually" and is not interested in cessation resources, but does want to follow-up in [facility] clinic. This may be better addressed as an outpt.  FEN/GI: regular diet PPx: Protonix, Lovenox Dispo: home pending improvement Code Status: Full Code  Street, Bradley, MD 05/19/2012, 9:43 AM   ** I have seen and examined this patient, reviewed their chart. I have discussed this patient with the resident. I agree with the resident's findings, assessment and care plan.   No further episodes of bradycardia.  Repeat cardiac enzymes have been negative.  Patient up, walking around, speaking coherently today.  Much less likely he suffered TIA this weekend, neuro status was good even when blood pressure dropped after hydralazine administration.  Patient currently afebrile and doing well off oxygen.  Plan to DC home today with FU at [facility] on outpatient basis.    Tobey Grim, MD 05/19/2012.1:49 PM

## 2012-05-19 NOTE — Progress Notes (Signed)
SATURATION QUALIFICATIONS:  Patient Saturations on Room Air at Rest = 95-99%  Patient Saturations on Room Air while Ambulating =  85%  Patient Saturations on 1 Liters of oxygen while Ambulating = 95-99%  Statement of medical necessity for home oxygen:

## 2012-05-19 NOTE — Progress Notes (Signed)
David Benson will require O2 while ambulating (1L [place]) in the immediate future as he recovers from his extensive bilateral pneumonia.  It would be my hope that he would be able to come off of this within 4-6 weeks.  [name] 05/19/2012, 4:32 PM

## 2012-05-19 NOTE — Progress Notes (Signed)
PHARMACIST - PHYSICIAN COMMUNICATION DR:   FMTS CONCERNING: Antibiotic IV to Oral Route Change Policy  RECOMMENDATION: This patient is receiving levofloxacin by the intravenous route.  Based on criteria approved by the Pharmacy and Therapeutics Committee, the antibiotic(s) is/are being converted to the equivalent oral dose form(s).   DESCRIPTION: These criteria include:  Patient being treated for a respiratory tract infection, urinary tract infection, or cellulitis  The patient is not neutropenic and does not exhibit a GI malabsorption state  The patient is eating (either orally or via tube) and/or has been taking other orally administered medications for a least 24 hours  The patient is improving clinically and has a Tmax < 100.5  If you have questions about this conversion, please contact the Pharmacy Department  []   [phone] )  [facility] [x]   [phone] )  [facility]  []   [phone] )  [facility] []   [phone] )  Ilene Qua    Juliette Alcide, PharmD, BCPS.  Pager: [phone] 05/19/2012 11:45 AM

## 2012-05-19 NOTE — Telephone Encounter (Signed)
Patient's nurse called informing that discharge meds would cost patient $150, including levaquin which would cost $50.  Nurse let patient know he could get ibuprofen lower dosage for less money, and contacted MD regarding other prescriptions. Called patient and informed that albuterol was the priority for tonight as he has already had his daily dose of levaquin.  He said albuterol was around $60 but he thinks his mother has one he can use tonight.  Informed patient that if he was not able to get albuterol and had SOB or difficulty breathing tonight, he needed to come back to ED.    Simone Curia 05/19/2012 8:13 PM

## 2012-05-19 NOTE — Plan of Care (Signed)
Problem: Discharge Progression Outcomes Goal: O2 sats at patient's baseline Outcome: Adequate for Discharge Patient requiring home oxygen at discharge with activity

## 2012-05-20 ENCOUNTER — Other Ambulatory Visit: Payer: Self-pay | Admitting: Family Medicine

## 2012-05-20 ENCOUNTER — Encounter: Payer: Self-pay | Admitting: Family Medicine

## 2012-05-20 ENCOUNTER — Ambulatory Visit (INDEPENDENT_AMBULATORY_CARE_PROVIDER_SITE_OTHER): Payer: Self-pay | Admitting: Family Medicine

## 2012-05-20 ENCOUNTER — Ambulatory Visit: Payer: 59

## 2012-05-20 ENCOUNTER — Telehealth: Payer: Self-pay | Admitting: Family Medicine

## 2012-05-20 VITALS — BP 112/75 | HR 80 | Temp 98.2°F | Wt 186.3 lb

## 2012-05-20 DIAGNOSIS — J189 Pneumonia, unspecified organism: Secondary | ICD-10-CM

## 2012-05-20 DIAGNOSIS — R21 Rash and other nonspecific skin eruption: Secondary | ICD-10-CM

## 2012-05-20 DIAGNOSIS — Z9189 Other specified personal risk factors, not elsewhere classified: Secondary | ICD-10-CM | POA: Insufficient documentation

## 2012-05-20 DIAGNOSIS — Z789 Other specified health status: Secondary | ICD-10-CM

## 2012-05-20 DIAGNOSIS — B370 Candidal stomatitis: Secondary | ICD-10-CM

## 2012-05-20 DIAGNOSIS — B354 Tinea corporis: Secondary | ICD-10-CM | POA: Insufficient documentation

## 2012-05-20 MED ORDER — PREDNISONE 20 MG PO TABS: ORAL_TABLET | ORAL | Status: DC

## 2012-05-20 MED ORDER — NYSTATIN 100000 UNIT/ML MT SUSP: 500000.0000 [IU] | Freq: Four times a day (QID) | OROMUCOSAL | Status: AC

## 2012-05-20 MED ORDER — CEFTRIAXONE SODIUM 1 G IJ SOLR: 1.0000 g | INTRAMUSCULAR | Status: DC

## 2012-05-20 MED ORDER — AZITHROMYCIN 250 MG PO TABS: 500.0000 mg | ORAL_TABLET | ORAL | Status: DC

## 2012-05-20 NOTE — Assessment & Plan Note (Signed)
Cardiac etiology less likely given CE neg x 3 and 2D echo wnl.  - Encouraged plenty of fluid intake to avoid orthostatic hypotension - F/u at next visit.  If continued symptoms, consider cardiology referral.

## 2012-05-20 NOTE — Assessment & Plan Note (Addendum)
Pt has rash in right underarm and suprapubically.  Given recent prednisone dosage, likely tinea corporis.   - Pt was sent home from hospital with nystatin powder. Continue and monitor at f/u appt.

## 2012-05-20 NOTE — Patient Instructions (Addendum)
It was good to see you today, though I am sorry you do not feel well.  Here's what we discussed in the visit.  - Please continue returning tomorrow (Wed) and Thurs for a nurse visit to get your antibiotics. - Please purchase prednisone and take as prescribed: Three pills by mouth daily for 5 days (including today), then 2 pills by mouth daily for 3 days, then 1 pill by mouth for 3 days, then 1/2 pill by mouth for 4 days.  It is important to take this and to not stop taking it without the taper described here. - Please do not smoke or inhale fumes, including drugs or other irritating smoke.  This can worsen your lung condition.   - Please keep any fire away from your oxygen machine, as this can cause an explosion. - Please set up a follow-up appointment with a provider on Friday or this coming Monday to follow-up your symptoms. - You can continue using oxygen at home as you need and we will follow-up at your next appointment. - Please follow-up with the application for the [facility] with [employer]. - Besides levaquin which we have substituted with ceftriaxone and azithromycin, please continue medications as prescribed at your discharge from the hospital yesterday. - I am prescribing nystatin oral suspension swish and swallow for your oral infection.  If you cannot afford this, it is much less of a priority than prednisone. - Drink plenty of fluids to help you not get dizzy.  - Take care and if you feel like you are unable to breathe even with breathing treatments, have worsening chest pain, dizziness, fainting, or fever, please call 911 and go to the emergency room.

## 2012-05-20 NOTE — Progress Notes (Signed)
This encounter was created in error - please disregard.

## 2012-05-20 NOTE — Progress Notes (Signed)
Subjective:     Patient ID: David Benson, male   DOB: 1966-10-01, 46 y.o.   MRN: [id number]  HPI Pt is a 46yo male here for a hospital f/u and for administration of antibiotics in clinic.  Pt discharged with diagnosis of bilateral community acquired pneumonia with superimposed pneumonitis.  Respiratory f/u: Since d/c, patient was unable to afford medications (no insurance, total meds would cost $150) and states he did not fill any prescriptions.  Had called into hospital yesterday and when contacted, stated he could get albuterol from his mother but could not afford levaquin and prednisone.  At home, required increased O2 (2.5L) overnight and had one episode of dizziness and SOB requiring 2 puffs of albuterol and sitting down; denies LOC.  Today comes in on 1L and feels he is somewhat SOB.  Came into clinic today and received ceftriaxone IM and azithromycin PO.  Continues having occasional cough with pain in his chest, abdomen, and back muscles when he coughs.  Uses incentive spirometer to help with breath control when needs to cough.  Also complains of itching and new rash in right armpit starting last night, and the same suprapubically starting the night before leaving the hospital.  Both are itchy/painful and red but no pus.  Has nystatin powder and has used it twice with no improvement.  Also has canker sores in his mouth and back of throat that are somewhat painful and make it difficult to swallow.  Also complains of diarrhea 2x/day for the past 1-2 days.  Review of Systems Per HPI; otherwise negative  SH reviewed - pt states he has not smoked in 2 weeks and has not used cocaine at all, though hospitalization UDS positive for cocaine; admits to smoking marijuana.    Objective:   Physical Exam BP 112/75  Pulse 80  Temp 98.2 F (36.8 C) (Oral)  Wt 186 lb 4.8 oz (84.505 kg)  SpO2 97% Gen: NAD, with [place] and oxygen tank, appearing tired and older than stated age CV: RRR PULM: CTAB but  with decreased breath sounds bilateral LL ABD: soft, nt, nd, NABS Skin: right armpit with maculopapular erythematous rash and pubic symphysis with similar-appearing rash; no exudate HEENT: o/p with moderate amt of thrush Neuro: CN 2-12 tested and in tact; 5/5 strength bilateral UE    Assessment:     45yo with h/o asthma/COPD (current smoker), and tobacco/alcohol/other drug abuse discharged yesterday with diagnosis of bilateral pneumonia/pneumonitis, here for hospital f/u and to receive 1 of 3 more doses of antibiotic therapy.     Plan:

## 2012-05-20 NOTE — Discharge Summary (Signed)
Family Medicine Teaching Service  Discharge Note : Attending Jeff Adama Ferber MD Pager [phone] Inpatient Team Pager:  [phone]  I have seen and examined this patient, reviewed their chart and discussed discharge planning wit the resident at the time of discharge. I agree with the discharge plan as above.  

## 2012-05-20 NOTE — Telephone Encounter (Signed)
Spoke to Mr. Saetern's wife on the phone this morning. Explained to her that I spoke with the other doctors on the inpt service about his not being able to afford medications. Instructed her that she or he can call the clinic to set up nursing appointments for today (7/30), tomorrow (7/31), and Wednesday (8/1) to get 3 doses each of ceftriaxone 1g IM and azithromycin 500 mg PO. Also instructed wife to ask if he could be rescheduled for closer follow-up than 8/13. Wife stated she understands and has no other questions.

## 2012-05-20 NOTE — Assessment & Plan Note (Addendum)
Pt here for hospital f/u of bilateral pneumonia and pneumonitis.  O2 sat on 1L O2 is 97% and pt appears stable in clinic. Some decompensation yesterday likely due to decompensation of disease v drug use v panic attack. - Receiving first of three more doses of antibiotic in clinic today because could not afford levaquin prescription (ceftriaxone 1g IM and azithromycin 500 mg PO, both x 3 days) - Return to clinic tomorrow and the following day for next two doses. - Strongly encouraged to fill prednisone prescription. - Continue using albuterol as needed for next few days until reassess - F/u on Fri or Mon for f/u appt with provider - Continue using O2 at home, increasing as needed. - Instructed to refrain from smoking and inhaling drugs or other pollutants to prevent further irritating lungs. - Instructed to never be around open flame with O2 tank. - Gave info for [employer] program for medication assitance

## 2012-05-20 NOTE — Assessment & Plan Note (Signed)
Pt has oral candidiasis on exam, likely secondary to prednisone treatment. - nystatin oral suspension to swish/swallow prescribed though not a priority in setting of inability to afford medications for respiratory symptoms

## 2012-05-21 ENCOUNTER — Ambulatory Visit (INDEPENDENT_AMBULATORY_CARE_PROVIDER_SITE_OTHER): Payer: 59 | Admitting: *Deleted

## 2012-05-21 ENCOUNTER — Encounter: Payer: Self-pay | Admitting: Family Medicine

## 2012-05-21 DIAGNOSIS — J189 Pneumonia, unspecified organism: Secondary | ICD-10-CM

## 2012-05-21 LAB — CULTURE, BLOOD (ROUTINE X 2): Culture: NO GROWTH

## 2012-05-21 MED ORDER — CEFTRIAXONE SODIUM 1 G IJ SOLR: 1.0000 g | INTRAMUSCULAR | Status: AC

## 2012-05-21 MED ORDER — AZITHROMYCIN 250 MG PO TABS: 500.0000 mg | ORAL_TABLET | ORAL | Status: DC

## 2012-05-21 MED ORDER — AZITHROMYCIN 500 MG PO TABS: 500.0000 mg | ORAL_TABLET | Freq: Every day | ORAL | Status: DC

## 2012-05-21 MED ORDER — CEFTRIAXONE SODIUM 1 G IJ SOLR
1.0000 g | INTRAMUSCULAR | Status: DC
  Administered 2012-05-20 – 2012-05-21 (×2): 1 g via INTRAMUSCULAR

## 2012-05-21 MED ORDER — AZITHROMYCIN 500 MG PO TABS
500.0000 mg | ORAL_TABLET | Freq: Once | ORAL | Status: DC
  Administered 2012-05-20: 500 mg via ORAL

## 2012-05-21 MED ORDER — AZITHROMYCIN 500 MG PO TABS
500.0000 mg | ORAL_TABLET | Freq: Every day | ORAL | Status: DC
  Administered 2012-05-21: 500 mg via ORAL

## 2012-05-21 NOTE — Addendum Note (Signed)
Addended by: Salomon Mast on: 05/21/2012 09:19 AM   Modules accepted: Orders

## 2012-05-21 NOTE — Addendum Note (Signed)
Addended by: Salomon Mast on: 05/21/2012 09:07 AM   Modules accepted: Orders

## 2012-05-21 NOTE — Progress Notes (Signed)
Patient waited in office 20 minutes following injection without complications.

## 2012-05-22 ENCOUNTER — Ambulatory Visit (INDEPENDENT_AMBULATORY_CARE_PROVIDER_SITE_OTHER): Payer: Self-pay | Admitting: *Deleted

## 2012-05-22 DIAGNOSIS — J189 Pneumonia, unspecified organism: Secondary | ICD-10-CM

## 2012-05-22 MED ORDER — AZITHROMYCIN 500 MG PO TABS
500.0000 mg | ORAL_TABLET | Freq: Once | ORAL | Status: AC
  Administered 2012-05-22: 500 mg via ORAL

## 2012-05-22 MED ORDER — CEFTRIAXONE SODIUM 1 G IJ SOLR
1.0000 g | Freq: Once | INTRAMUSCULAR | Status: AC
  Administered 2012-05-22: 1 g via INTRAMUSCULAR

## 2012-05-23 ENCOUNTER — Ambulatory Visit (INDEPENDENT_AMBULATORY_CARE_PROVIDER_SITE_OTHER): Payer: 59 | Admitting: Family Medicine

## 2012-05-23 ENCOUNTER — Encounter: Payer: Self-pay | Admitting: Family Medicine

## 2012-05-23 VITALS — BP 135/87 | HR 86 | Temp 98.7°F | Wt 174.0 lb

## 2012-05-23 DIAGNOSIS — B370 Candidal stomatitis: Secondary | ICD-10-CM

## 2012-05-23 DIAGNOSIS — B354 Tinea corporis: Secondary | ICD-10-CM

## 2012-05-23 DIAGNOSIS — J189 Pneumonia, unspecified organism: Secondary | ICD-10-CM

## 2012-05-23 MED ORDER — FLUCONAZOLE 100 MG PO TABS: ORAL_TABLET | ORAL | Status: DC

## 2012-05-23 NOTE — Assessment & Plan Note (Addendum)
Will treat with oral fluconazole as this will also cover the Tinea. HIV negative in hospital about a week ago.

## 2012-05-23 NOTE — Progress Notes (Signed)
  Subjective:    Patient ID: David Benson, male    DOB: [birth year], 46 y.o.   MRN: [id number]  HPI  David Benson comes in for follow up of his PNA.  He has had to come to the office for treatment with IM antibiotics because he could not afford the medications at the pharmacy.  He has had three doses of CTX IM and azithro 500 mg PO in our office, the last one yesterday.  He says that he is feeling about 50% better than he was 4 days ago.  He is still wearing oxygen, and feels he needs it when he gets up and walks around.  He has not had fevers or rigors in >24 hours.  He still feels very fatigued.  He says he quit smoking all together when he got sick about a week and a half ago.    He asks questions about his lung function from the emphysema, as someone in the hospital told him part of his lung was "dead" on an imaging test.    He also has oral thrush and tinia on his arm and suprapubic, but has not gotten the medication filled for this.  Past Medical History  Diagnosis Date  . Pneumonia   . Asthma    History  Substance Use Topics  . Smoking status: Former Smoker -- 0.5 packs/day for 10 years    Quit date: 05/12/2012  . Smokeless tobacco: Not on file  . Alcohol Use: 1.2 oz/week    2 Cans of beer per week      Review of Systems See HPI    Objective:   Physical Exam BP 135/87  Pulse 86  Temp 98.7 F (37.1 C) (Oral)  Wt 174 lb (78.926 kg)  SpO2 98% on 1.5L, 96% on RA General appearance: alert, cooperative and no distress Mouth: Oral thrush:  Neck: no adenopathy, no JVD, supple, symmetrical, trachea midline and thyroid not enlarged, symmetric, no tenderness/mass/nodules Lungs: Decreased breath sounds of right LL, but no crackles, otherwise lungs are clear Abdomen: soft, non-tender; bowel sounds normal; no masses,  no organomegaly Extremities: extremities normal, atraumatic, no cyanosis or edema Pulses: 2+ and symmetric Skin: circular lesion on thigh, suprapubic area and on left  inner arm consistent with tinea.       Assessment & Plan:

## 2012-05-23 NOTE — Assessment & Plan Note (Signed)
Will teat with oral fluconazole since he has 3 lesions + oral candida. Stressed importance of taking this medication

## 2012-05-23 NOTE — Patient Instructions (Signed)
It was good to see you.  Your oxygen saturation was 96% on room air, so your lungs are doing much better.  Please continue the oxygen as needed.  Please come back for an appointment in 1-2 weeks to make sure you are feeling better.    In a month or two, when you are recovered, you should have spirometry (Lung function test) done.  Please remind [place] to schedule you to see Dr. Raymondo Band for this.

## 2012-05-23 NOTE — Assessment & Plan Note (Signed)
S/P tx for CAP, with both clinical and symptomatic improvement. Encouraged to continue medications for COPD, and follow up in 1-2 weeks.  I also recommended he have spirometry done when he is completely recovered from the PNA.  Reassured that it may take several weeks to feel like himself again, and he should continue to take it easy.

## 2012-06-03 ENCOUNTER — Ambulatory Visit: Payer: 59 | Admitting: Family Medicine

## 2012-06-06 ENCOUNTER — Ambulatory Visit: Payer: 59 | Admitting: Family Medicine

## 2012-06-10 ENCOUNTER — Ambulatory Visit (INDEPENDENT_AMBULATORY_CARE_PROVIDER_SITE_OTHER): Payer: 59 | Admitting: Family Medicine

## 2012-06-10 ENCOUNTER — Encounter: Payer: Self-pay | Admitting: Family Medicine

## 2012-06-10 VITALS — BP 133/93 | HR 81 | Temp 98.6°F | Ht 69.0 in | Wt 170.0 lb

## 2012-06-10 DIAGNOSIS — L301 Dyshidrosis [pompholyx]: Secondary | ICD-10-CM

## 2012-06-10 DIAGNOSIS — J189 Pneumonia, unspecified organism: Secondary | ICD-10-CM

## 2012-06-10 DIAGNOSIS — F17200 Nicotine dependence, unspecified, uncomplicated: Secondary | ICD-10-CM

## 2012-06-10 DIAGNOSIS — M1990 Unspecified osteoarthritis, unspecified site: Secondary | ICD-10-CM

## 2012-06-10 DIAGNOSIS — Z720 Tobacco use: Secondary | ICD-10-CM | POA: Insufficient documentation

## 2012-06-10 MED ORDER — TRAMADOL HCL 50 MG PO TABS: 50.0000 mg | ORAL_TABLET | Freq: Every day | ORAL | Status: AC

## 2012-06-10 MED ORDER — NICOTINE 7 MG/24HR TD PT24: 1.0000 | MEDICATED_PATCH | TRANSDERMAL | Status: AC

## 2012-06-10 MED ORDER — TRIAMCINOLONE ACETONIDE 0.1 % EX CREA: TOPICAL_CREAM | Freq: Two times a day (BID) | CUTANEOUS | Status: DC

## 2012-06-10 MED ORDER — NICOTINE 14 MG/24HR TD PT24: 1.0000 | MEDICATED_PATCH | TRANSDERMAL | Status: AC

## 2012-06-10 NOTE — Patient Instructions (Signed)
Mr. Thorpe,  Thank you for coming in today,  1. Back pain: try tramadol at night and do strengthening exercises during the day.  2. For hand rash: use the kenalog ointment twice daily and keep hand dry (use gloves when washing dishes) 3. Smoking cessation: congratulations on quitting. We will start patch to keep you on track. 14 mg daily for 6 weeks, then 7 mg daily for 2 weeks. Smoking cessation support: smoking cessation hotline: 1-800-QUIT-NOW.  Here is the number to the smoking cessation classes at [facility]: 244-0102 4. PFTs: schedule PFTS with Dr. Raymondo Band for early next month. 5. F/u with your Dr. Benjamin Stain after PFTs.   Dr. Armen Pickup   Back Exercises Back exercises help treat and prevent back injuries. The goal of back exercises is to increase the strength of your abdominal and back muscles and the flexibility of your back. These exercises should be started when you no longer have back pain. Back exercises include:  Pelvic Tilt. Lie on your back with your knees bent. Tilt your pelvis until the lower part of your back is against the floor. Hold this position 5 to 10 sec and repeat 5 to 10 times.   Knee to Chest. Pull first 1 knee up against your chest and hold for 20 to 30 seconds, repeat this with the other knee, and then both knees. This may be done with the other leg straight or bent, whichever feels better.   Sit-Ups or Curl-Ups. Bend your knees 90 degrees. Start with tilting your pelvis, and do a partial, slow sit-up, lifting your trunk only 30 to 45 degrees off the floor. Take at least 2 to 3 seconds for each sit-up. Do not do sit-ups with your knees out straight. If partial sit-ups are difficult, simply do the above but with only tightening your abdominal muscles and holding it as directed.   Hip-Lift. Lie on your back with your knees flexed 90 degrees. Push down with your feet and shoulders as you raise your hips a couple inches off the floor; hold for 10 seconds, repeat 5 to 10  times.   Back arches. Lie on your stomach, propping yourself up on bent elbows. Slowly press on your hands, causing an arch in your low back. Repeat 3 to 5 times. Any initial stiffness and discomfort should lessen with repetition over time.   Shoulder-Lifts. Lie face down with arms beside your body. Keep hips and torso pressed to floor as you slowly lift your head and shoulders off the floor.  Do not overdo your exercises, especially in the beginning. Exercises may cause you some mild back discomfort which lasts for a few minutes; however, if the pain is more severe, or lasts for more than 15 minutes, do not continue exercises until you see your caregiver. Improvement with exercise therapy for back problems is slow.  See your caregivers for assistance with developing a proper back exercise program. Document Released: 11/15/2004 Document Revised: 09/27/2011 Document Reviewed: 10/08/2005 [facility] Patient Information 2012 [place], [place].

## 2012-06-10 NOTE — Progress Notes (Signed)
Subjective:     Patient ID: David Benson, male   DOB: Nov 06, 1965, 46 y.o.   MRN: [id number]  HPI 46 yo M presents to f/u to discuss the following: 1. Recent pneumonia: improved. Feeling much better. Still on O2 1.5-2.5 L. Admits to intermittent non productive cough. Denies fever.  2. Smoking cessation: has not smoked in 3 weeks. Admits to craving. Smoker 1/5 PPD of [place] x 20-25 years.   3. Back pain: chronic low back. Previously treated with narcotic. Tried motrin 800 mg and tylenol 650 mg per dose w/o relief. Pain interfering with sleep. No fever, weight loss, incontinence or groin numbness. Pain non-radiating.   4. Rash R hand: itchy, scaly. Denies working with harsh chemicals. Admits to doing dishes frequently.   Review of Systems As per HPI    Objective:   Physical Exam BP 133/93  Pulse 81  Temp 98.6 F (37 C) (Oral)  Ht 5\' 9"  (1.753 m)  Wt 170 lb (77.111 kg)  BMI 25.10 kg/m2  SpO2 100% General appearance: alert, cooperative, no distress and oxygen in place Back: symmetric, no curvature. ROM normal. No CVA tenderness. Lungs: clear to auscultation bilaterally Heart: regular rate and rhythm, S1, S2 normal, no murmur, click, rub or gallop Skin: dry scaly skin on volar suface of R hand and lateral aspects of digits  Assessment and Plan:

## 2012-06-10 NOTE — Assessment & Plan Note (Signed)
A: resolved P: PFTS next month.

## 2012-06-11 DIAGNOSIS — L301 Dyshidrosis [pompholyx]: Secondary | ICD-10-CM | POA: Insufficient documentation

## 2012-06-11 NOTE — Assessment & Plan Note (Signed)
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 Patient with unilateral kidney per report.  P:  -avoid NSAIDs -tramadol prn severe pain -tylenol prn moderate pain -back exercises for core strength.

## 2012-06-11 NOTE — Assessment & Plan Note (Signed)
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 Instructed patient to keep hands dry.

## 2012-06-11 NOTE — Assessment & Plan Note (Signed)
A: patient has quit. Having cravings.  P:  Nicoderm patch Smoking cessation resources per AVS

## 2013-06-01 ENCOUNTER — Emergency Department (INDEPENDENT_AMBULATORY_CARE_PROVIDER_SITE_OTHER)
Admission: EM | Admit: 2013-06-01 | Discharge: 2013-06-01 | Disposition: A | Discharge status: Home / Self Care | Payer: Self-pay | Source: Home / Self Care

## 2013-06-01 ENCOUNTER — Encounter (HOSPITAL_COMMUNITY): Payer: Self-pay | Admitting: Emergency Medicine

## 2013-06-01 ENCOUNTER — Other Ambulatory Visit (HOSPITAL_COMMUNITY)
Admission: RE | Admit: 2013-06-01 | Discharge: 2013-06-01 | Disposition: A | Discharge status: Home / Self Care | Payer: Self-pay | Source: Ambulatory Visit | Attending: Family Medicine | Admitting: Family Medicine

## 2013-06-01 DIAGNOSIS — Z113 Encounter for screening for infections with a predominantly sexual mode of transmission: Secondary | ICD-10-CM | POA: Insufficient documentation

## 2013-06-01 DIAGNOSIS — R369 Urethral discharge, unspecified: Secondary | ICD-10-CM

## 2013-06-01 MED ORDER — CEFTRIAXONE SODIUM 250 MG IJ SOLR
250.0000 mg | Freq: Once | INTRAMUSCULAR | Status: AC
  Administered 2013-06-01: 250 mg via INTRAMUSCULAR

## 2013-06-01 MED ORDER — CEFTRIAXONE SODIUM 250 MG IJ SOLR
INTRAMUSCULAR | Status: AC
  Filled 2013-06-01: qty 250

## 2013-06-01 MED ORDER — LIDOCAINE HCL (PF) 1 % IJ SOLN
INTRAMUSCULAR | Status: AC
  Filled 2013-06-01: qty 5

## 2013-06-01 MED ORDER — AZITHROMYCIN 250 MG PO TABS
1000.0000 mg | ORAL_TABLET | Freq: Once | ORAL | Status: AC
  Administered 2013-06-01: 1000 mg via ORAL

## 2013-06-01 MED ORDER — AZITHROMYCIN 250 MG PO TABS
ORAL_TABLET | ORAL | Status: AC
  Filled 2013-06-01: qty 4

## 2013-06-01 NOTE — ED Provider Notes (Signed)
David Benson is a 47 y.o. male who presents to Urgent Care today for penile discharge and pain present since this morning. Patient is concerned that he may have caught a sexually transmitted infection. He denies any abdominal pain nausea vomiting or diarrhea or fever. He feels well otherwise.    PMH reviewed. History of gonorrhea and Chlamydia in the past has been well treated History  Substance Use Topics  . Smoking status: Former Smoker -- 0.50 packs/day for 10 years    Quit date: 05/12/2012  . Smokeless tobacco: Not on file  . Alcohol Use: 1.2 oz/week    2 Cans of beer per week   ROS as above Medications reviewed. No current facility-administered medications for this encounter.   Current Outpatient Prescriptions  Medication Sig Dispense Refill  . albuterol (PROVENTIL HFA;VENTOLIN HFA) 108 (90 BASE) MCG/ACT inhaler Inhale 2 puffs into the lungs every 6 (six) hours as needed for wheezing.  1 Inhaler  0    Exam:  BP 135/84  Pulse 93  Temp(Src) 98.3 F (36.8 C) (Oral)  Resp 16  SpO2 100% Gen: Well NAD Genitals: Normal-appearing no inguinal lymphadenopathy. Testicles nontender.  Penis with small amount of discharge. Nontender   No results found for this or any previous visit (from the past 24 hour(s)). No results found.  Assessment and Plan: 47 y.o. male with penile discharge following sexually transmitted infection exposure. Plan to treat empirically with ceftriaxone and azithromycin. Urine cytology for gonorrhea, Chlamydia, trichomonas. RPR and HIV antibodies pending.  Will call patient with results.     Rodolph Bong, MD 06/01/13 [phone]

## 2013-06-01 NOTE — ED Notes (Signed)
C/o STD  Patient states he does have a discharge.  Patient states he does have pain.

## 2013-06-03 ENCOUNTER — Telehealth (HOSPITAL_COMMUNITY): Payer: Self-pay | Admitting: *Deleted

## 2013-06-03 NOTE — ED Notes (Signed)
GC pos., Chlamydia and Trich neg.  Pt. adequately treated with Rocephin and Zithromax.  I called pt. and left a message for pt. to call.  DHHS form completed and faxed to the [facility] Department. Vassie Moselle 06/03/2013

## 2013-06-06 ENCOUNTER — Telehealth (HOSPITAL_COMMUNITY): Payer: Self-pay | Admitting: *Deleted

## 2013-06-07 ENCOUNTER — Telehealth (HOSPITAL_COMMUNITY): Payer: Self-pay | Admitting: *Deleted

## 2013-06-07 NOTE — ED Notes (Signed)
I called pt. Pt. verified x 2 and given results.  Pt. told he was adequately treated with Rocephin and Zithromax. Pt. instructed to notify his partner, no sex for 1 week and to practice safe sex. Pt. told he should get HIV rechecked in 6 mos. at the [facility] Dept. STD clinic, by appointment.  He said his partner came with him here and was treated with the shot and the pills.  David Benson 06/07/2013

## 2013-06-10 ENCOUNTER — Encounter (HOSPITAL_COMMUNITY): Payer: Self-pay | Admitting: Emergency Medicine

## 2013-06-10 ENCOUNTER — Emergency Department (INDEPENDENT_AMBULATORY_CARE_PROVIDER_SITE_OTHER)
Admission: EM | Admit: 2013-06-10 | Discharge: 2013-06-10 | Disposition: A | Discharge status: Home / Self Care | Payer: Self-pay | Source: Home / Self Care | Attending: Family Medicine | Admitting: Family Medicine

## 2013-06-10 DIAGNOSIS — H109 Unspecified conjunctivitis: Secondary | ICD-10-CM

## 2013-06-10 MED ORDER — ERYTHROMYCIN 5 MG/GM OP OINT: TOPICAL_OINTMENT | OPHTHALMIC | Status: DC

## 2013-06-10 MED ORDER — KETOTIFEN FUMARATE 0.025 % OP SOLN: 2.0000 [drp] | Freq: Two times a day (BID) | OPHTHALMIC | Status: DC
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
## 2013-06-10 NOTE — ED Notes (Signed)
Pt c/o poss bilateral pink eye onset Sunday Sxs include: pain, irritation, redness, watery eyes, blurry vision (L=20/20; R=20/20) Denies: inj/trauma, fevers... Has tried OTC eye drops and warm compression w/no relief. Alert w/no signs of acute distress.

## 2014-08-20 ENCOUNTER — Emergency Department (HOSPITAL_COMMUNITY): Payer: Self-pay

## 2014-08-20 ENCOUNTER — Encounter (HOSPITAL_COMMUNITY): Payer: Self-pay | Admitting: Emergency Medicine

## 2014-08-20 ENCOUNTER — Emergency Department (HOSPITAL_COMMUNITY)
Admission: EM | Admit: 2014-08-20 | Discharge: 2014-08-20 | Disposition: A | Discharge status: Home / Self Care | Payer: Self-pay | Attending: Emergency Medicine | Admitting: Emergency Medicine

## 2014-08-20 DIAGNOSIS — Z791 Long term (current) use of non-steroidal anti-inflammatories (NSAID): Secondary | ICD-10-CM | POA: Insufficient documentation

## 2014-08-20 DIAGNOSIS — Z8701 Personal history of pneumonia (recurrent): Secondary | ICD-10-CM | POA: Insufficient documentation

## 2014-08-20 DIAGNOSIS — Z79899 Other long term (current) drug therapy: Secondary | ICD-10-CM | POA: Insufficient documentation

## 2014-08-20 DIAGNOSIS — J45909 Unspecified asthma, uncomplicated: Secondary | ICD-10-CM | POA: Insufficient documentation

## 2014-08-20 DIAGNOSIS — Z720 Tobacco use: Secondary | ICD-10-CM | POA: Insufficient documentation

## 2014-08-20 DIAGNOSIS — M79671 Pain in right foot: Secondary | ICD-10-CM | POA: Insufficient documentation

## 2014-08-20 DIAGNOSIS — R52 Pain, unspecified: Secondary | ICD-10-CM

## 2014-08-20 MED ORDER — IBUPROFEN 800 MG PO TABS: 800.0000 mg | ORAL_TABLET | Freq: Three times a day (TID) | ORAL | Status: DC

## 2014-08-20 MED ORDER — HYDROCODONE-ACETAMINOPHEN 5-325 MG PO TABS
2.0000 | ORAL_TABLET | Freq: Once | ORAL | Status: AC
  Administered 2014-08-20: 2 via ORAL
  Filled 2014-08-20: qty 2

## 2014-08-20 MED ORDER — HYDROCODONE-ACETAMINOPHEN 5-325 MG PO TABS: 2.0000 | ORAL_TABLET | ORAL | Status: DC | PRN
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
## 2014-08-20 NOTE — ED Notes (Signed)
Per pt reports right foot pain for a couple of days. Pt reports "think he broke it a few years ago." Pt denies recent injury. Upon assessment knot seen on top of foot; pt states has been there for a few years. Pedal pulses moderate and present bilaterally.

## 2014-08-20 NOTE — Discharge Instructions (Signed)
Foot Sprain The muscles and cord like structures which attach muscle to bone (tendons) that surround the feet are made up of units. A foot sprain can occur at the weakest spot in any of these units. This condition is most often caused by injury to or overuse of the foot, as from playing contact sports, or aggravating a previous injury, or from poor conditioning, or obesity. SYMPTOMS  Pain with movement of the foot.  Tenderness and swelling at the injury site.  Loss of strength is present in moderate or severe sprains. THE THREE GRADES OR SEVERITY OF FOOT SPRAIN ARE:  Mild (Grade I): Slightly pulled muscle without tearing of muscle or tendon fibers or loss of strength.  Moderate (Grade II): Tearing of fibers in a muscle, tendon, or at the attachment to bone, with small decrease in strength.  Severe (Grade III): Rupture of the muscle-tendon-bone attachment, with separation of fibers. Severe sprain requires surgical repair. Often repeating (chronic) sprains are caused by overuse. Sudden (acute) sprains are caused by direct injury or over-use. DIAGNOSIS  Diagnosis of this condition is usually by your own observation. If problems continue, a caregiver may be required for further evaluation and treatment. X-rays may be required to make sure there are not breaks in the bones (fractures) present. Continued problems may require physical therapy for treatment. PREVENTION  Use strength and conditioning exercises appropriate for your sport.  Warm up properly prior to working out.  Use athletic shoes that are made for the sport you are participating in.  Allow adequate time for healing. Early return to activities makes repeat injury more likely, and can lead to an unstable arthritic foot that can result in prolonged disability. Mild sprains generally heal in 3 to 10 days, with moderate and severe sprains taking 2 to 10 weeks. Your caregiver can help you determine the proper time required for  healing. HOME CARE INSTRUCTIONS   Apply ice to the injury for 15-20 minutes, 03-04 times per day. Put the ice in a plastic bag and place a towel between the bag of ice and your skin.  An elastic wrap (like an Ace bandage) may be used to keep swelling down.  Keep foot above the level of the heart, or at least raised on a footstool, when swelling and pain are present.  Try to avoid use other than gentle range of motion while the foot is painful. Do not resume use until instructed by your caregiver. Then begin use gradually, not increasing use to the point of pain. If pain does develop, decrease use and continue the above measures, gradually increasing activities that do not cause discomfort, until you gradually achieve normal use.  Use crutches if and as instructed, and for the length of time instructed.  Keep injured foot and ankle wrapped between treatments.  Massage foot and ankle for comfort and to keep swelling down. Massage from the toes up towards the knee.  Only take over-the-counter or prescription medicines for pain, discomfort, or fever as directed by your caregiver. SEEK IMMEDIATE MEDICAL CARE IF:   Your pain and swelling increase, or pain is not controlled with medications.  You have loss of feeling in your foot or your foot turns cold or blue.  You develop new, unexplained symptoms, or an increase of the symptoms that brought you to your caregiver. MAKE SURE YOU:   Understand these instructions.  Will watch your condition.  Will get help right away if you are not doing well or get worse. Document Released:   03/30/2002 Document Revised: 12/31/2011 Document Reviewed: 05/27/2008 Exit[employer] Patient Information 2015 [employer]. This information is not intended to replace advice given to you by your health care provider. Make sure you discuss any questions you have with your health care provider.  

## 2014-08-21 NOTE — ED Provider Notes (Signed)
Medical screening examination/treatment/procedure(s) were performed by non-physician practitioner and as supervising physician I was immediately available for consultation/collaboration.   EKG Interpretation None        Lomax Poehler, MD 08/21/14 1335 

## 2014-09-16 ENCOUNTER — Encounter (HOSPITAL_COMMUNITY): Payer: Self-pay | Admitting: Emergency Medicine

## 2014-09-16 ENCOUNTER — Emergency Department (HOSPITAL_COMMUNITY): Payer: Self-pay

## 2014-09-16 ENCOUNTER — Emergency Department (HOSPITAL_COMMUNITY)
Admission: EM | Admit: 2014-09-16 | Discharge: 2014-09-16 | Disposition: A | Discharge status: Home / Self Care | Payer: Self-pay | Attending: Emergency Medicine | Admitting: Emergency Medicine

## 2014-09-16 DIAGNOSIS — F1410 Cocaine abuse, uncomplicated: Secondary | ICD-10-CM | POA: Insufficient documentation

## 2014-09-16 DIAGNOSIS — IMO0002 Reserved for concepts with insufficient information to code with codable children: Secondary | ICD-10-CM

## 2014-09-16 DIAGNOSIS — Z791 Long term (current) use of non-steroidal anti-inflammatories (NSAID): Secondary | ICD-10-CM | POA: Insufficient documentation

## 2014-09-16 DIAGNOSIS — Z720 Tobacco use: Secondary | ICD-10-CM | POA: Insufficient documentation

## 2014-09-16 DIAGNOSIS — R451 Restlessness and agitation: Secondary | ICD-10-CM

## 2014-09-16 DIAGNOSIS — Z79899 Other long term (current) drug therapy: Secondary | ICD-10-CM | POA: Insufficient documentation

## 2014-09-16 DIAGNOSIS — R4189 Other symptoms and signs involving cognitive functions and awareness: Secondary | ICD-10-CM

## 2014-09-16 DIAGNOSIS — F10120 Alcohol abuse with intoxication, uncomplicated: Secondary | ICD-10-CM | POA: Insufficient documentation

## 2014-09-16 DIAGNOSIS — J45909 Unspecified asthma, uncomplicated: Secondary | ICD-10-CM | POA: Insufficient documentation

## 2014-09-16 DIAGNOSIS — Z8701 Personal history of pneumonia (recurrent): Secondary | ICD-10-CM | POA: Insufficient documentation

## 2014-09-16 DIAGNOSIS — R55 Syncope and collapse: Secondary | ICD-10-CM | POA: Insufficient documentation

## 2014-09-16 LAB — COMPREHENSIVE METABOLIC PANEL
ALK PHOS: 57 U/L (ref 39–117)
ALT: 31 U/L (ref 0–53)
AST: 40 U/L — AB (ref 0–37)
Albumin: 3.3 g/dL — ABNORMAL LOW (ref 3.5–5.2)
Anion gap: 16 — ABNORMAL HIGH (ref 5–15)
BUN: 10 mg/dL (ref 6–23)
CALCIUM: 8.8 mg/dL (ref 8.4–10.5)
CO2: 24 meq/L (ref 19–32)
Chloride: 97 mEq/L (ref 96–112)
Creatinine, Ser: 1.27 mg/dL (ref 0.50–1.35)
GFR calc Af Amer: 76 mL/min — ABNORMAL LOW (ref >90)
GFR, EST NON AFRICAN AMERICAN: 66 mL/min — AB (ref >90)
Glucose, Bld: 121 mg/dL — ABNORMAL HIGH (ref 70–99)
POTASSIUM: 4.1 meq/L (ref 3.7–5.3)
SODIUM: 137 meq/L (ref 137–147)
TOTAL PROTEIN: 6.1 g/dL (ref 6.0–8.3)
Total Bilirubin: <0.2 mg/dL — ABNORMAL LOW (ref 0.3–1.2)

## 2014-09-16 LAB — CBC
HCT: 44.2 % (ref 39.0–52.0)
Hemoglobin: 14.7 g/dL (ref 13.0–17.0)
MCH: 33.2 pg (ref 26.0–34.0)
MCHC: 33.3 g/dL (ref 30.0–36.0)
MCV: 99.8 fL (ref 78.0–100.0)
PLATELETS: 217 10*3/uL (ref 150–400)
RBC: 4.43 MIL/uL (ref 4.22–5.81)
RDW: 14.7 % (ref 11.5–15.5)
WBC: 4.6 10*3/uL (ref 4.0–10.5)

## 2014-09-16 LAB — ETHANOL: Alcohol, Ethyl (B): 226 mg/dL — ABNORMAL HIGH (ref 0–11)

## 2014-09-16 LAB — RAPID URINE DRUG SCREEN, HOSP PERFORMED
AMPHETAMINES: NOT DETECTED
BENZODIAZEPINES: NOT DETECTED
Barbiturates: NOT DETECTED
Cocaine: POSITIVE — AB
OPIATES: NOT DETECTED
Tetrahydrocannabinol: NOT DETECTED

## 2014-09-16 LAB — PROTIME-INR
INR: 0.94 (ref 0.00–1.49)
Prothrombin Time: 12.7 seconds (ref 11.6–15.2)

## 2014-09-16 MED ORDER — STERILE WATER FOR INJECTION IJ SOLN
INTRAMUSCULAR | Status: AC
  Administered 2014-09-16: 01:00:00
  Filled 2014-09-16: qty 10

## 2014-09-16 MED ORDER — ZIPRASIDONE MESYLATE 20 MG IM SOLR
INTRAMUSCULAR | Status: AC
  Filled 2014-09-16: qty 20

## 2014-09-16 MED ORDER — DIPHENHYDRAMINE HCL 50 MG/ML IJ SOLN: 25.0000 mg | Freq: Once | INTRAMUSCULAR | Status: DC

## 2014-09-16 MED ORDER — ZIPRASIDONE MESYLATE 20 MG IM SOLR: 10.0000 mg | Freq: Once | INTRAMUSCULAR | Status: DC

## 2014-09-16 MED ORDER — ZIPRASIDONE MESYLATE 20 MG IM SOLR
10.0000 mg | Freq: Once | INTRAMUSCULAR | Status: AC
  Administered 2014-09-16: 10 mg via INTRAMUSCULAR

## 2014-09-16 NOTE — ED Notes (Signed)
Bed: RESB Expected date:  Expected time:  Means of arrival:  Comments: EMS 48 yo male/unresponsive and vomiting combative

## 2014-09-16 NOTE — ED Notes (Addendum)
Pt is being verbally and physically aggressive upon arrival.  Pt also removing oxygen causing himself to desat into the 70's.
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
## 2014-09-16 NOTE — Discharge Instructions (Signed)
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
## 2014-09-20 LAB — I-STAT TROPONIN, ED: Troponin i, poc: 0 ng/mL (ref 0.00–0.08)

## 2014-12-29 ENCOUNTER — Emergency Department (HOSPITAL_COMMUNITY)
Admission: EM | Admit: 2014-12-29 | Discharge: 2014-12-29 | Disposition: A | Discharge status: Home / Self Care | Payer: Self-pay | Attending: Emergency Medicine | Admitting: Emergency Medicine

## 2014-12-29 ENCOUNTER — Encounter (HOSPITAL_COMMUNITY): Payer: Self-pay | Admitting: Emergency Medicine

## 2014-12-29 ENCOUNTER — Emergency Department (HOSPITAL_COMMUNITY): Payer: Self-pay

## 2014-12-29 DIAGNOSIS — R55 Syncope and collapse: Secondary | ICD-10-CM | POA: Insufficient documentation

## 2014-12-29 DIAGNOSIS — Z720 Tobacco use: Secondary | ICD-10-CM | POA: Insufficient documentation

## 2014-12-29 DIAGNOSIS — J45909 Unspecified asthma, uncomplicated: Secondary | ICD-10-CM | POA: Insufficient documentation

## 2014-12-29 DIAGNOSIS — Z79899 Other long term (current) drug therapy: Secondary | ICD-10-CM | POA: Insufficient documentation

## 2014-12-29 DIAGNOSIS — R748 Abnormal levels of other serum enzymes: Secondary | ICD-10-CM | POA: Insufficient documentation

## 2014-12-29 DIAGNOSIS — Z791 Long term (current) use of non-steroidal anti-inflammatories (NSAID): Secondary | ICD-10-CM | POA: Insufficient documentation

## 2014-12-29 DIAGNOSIS — R42 Dizziness and giddiness: Secondary | ICD-10-CM | POA: Insufficient documentation

## 2014-12-29 DIAGNOSIS — Z8701 Personal history of pneumonia (recurrent): Secondary | ICD-10-CM | POA: Insufficient documentation

## 2014-12-29 LAB — BASIC METABOLIC PANEL
ANION GAP: 8 (ref 5–15)
BUN: 8 mg/dL (ref 6–23)
CALCIUM: 8.3 mg/dL — AB (ref 8.4–10.5)
CO2: 24 mmol/L (ref 19–32)
CREATININE: 1.11 mg/dL (ref 0.50–1.35)
Chloride: 107 mmol/L (ref 96–112)
GFR calc Af Amer: 90 mL/min — ABNORMAL LOW (ref >90)
GFR calc non Af Amer: 77 mL/min — ABNORMAL LOW (ref >90)
Glucose, Bld: 88 mg/dL (ref 70–99)
POTASSIUM: 4 mmol/L (ref 3.5–5.1)
Sodium: 139 mmol/L (ref 135–145)

## 2014-12-29 LAB — I-STAT TROPONIN, ED: Troponin i, poc: 0 ng/mL (ref 0.00–0.08)

## 2014-12-29 LAB — CBC
HEMATOCRIT: 45.1 % (ref 39.0–52.0)
Hemoglobin: 15.3 g/dL (ref 13.0–17.0)
MCH: 33.5 pg (ref 26.0–34.0)
MCHC: 33.9 g/dL (ref 30.0–36.0)
MCV: 98.7 fL (ref 78.0–100.0)
Platelets: 175 10*3/uL (ref 150–400)
RBC: 4.57 MIL/uL (ref 4.22–5.81)
RDW: 14.1 % (ref 11.5–15.5)
WBC: 3.9 10*3/uL — AB (ref 4.0–10.5)

## 2014-12-29 LAB — CBG MONITORING, ED: Glucose-Capillary: 83 mg/dL (ref 70–99)

## 2014-12-29 LAB — CK: Total CK: 330 U/L — ABNORMAL HIGH (ref 7–232)

## 2014-12-29 MED ORDER — SODIUM CHLORIDE 0.9 % IV BOLUS (SEPSIS)
1000.0000 mL | Freq: Once | INTRAVENOUS | Status: AC
  Administered 2014-12-29: 1000 mL via INTRAVENOUS

## 2014-12-29 NOTE — Discharge Instructions (Signed)
As discussed, your evaluation here is largely reassuring aside from elevation of your creatinine kinase level.  It is important that she stay well-hydrated, and be sure to follow-up with our primary care team for further evaluation and management.  Please be sure to stay well-hydrated.  Be sure to return here for any concerning changes in your condition.
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
## 2014-12-29 NOTE — ED Notes (Signed)
Pt reports increased shortness of breath, like "my asthma is acting up." Lung sounds clear; sats 97%

## 2014-12-29 NOTE — ED Notes (Signed)
Per [facility], donated plasma roughly 1.5 hours ago. Goes twice a week. They took 825ccs. Was on his way back home in car, suddenly had a hot flash, became diaphoretic, near sycopal episode. Unsure of LOC. Pt states he felt "weak all over". No significal orthostatic vitals. Slight SOB, hx of COPD, 100% spo2 on Room air. Lung sounds clear. Pt started having some numbness and tingling in right hand. EKG unremarkable, shows RBBB, unsure if  Hx or not. Pt has had similar syncopol episodes like this before. Upon arrival to EMS, pt AAOX4, denies CP or pressure. Pt given 324 mg asa by ems.

## 2014-12-29 NOTE — ED Notes (Signed)
Pt waited until fluids finished before discharge

## 2015-04-24 ENCOUNTER — Emergency Department (HOSPITAL_COMMUNITY)
Admission: EM | Admit: 2015-04-24 | Discharge: 2015-04-25 | Disposition: A | Discharge status: Home / Self Care | Payer: Self-pay | Attending: Emergency Medicine | Admitting: Emergency Medicine

## 2015-04-24 ENCOUNTER — Emergency Department (HOSPITAL_COMMUNITY): Payer: Self-pay

## 2015-04-24 ENCOUNTER — Encounter (HOSPITAL_COMMUNITY): Payer: Self-pay

## 2015-04-24 DIAGNOSIS — Z9889 Other specified postprocedural states: Secondary | ICD-10-CM | POA: Insufficient documentation

## 2015-04-24 DIAGNOSIS — R109 Unspecified abdominal pain: Secondary | ICD-10-CM

## 2015-04-24 DIAGNOSIS — Z8701 Personal history of pneumonia (recurrent): Secondary | ICD-10-CM | POA: Insufficient documentation

## 2015-04-24 DIAGNOSIS — R630 Anorexia: Secondary | ICD-10-CM | POA: Insufficient documentation

## 2015-04-24 DIAGNOSIS — J45909 Unspecified asthma, uncomplicated: Secondary | ICD-10-CM | POA: Insufficient documentation

## 2015-04-24 DIAGNOSIS — Z720 Tobacco use: Secondary | ICD-10-CM | POA: Insufficient documentation

## 2015-04-24 DIAGNOSIS — R1110 Vomiting, unspecified: Secondary | ICD-10-CM

## 2015-04-24 DIAGNOSIS — K2970 Gastritis, unspecified, without bleeding: Secondary | ICD-10-CM | POA: Insufficient documentation

## 2015-04-24 LAB — COMPREHENSIVE METABOLIC PANEL
ALBUMIN: 3.5 g/dL (ref 3.5–5.0)
ALT: 26 U/L (ref 17–63)
AST: 36 U/L (ref 15–41)
Alkaline Phosphatase: 41 U/L (ref 38–126)
Anion gap: 9 (ref 5–15)
BILIRUBIN TOTAL: 0.5 mg/dL (ref 0.3–1.2)
BUN: 15 mg/dL (ref 6–20)
CO2: 29 mmol/L (ref 22–32)
Calcium: 8.6 mg/dL — ABNORMAL LOW (ref 8.9–10.3)
Chloride: 98 mmol/L — ABNORMAL LOW (ref 101–111)
Creatinine, Ser: 1.2 mg/dL (ref 0.61–1.24)
Glucose, Bld: 98 mg/dL (ref 65–99)
Potassium: 4.6 mmol/L (ref 3.5–5.1)
SODIUM: 136 mmol/L (ref 135–145)
Total Protein: 5.9 g/dL — ABNORMAL LOW (ref 6.5–8.1)

## 2015-04-24 LAB — LIPASE, BLOOD: Lipase: 27 U/L (ref 22–51)

## 2015-04-24 LAB — CBC WITH DIFFERENTIAL/PLATELET
BASOS ABS: 0 10*3/uL (ref 0.0–0.1)
BASOS PCT: 0 % (ref 0–1)
Eosinophils Absolute: 0.1 10*3/uL (ref 0.0–0.7)
Eosinophils Relative: 1 % (ref 0–5)
HEMATOCRIT: 47.6 % (ref 39.0–52.0)
Hemoglobin: 16.2 g/dL (ref 13.0–17.0)
Lymphocytes Relative: 31 % (ref 12–46)
Lymphs Abs: 2 10*3/uL (ref 0.7–4.0)
MCH: 33.9 pg (ref 26.0–34.0)
MCHC: 34 g/dL (ref 30.0–36.0)
MCV: 99.6 fL (ref 78.0–100.0)
MONOS PCT: 6 % (ref 3–12)
Monocytes Absolute: 0.4 10*3/uL (ref 0.1–1.0)
NEUTROS ABS: 4 10*3/uL (ref 1.7–7.7)
NEUTROS PCT: 62 % (ref 43–77)
Platelets: 204 10*3/uL (ref 150–400)
RBC: 4.78 MIL/uL (ref 4.22–5.81)
RDW: 12.5 % (ref 11.5–15.5)
WBC: 6.5 10*3/uL (ref 4.0–10.5)

## 2015-04-24 LAB — I-STAT CG4 LACTIC ACID, ED: LACTIC ACID, VENOUS: 2.25 mmol/L — AB (ref 0.5–2.0)

## 2015-04-24 MED ORDER — HYDROMORPHONE HCL 1 MG/ML IJ SOLN
1.0000 mg | Freq: Once | INTRAMUSCULAR | Status: AC
  Administered 2015-04-24: 1 mg via INTRAVENOUS
  Filled 2015-04-24: qty 1

## 2015-04-24 MED ORDER — GI COCKTAIL ~~LOC~~
30.0000 mL | Freq: Once | ORAL | Status: AC
  Administered 2015-04-24: 30 mL via ORAL
  Filled 2015-04-24: qty 30

## 2015-04-24 MED ORDER — ALBUTEROL SULFATE HFA 108 (90 BASE) MCG/ACT IN AERS
2.0000 | INHALATION_SPRAY | RESPIRATORY_TRACT | Status: DC | PRN
  Administered 2015-04-24: 2 via RESPIRATORY_TRACT
  Filled 2015-04-24: qty 6.7

## 2015-04-24 MED ORDER — SODIUM CHLORIDE 0.9 % IV BOLUS (SEPSIS)
1000.0000 mL | Freq: Once | INTRAVENOUS | Status: AC
  Administered 2015-04-24: 1000 mL via INTRAVENOUS

## 2015-04-24 MED ORDER — FAMOTIDINE IN NACL 20-0.9 MG/50ML-% IV SOLN
20.0000 mg | Freq: Once | INTRAVENOUS | Status: AC
  Administered 2015-04-24: 20 mg via INTRAVENOUS
  Filled 2015-04-24: qty 50

## 2015-04-24 MED ORDER — ONDANSETRON HCL 4 MG/2ML IJ SOLN
4.0000 mg | Freq: Once | INTRAMUSCULAR | Status: AC
  Administered 2015-04-24: 4 mg via INTRAVENOUS
  Filled 2015-04-24: qty 2
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
## 2015-04-24 NOTE — ED Notes (Signed)
Pt was given sprite soda for fluid challenge---- tolerating well at this time, no nausea but states, "my stomach is still sore".

## 2015-04-24 NOTE — ED Notes (Signed)
Pt presents with epigastric pain that started approx an hour and a half ago. Pt reports he has been vomiting all day as well. Pt denies any diarrhea.
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
## 2015-04-25 ENCOUNTER — Encounter (HOSPITAL_COMMUNITY): Payer: Self-pay

## 2015-04-25 ENCOUNTER — Emergency Department (HOSPITAL_COMMUNITY): Payer: Self-pay

## 2015-04-25 MED ORDER — OXYCODONE-ACETAMINOPHEN 5-325 MG PO TABS: 1.0000 | ORAL_TABLET | Freq: Four times a day (QID) | ORAL | Status: DC | PRN

## 2015-04-25 MED ORDER — OXYCODONE-ACETAMINOPHEN 5-325 MG PO TABS
1.0000 | ORAL_TABLET | Freq: Once | ORAL | Status: AC
  Administered 2015-04-25: 1 via ORAL
  Filled 2015-04-25: qty 1

## 2015-04-25 MED ORDER — IOHEXOL 300 MG/ML  SOLN
100.0000 mL | Freq: Once | INTRAMUSCULAR | Status: AC | PRN
  Administered 2015-04-25: 100 mL via INTRAVENOUS

## 2015-04-25 MED ORDER — OMEPRAZOLE 20 MG PO CPDR: DELAYED_RELEASE_CAPSULE | ORAL | Status: DC

## 2015-04-25 MED ORDER — ONDANSETRON 4 MG PO TBDP: 4.0000 mg | ORAL_TABLET | Freq: Three times a day (TID) | ORAL | Status: DC | PRN

## 2015-04-25 MED ORDER — IOHEXOL 300 MG/ML  SOLN
25.0000 mL | Freq: Once | INTRAMUSCULAR | Status: AC | PRN
  Administered 2015-04-25: 25 mL via ORAL

## 2015-06-03 ENCOUNTER — Encounter (HOSPITAL_COMMUNITY): Payer: Self-pay

## 2015-06-03 ENCOUNTER — Emergency Department (HOSPITAL_COMMUNITY): Payer: Self-pay

## 2015-06-03 ENCOUNTER — Emergency Department (HOSPITAL_COMMUNITY)
Admission: EM | Admit: 2015-06-03 | Discharge: 2015-06-03 | Disposition: A | Discharge status: Home / Self Care | Payer: Self-pay | Attending: Emergency Medicine | Admitting: Emergency Medicine

## 2015-06-03 DIAGNOSIS — Y9389 Activity, other specified: Secondary | ICD-10-CM | POA: Insufficient documentation

## 2015-06-03 DIAGNOSIS — M549 Dorsalgia, unspecified: Secondary | ICD-10-CM

## 2015-06-03 DIAGNOSIS — W19XXXA Unspecified fall, initial encounter: Secondary | ICD-10-CM

## 2015-06-03 DIAGNOSIS — Z8701 Personal history of pneumonia (recurrent): Secondary | ICD-10-CM | POA: Insufficient documentation

## 2015-06-03 DIAGNOSIS — Z720 Tobacco use: Secondary | ICD-10-CM | POA: Insufficient documentation

## 2015-06-03 DIAGNOSIS — S80811A Abrasion, right lower leg, initial encounter: Secondary | ICD-10-CM | POA: Insufficient documentation

## 2015-06-03 DIAGNOSIS — Y998 Other external cause status: Secondary | ICD-10-CM | POA: Insufficient documentation

## 2015-06-03 DIAGNOSIS — J449 Chronic obstructive pulmonary disease, unspecified: Secondary | ICD-10-CM | POA: Insufficient documentation

## 2015-06-03 DIAGNOSIS — M25512 Pain in left shoulder: Secondary | ICD-10-CM

## 2015-06-03 DIAGNOSIS — Y9289 Other specified places as the place of occurrence of the external cause: Secondary | ICD-10-CM | POA: Insufficient documentation

## 2015-06-03 DIAGNOSIS — S4991XA Unspecified injury of right shoulder and upper arm, initial encounter: Secondary | ICD-10-CM | POA: Insufficient documentation

## 2015-06-03 DIAGNOSIS — Z23 Encounter for immunization: Secondary | ICD-10-CM | POA: Insufficient documentation

## 2015-06-03 DIAGNOSIS — Z79899 Other long term (current) drug therapy: Secondary | ICD-10-CM | POA: Insufficient documentation

## 2015-06-03 DIAGNOSIS — S3992XA Unspecified injury of lower back, initial encounter: Secondary | ICD-10-CM | POA: Insufficient documentation

## 2015-06-03 DIAGNOSIS — T148XXA Other injury of unspecified body region, initial encounter: Secondary | ICD-10-CM

## 2015-06-03 DIAGNOSIS — M62838 Other muscle spasm: Secondary | ICD-10-CM

## 2015-06-03 DIAGNOSIS — M1990 Unspecified osteoarthritis, unspecified site: Secondary | ICD-10-CM | POA: Insufficient documentation

## 2015-06-03 DIAGNOSIS — M62830 Muscle spasm of back: Secondary | ICD-10-CM | POA: Insufficient documentation

## 2015-06-03 DIAGNOSIS — W1839XA Other fall on same level, initial encounter: Secondary | ICD-10-CM | POA: Insufficient documentation

## 2015-06-03 HISTORY — DX: Unspecified osteoarthritis, unspecified site: M19.90

## 2015-06-03 HISTORY — DX: Chronic obstructive pulmonary disease, unspecified: J44.9

## 2015-06-03 MED ORDER — CYCLOBENZAPRINE HCL 10 MG PO TABS: 10.0000 mg | ORAL_TABLET | Freq: Three times a day (TID) | ORAL | Status: DC | PRN

## 2015-06-03 MED ORDER — HYDROCODONE-ACETAMINOPHEN 5-325 MG PO TABS
1.0000 | ORAL_TABLET | Freq: Once | ORAL | Status: AC
  Administered 2015-06-03: 1 via ORAL
  Filled 2015-06-03: qty 1

## 2015-06-03 MED ORDER — NAPROXEN 500 MG PO TABS: 500.0000 mg | ORAL_TABLET | Freq: Two times a day (BID) | ORAL | Status: DC | PRN

## 2015-06-03 MED ORDER — TETANUS-DIPHTH-ACELL PERTUSSIS 5-2.5-18.5 LF-MCG/0.5 IM SUSP
0.5000 mL | Freq: Once | INTRAMUSCULAR | Status: AC
  Administered 2015-06-03: 0.5 mL via INTRAMUSCULAR
  Filled 2015-06-03: qty 0.5

## 2015-06-03 MED ORDER — HYDROCODONE-ACETAMINOPHEN 5-325 MG PO TABS: 1.0000 | ORAL_TABLET | Freq: Four times a day (QID) | ORAL | Status: DC | PRN

## 2015-06-03 NOTE — ED Notes (Signed)
Patient states his right foot went through the ceiling in the house. Patient states that he twisted and caught himself. Patient denies hitting his head. Patient c/o left shoulder, right leg, upper back pain

## 2015-06-03 NOTE — Discharge Instructions (Signed)
Take naprosyn as directed for inflammation and pain with norco for breakthrough pain and flexeril for muscle relaxation. Do not drive or operate machinery with pain medication or muscle relaxation use. Ice to areas of soreness for the next 24 hours and then may move to heat, no more than 20 minutes at a time every hour for each. Expect to be sore for the next few days and follow up with primary care physician for recheck of ongoing symptoms in the next 1-2 weeks. Keep your abrasion covered with topical antibiotic and a bandage until it heals. Return to ER for emergent changing or worsening of symptoms.     Back Pain, Adult Back pain is very common. The pain often gets better over time. The cause of back pain is usually not dangerous. Most people can learn to manage their back pain on their own.  HOME CARE   Stay active. Start with short walks on flat ground if you can. Try to walk farther each day.  Do not sit, drive, or stand in one place for more than 30 minutes. Do not stay in bed.  Do not avoid exercise or work. Activity can help your back heal faster.  Be careful when you bend or lift an object. Bend at your knees, keep the object close to you, and do not twist.  Sleep on a firm mattress. Lie on your side, and bend your knees. If you lie on your back, put a pillow under your knees.  Only take medicines as told by your doctor.  Put ice on the injured area.  Put ice in a plastic bag.  Place a towel between your skin and the bag.  Leave the ice on for 15-20 minutes, 03-04 times a day for the first 2 to 3 days. After that, you can switch between ice and heat packs.  Ask your doctor about back exercises or massage.  Avoid feeling anxious or stressed. Find good ways to deal with stress, such as exercise. GET HELP RIGHT AWAY IF:   Your pain does not go away with rest or medicine.  Your pain does not go away in 1 week.  You have new problems.  You do not feel well.  The pain  spreads into your legs.  You cannot control when you poop (bowel movement) or pee (urinate).  Your arms or legs feel weak or lose feeling (numbness).  You feel sick to your stomach (nauseous) or throw up (vomit).  You have belly (abdominal) pain.  You feel like you may pass out (faint). MAKE SURE YOU:   Understand these instructions.  Will watch your condition.  Will get help right away if you are not doing well or get worse. Document Released: 03/26/2008 Document Revised: 12/31/2011 Document Reviewed: 02/09/2014 [facility] Patient Information 2015 [place], [place]. This information is not intended to replace advice given to you by your health care provider. Make sure you discuss any questions you have with your health care provider.  Musculoskeletal Pain Musculoskeletal pain is muscle and boney aches and pains. These pains can occur in any part of the body. Your caregiver may treat you without knowing the cause of the pain. They may treat you if blood or urine tests, X-rays, and other tests were normal.  CAUSES There is often not a definite cause or reason for these pains. These pains may be caused by a type of germ (virus). The discomfort may also come from overuse. Overuse includes working out too hard when your body  is not fit. Boney aches also come from weather changes. Bone is sensitive to atmospheric pressure changes. HOME CARE INSTRUCTIONS   Ask when your test results will be ready. Make sure you get your test results.  Only take over-the-counter or prescription medicines for pain, discomfort, or fever as directed by your caregiver. If you were given medications for your condition, do not drive, operate machinery or power tools, or sign legal documents for 24 hours. Do not drink alcohol. Do not take sleeping pills or other medications that may interfere with treatment.  Continue all activities unless the activities cause more pain. When the pain lessens, slowly resume normal  activities. Gradually increase the intensity and duration of the activities or exercise.  During periods of severe pain, bed rest may be helpful. Lay or sit in any position that is comfortable.  Putting ice on the injured area.  Put ice in a bag.  Place a towel between your skin and the bag.  Leave the ice on for 15 to 20 minutes, 3 to 4 times a day.  Follow up with your caregiver for continued problems and no reason can be found for the pain. If the pain becomes worse or does not go away, it may be necessary to repeat tests or do additional testing. Your caregiver may need to look further for a possible cause. SEEK IMMEDIATE MEDICAL CARE IF:  You have pain that is getting worse and is not relieved by medications.  You develop chest pain that is associated with shortness or breath, sweating, feeling sick to your stomach (nauseous), or throw up (vomit).  Your pain becomes localized to the abdomen.  You develop any new symptoms that seem different or that concern you. MAKE SURE YOU:   Understand these instructions.  Will watch your condition.  Will get help right away if you are not doing well or get worse. Document Released: 10/08/2005 Document Revised: 12/31/2011 Document Reviewed: 06/12/2013 [facility] Patient Information 2015 [place], [place]. This information is not intended to replace advice given to you by your health care provider. Make sure you discuss any questions you have with your health care provider.  Muscle Cramps and Spasms Muscle cramps and spasms are when muscles tighten by themselves. They usually get better within minutes. Muscle cramps are painful. They are usually stronger and last longer than muscle spasms. Muscle spasms may or may not be painful. They can last a few seconds or much longer. HOME CARE  Drink enough fluid to keep your pee (urine) clear or pale yellow.  Massage, stretch, and relax the muscle.  Use a warm towel, heating pad, or warm shower water  on tight muscles.  Place ice on the muscle if it is tender or in pain.  Put ice in a plastic bag.  Place a towel between your skin and the bag.  Leave the ice on for 15-20 minutes, 03-04 times a day.  Only take medicine as told by your doctor. GET HELP RIGHT AWAY IF:  Your cramps or spasms get worse, happen more often, or do not get better with time. MAKE SURE YOU:  Understand these instructions.  Will watch your condition.  Will get help right away if you are not doing well or get worse. Document Released: 09/20/2008 Document Revised: 02/02/2013 Document Reviewed: 09/24/2012 [facility] Patient Information 2015 [place], [place]. This information is not intended to replace advice given to you by your health care provider. Make sure you discuss any questions you have with your health care  provider.  Heat Therapy Heat therapy can help ease sore, stiff, injured, and tight muscles and joints. Heat relaxes your muscles, which may help ease your pain.  RISKS AND COMPLICATIONS If you have any of the following conditions, do not use heat therapy unless your health care provider has approved:  Poor circulation.  Healing wounds or scarred skin in the area being treated.  Diabetes, heart disease, or high blood pressure.  Not being able to feel (numbness) the area being treated.  Unusual swelling of the area being treated.  Active infections.  Blood clots.  Cancer.  Inability to communicate pain. This may include young children and people who have problems with their brain function (dementia).  Pregnancy. Heat therapy should only be used on old, pre-existing, or long-lasting (chronic) injuries. Do not use heat therapy on new injuries unless directed by your health care provider. HOW TO USE HEAT THERAPY There are several different kinds of heat therapy, including:  Moist heat pack.  Warm water bath.  Hot water bottle.  Electric heating pad.  Heated gel pack.  Heated  wrap.  Electric heating pad. Use the heat therapy method suggested by your health care provider. Follow your health care provider's instructions on when and how to use heat therapy. GENERAL HEAT THERAPY RECOMMENDATIONS  Do not sleep while using heat therapy. Only use heat therapy while you are awake.  Your skin may turn pink while using heat therapy. Do not use heat therapy if your skin turns red.  Do not use heat therapy if you have new pain.  High heat or long exposure to heat can cause burns. Be careful when using heat therapy to avoid burning your skin.  Do not use heat therapy on areas of your skin that are already irritated, such as with a rash or sunburn. SEEK MEDICAL CARE IF:  You have blisters, redness, swelling, or numbness.  You have new pain.  Your pain is worse. MAKE SURE YOU:  Understand these instructions.  Will watch your condition.  Will get help right away if you are not doing well or get worse. Document Released: 12/31/2011 Document Revised: 02/22/2014 Document Reviewed: 12/01/2013 [facility] Patient Information 2015 [place], [place]. This information is not intended to replace advice given to you by your health care provider. Make sure you discuss any questions you have with your health care provider.
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
## 2015-07-30 ENCOUNTER — Emergency Department (HOSPITAL_COMMUNITY): Payer: Self-pay

## 2015-07-30 ENCOUNTER — Emergency Department (HOSPITAL_COMMUNITY)
Admission: EM | Admit: 2015-07-30 | Discharge: 2015-07-30 | Disposition: A | Discharge status: Home / Self Care | Payer: Self-pay | Attending: Emergency Medicine | Admitting: Emergency Medicine

## 2015-07-30 ENCOUNTER — Encounter (HOSPITAL_COMMUNITY): Payer: Self-pay | Admitting: *Deleted

## 2015-07-30 DIAGNOSIS — Y998 Other external cause status: Secondary | ICD-10-CM | POA: Insufficient documentation

## 2015-07-30 DIAGNOSIS — S060X1A Concussion with loss of consciousness of 30 minutes or less, initial encounter: Secondary | ICD-10-CM | POA: Insufficient documentation

## 2015-07-30 DIAGNOSIS — Y9289 Other specified places as the place of occurrence of the external cause: Secondary | ICD-10-CM | POA: Insufficient documentation

## 2015-07-30 DIAGNOSIS — Y9389 Activity, other specified: Secondary | ICD-10-CM | POA: Insufficient documentation

## 2015-07-30 DIAGNOSIS — S3992XA Unspecified injury of lower back, initial encounter: Secondary | ICD-10-CM | POA: Insufficient documentation

## 2015-07-30 DIAGNOSIS — M1990 Unspecified osteoarthritis, unspecified site: Secondary | ICD-10-CM | POA: Insufficient documentation

## 2015-07-30 DIAGNOSIS — R109 Unspecified abdominal pain: Secondary | ICD-10-CM

## 2015-07-30 DIAGNOSIS — S3991XA Unspecified injury of abdomen, initial encounter: Secondary | ICD-10-CM | POA: Insufficient documentation

## 2015-07-30 DIAGNOSIS — J449 Chronic obstructive pulmonary disease, unspecified: Secondary | ICD-10-CM | POA: Insufficient documentation

## 2015-07-30 DIAGNOSIS — S299XXA Unspecified injury of thorax, initial encounter: Secondary | ICD-10-CM | POA: Insufficient documentation

## 2015-07-30 DIAGNOSIS — Z720 Tobacco use: Secondary | ICD-10-CM | POA: Insufficient documentation

## 2015-07-30 LAB — CBC
HEMATOCRIT: 42.4 % (ref 39.0–52.0)
HEMOGLOBIN: 14.1 g/dL (ref 13.0–17.0)
MCH: 34 pg (ref 26.0–34.0)
MCHC: 33.3 g/dL (ref 30.0–36.0)
MCV: 102.2 fL — AB (ref 78.0–100.0)
Platelets: 224 10*3/uL (ref 150–400)
RBC: 4.15 MIL/uL — AB (ref 4.22–5.81)
RDW: 14.3 % (ref 11.5–15.5)
WBC: 5.3 10*3/uL (ref 4.0–10.5)

## 2015-07-30 LAB — I-STAT TROPONIN, ED: Troponin i, poc: 0.02 ng/mL (ref 0.00–0.08)

## 2015-07-30 LAB — BASIC METABOLIC PANEL
ANION GAP: 12 (ref 5–15)
BUN: 6 mg/dL (ref 6–20)
CO2: 24 mmol/L (ref 22–32)
Calcium: 9.1 mg/dL (ref 8.9–10.3)
Chloride: 102 mmol/L (ref 101–111)
Creatinine, Ser: 1.47 mg/dL — ABNORMAL HIGH (ref 0.61–1.24)
GFR calc Af Amer: >60 mL/min (ref >60)
GFR, EST NON AFRICAN AMERICAN: 55 mL/min — AB (ref >60)
Glucose, Bld: 70 mg/dL (ref 65–99)
POTASSIUM: 4.5 mmol/L (ref 3.5–5.1)
SODIUM: 138 mmol/L (ref 135–145)
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
## 2015-11-17 ENCOUNTER — Emergency Department (HOSPITAL_COMMUNITY): Payer: Self-pay

## 2015-11-17 ENCOUNTER — Emergency Department (HOSPITAL_COMMUNITY): Payer: MEDICAID

## 2015-11-17 ENCOUNTER — Inpatient Hospital Stay (HOSPITAL_COMMUNITY)
Admission: EM | Admit: 2015-11-17 | Discharge: 2015-12-03 | DRG: 157 | Disposition: A | Discharge status: Other Acute Inpatient Hospital | Payer: Self-pay | Attending: General Surgery | Admitting: General Surgery

## 2015-11-17 DIAGNOSIS — R001 Bradycardia, unspecified: Secondary | ICD-10-CM | POA: Diagnosis present

## 2015-11-17 DIAGNOSIS — Z4659 Encounter for fitting and adjustment of other gastrointestinal appliance and device: Secondary | ICD-10-CM

## 2015-11-17 DIAGNOSIS — S02609B Fracture of mandible, unspecified, initial encounter for open fracture: Secondary | ICD-10-CM | POA: Diagnosis present

## 2015-11-17 DIAGNOSIS — D539 Nutritional anemia, unspecified: Secondary | ICD-10-CM | POA: Insufficient documentation

## 2015-11-17 DIAGNOSIS — Z9289 Personal history of other medical treatment: Secondary | ICD-10-CM

## 2015-11-17 DIAGNOSIS — R569 Unspecified convulsions: Secondary | ICD-10-CM | POA: Insufficient documentation

## 2015-11-17 DIAGNOSIS — R1310 Dysphagia, unspecified: Secondary | ICD-10-CM | POA: Insufficient documentation

## 2015-11-17 DIAGNOSIS — S0452XA Injury of facial nerve, left side, initial encounter: Secondary | ICD-10-CM | POA: Diagnosis present

## 2015-11-17 DIAGNOSIS — S0292XA Unspecified fracture of facial bones, initial encounter for closed fracture: Secondary | ICD-10-CM | POA: Diagnosis present

## 2015-11-17 DIAGNOSIS — Z01818 Encounter for other preprocedural examination: Secondary | ICD-10-CM

## 2015-11-17 DIAGNOSIS — H538 Other visual disturbances: Secondary | ICD-10-CM | POA: Diagnosis present

## 2015-11-17 DIAGNOSIS — I959 Hypotension, unspecified: Secondary | ICD-10-CM | POA: Diagnosis present

## 2015-11-17 DIAGNOSIS — F10129 Alcohol abuse with intoxication, unspecified: Secondary | ICD-10-CM | POA: Diagnosis present

## 2015-11-17 DIAGNOSIS — S0240FB Zygomatic fracture, left side, initial encounter for open fracture: Secondary | ICD-10-CM | POA: Diagnosis present

## 2015-11-17 DIAGNOSIS — G40909 Epilepsy, unspecified, not intractable, without status epilepticus: Secondary | ICD-10-CM

## 2015-11-17 DIAGNOSIS — R21 Rash and other nonspecific skin eruption: Secondary | ICD-10-CM | POA: Diagnosis not present

## 2015-11-17 DIAGNOSIS — T426X5A Adverse effect of other antiepileptic and sedative-hypnotic drugs, initial encounter: Secondary | ICD-10-CM | POA: Diagnosis not present

## 2015-11-17 DIAGNOSIS — S025XXA Fracture of tooth (traumatic), initial encounter for closed fracture: Secondary | ICD-10-CM | POA: Diagnosis present

## 2015-11-17 DIAGNOSIS — Z880 Allergy status to penicillin: Secondary | ICD-10-CM

## 2015-11-17 DIAGNOSIS — R451 Restlessness and agitation: Secondary | ICD-10-CM | POA: Diagnosis not present

## 2015-11-17 DIAGNOSIS — R4182 Altered mental status, unspecified: Secondary | ICD-10-CM

## 2015-11-17 DIAGNOSIS — F1010 Alcohol abuse, uncomplicated: Secondary | ICD-10-CM | POA: Diagnosis present

## 2015-11-17 DIAGNOSIS — K089 Disorder of teeth and supporting structures, unspecified: Secondary | ICD-10-CM

## 2015-11-17 DIAGNOSIS — J9600 Acute respiratory failure, unspecified whether with hypoxia or hypercapnia: Secondary | ICD-10-CM | POA: Diagnosis present

## 2015-11-17 DIAGNOSIS — H5712 Ocular pain, left eye: Secondary | ICD-10-CM | POA: Diagnosis present

## 2015-11-17 DIAGNOSIS — G510 Bell's palsy: Secondary | ICD-10-CM | POA: Diagnosis present

## 2015-11-17 DIAGNOSIS — H5702 Anisocoria: Secondary | ICD-10-CM | POA: Diagnosis present

## 2015-11-17 DIAGNOSIS — Y907 Blood alcohol level of 200-239 mg/100 ml: Secondary | ICD-10-CM | POA: Diagnosis present

## 2015-11-17 DIAGNOSIS — Z79899 Other long term (current) drug therapy: Secondary | ICD-10-CM

## 2015-11-17 DIAGNOSIS — S0240EB Zygomatic fracture, right side, initial encounter for open fracture: Principal | ICD-10-CM | POA: Diagnosis present

## 2015-11-17 DIAGNOSIS — S02609A Fracture of mandible, unspecified, initial encounter for closed fracture: Secondary | ICD-10-CM

## 2015-11-17 DIAGNOSIS — S0240DB Maxillary fracture, left side, initial encounter for open fracture: Secondary | ICD-10-CM | POA: Diagnosis present

## 2015-11-17 DIAGNOSIS — K011 Impacted teeth: Secondary | ICD-10-CM | POA: Diagnosis present

## 2015-11-17 DIAGNOSIS — W3400XA Accidental discharge from unspecified firearms or gun, initial encounter: Secondary | ICD-10-CM

## 2015-11-17 DIAGNOSIS — G40409 Other generalized epilepsy and epileptic syndromes, not intractable, without status epilepticus: Secondary | ICD-10-CM | POA: Diagnosis present

## 2015-11-17 HISTORY — DX: Accidental discharge from unspecified firearms or gun, initial encounter: W34.00XA

## 2015-11-17 LAB — PROTIME-INR
INR: 0.97 (ref 0.00–1.49)
PROTHROMBIN TIME: 13.1 s (ref 11.6–15.2)

## 2015-11-17 LAB — ABO/RH: ABO/RH(D): O POS

## 2015-11-17 LAB — PREPARE FRESH FROZEN PLASMA
UNIT DIVISION: 0
Unit division: 0

## 2015-11-17 LAB — COMPREHENSIVE METABOLIC PANEL
ALBUMIN: 3.3 g/dL — AB (ref 3.5–5.0)
ALT: 17 U/L (ref 17–63)
ANION GAP: 12 (ref 5–15)
AST: 27 U/L (ref 15–41)
Alkaline Phosphatase: 50 U/L (ref 38–126)
BILIRUBIN TOTAL: 0.4 mg/dL (ref 0.3–1.2)
BUN: 9 mg/dL (ref 6–20)
CHLORIDE: 105 mmol/L (ref 101–111)
CO2: 24 mmol/L (ref 22–32)
Calcium: 9.1 mg/dL (ref 8.9–10.3)
Creatinine, Ser: 1.51 mg/dL — ABNORMAL HIGH (ref 0.61–1.24)
GFR calc Af Amer: >60 mL/min (ref >60)
GFR, EST NON AFRICAN AMERICAN: 53 mL/min — AB (ref >60)
GLUCOSE: 139 mg/dL — AB (ref 65–99)
POTASSIUM: 3.7 mmol/L (ref 3.5–5.1)
Sodium: 141 mmol/L (ref 135–145)
TOTAL PROTEIN: 5.5 g/dL — AB (ref 6.5–8.1)

## 2015-11-17 LAB — I-STAT ARTERIAL BLOOD GAS, ED
Acid-base deficit: 3 mmol/L — ABNORMAL HIGH (ref 0.0–2.0)
BICARBONATE: 22.9 meq/L (ref 20.0–24.0)
O2 Saturation: 100 %
PCO2 ART: 41.1 mmHg (ref 35.0–45.0)
PH ART: 7.346 — AB (ref 7.350–7.450)
PO2 ART: 210 mmHg — AB (ref 80.0–100.0)
TCO2: 24 mmol/L (ref 0–100)

## 2015-11-17 LAB — ETHANOL: Alcohol, Ethyl (B): 201 mg/dL — ABNORMAL HIGH (ref <5)

## 2015-11-17 LAB — TYPE AND SCREEN
ABO/RH(D): O POS
ANTIBODY SCREEN: NEGATIVE
UNIT DIVISION: 0
Unit division: 0

## 2015-11-17 LAB — CBC
HEMATOCRIT: 44 % (ref 39.0–52.0)
HEMOGLOBIN: 14.6 g/dL (ref 13.0–17.0)
MCH: 33.9 pg (ref 26.0–34.0)
MCHC: 33.2 g/dL (ref 30.0–36.0)
MCV: 102.1 fL — AB (ref 78.0–100.0)
Platelets: 222 10*3/uL (ref 150–400)
RBC: 4.31 MIL/uL (ref 4.22–5.81)
RDW: 13.5 % (ref 11.5–15.5)
WBC: 5.2 10*3/uL (ref 4.0–10.5)

## 2015-11-17 LAB — TRIGLYCERIDES: Triglycerides: 83 mg/dL (ref <150)

## 2015-11-17 LAB — CDS SEROLOGY

## 2015-11-17 MED ORDER — ONDANSETRON HCL 4 MG PO TABS
4.0000 mg | ORAL_TABLET | Freq: Four times a day (QID) | ORAL | Status: DC | PRN
  Administered 2015-11-27 – 2015-11-30 (×2): 4 mg via ORAL
  Filled 2015-11-17 (×2): qty 1

## 2015-11-17 MED ORDER — ROCURONIUM BROMIDE 50 MG/5ML IV SOLN
INTRAVENOUS | Status: AC | PRN
  Administered 2015-11-17: 80 mg via INTRAVENOUS

## 2015-11-17 MED ORDER — PROPOFOL 1000 MG/100ML IV EMUL
INTRAVENOUS | Status: AC
  Filled 2015-11-17: qty 100

## 2015-11-17 MED ORDER — ANTISEPTIC ORAL RINSE SOLUTION (CORINZ)
7.0000 mL | Freq: Four times a day (QID) | OROMUCOSAL | Status: DC
Start: 2015-11-18 — End: 2015-11-19
  Administered 2015-11-18 – 2015-11-19 (×5): 7 mL via OROMUCOSAL

## 2015-11-17 MED ORDER — SODIUM CHLORIDE 0.9 % IV SOLN
INTRAVENOUS | Status: AC | PRN
  Administered 2015-11-17 (×2): 1000 mL via INTRAVENOUS

## 2015-11-17 MED ORDER — LORAZEPAM 2 MG/ML IJ SOLN
1.0000 mg | INTRAMUSCULAR | Status: DC | PRN
  Administered 2015-11-17 – 2015-11-18 (×2): 2 mg via INTRAVENOUS
  Filled 2015-11-17 (×2): qty 1

## 2015-11-17 MED ORDER — POTASSIUM CHLORIDE IN NACL 20-0.9 MEQ/L-% IV SOLN
INTRAVENOUS | Status: DC
  Administered 2015-11-17 – 2015-11-19 (×3): via INTRAVENOUS
  Administered 2015-11-20: 75 mL/h via INTRAVENOUS
  Administered 2015-11-20: 03:00:00 via INTRAVENOUS
  Administered 2015-11-21: 50 mL/h via INTRAVENOUS
  Administered 2015-11-22: 08:00:00 via INTRAVENOUS
  Filled 2015-11-17 (×12): qty 1000

## 2015-11-17 MED ORDER — PROPOFOL 1000 MG/100ML IV EMUL
5.0000 ug/kg/min | Freq: Once | INTRAVENOUS | Status: DC
  Administered 2015-11-17: 10 ug/kg/min via INTRAVENOUS

## 2015-11-17 MED ORDER — SODIUM CHLORIDE 0.9 % IV SOLN
500.0000 mg | Freq: Two times a day (BID) | INTRAVENOUS | Status: DC
  Administered 2015-11-17 – 2015-11-18 (×2): 500 mg via INTRAVENOUS
  Filled 2015-11-17 (×3): qty 5

## 2015-11-17 MED ORDER — FENTANYL CITRATE (PF) 100 MCG/2ML IJ SOLN
25.0000 ug | INTRAMUSCULAR | Status: DC | PRN
  Administered 2015-11-17 – 2015-11-18 (×3): 50 ug via INTRAVENOUS
  Filled 2015-11-17 (×3): qty 2

## 2015-11-17 MED ORDER — LORAZEPAM 2 MG/ML IJ SOLN
2.0000 mg | Freq: Once | INTRAMUSCULAR | Status: AC
  Administered 2015-11-17: 2 mg via INTRAVENOUS

## 2015-11-17 MED ORDER — PANTOPRAZOLE SODIUM 40 MG PO TBEC
40.0000 mg | DELAYED_RELEASE_TABLET | Freq: Every day | ORAL | Status: DC
Start: 2015-11-17 — End: 2015-11-18

## 2015-11-17 MED ORDER — MIDAZOLAM HCL 2 MG/2ML IJ SOLN
2.0000 mg | Freq: Once | INTRAMUSCULAR | Status: AC
  Administered 2015-11-17: 2 mg via INTRAVENOUS
  Filled 2015-11-17: qty 2

## 2015-11-17 MED ORDER — PANTOPRAZOLE SODIUM 40 MG IV SOLR
40.0000 mg | Freq: Every day | INTRAVENOUS | Status: DC
Start: 2015-11-17 — End: 2015-11-18
  Administered 2015-11-17: 40 mg via INTRAVENOUS
  Filled 2015-11-17 (×2): qty 40

## 2015-11-17 MED ORDER — OXYCODONE HCL 5 MG PO TABS
5.0000 mg | ORAL_TABLET | ORAL | Status: DC | PRN
  Administered 2015-11-18: 10 mg via ORAL
  Filled 2015-11-17: qty 2

## 2015-11-17 MED ORDER — FENTANYL CITRATE (PF) 100 MCG/2ML IJ SOLN
50.0000 ug | Freq: Once | INTRAMUSCULAR | Status: AC
  Administered 2015-11-17: 50 ug via INTRAVENOUS
  Filled 2015-11-17: qty 2

## 2015-11-17 MED ORDER — ETOMIDATE 2 MG/ML IV SOLN
INTRAVENOUS | Status: AC | PRN
  Administered 2015-11-17: 25 mg via INTRAVENOUS

## 2015-11-17 MED ORDER — LORAZEPAM 2 MG/ML IJ SOLN
INTRAMUSCULAR | Status: AC
  Filled 2015-11-17: qty 1

## 2015-11-17 MED ORDER — ONDANSETRON HCL 4 MG/2ML IJ SOLN
4.0000 mg | Freq: Four times a day (QID) | INTRAMUSCULAR | Status: DC | PRN
  Administered 2015-11-19: 4 mg via INTRAVENOUS
  Filled 2015-11-17: qty 2
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
## 2015-11-17 NOTE — ED Notes (Signed)
Pt intubated, positive color change, bilat breath sounds, 25 teeth.

## 2015-11-17 NOTE — ED Notes (Signed)
Pt began seizing again, md notified. Orders received.

## 2015-11-17 NOTE — ED Notes (Signed)
Pt transported to CT with this RN, Eme RN and Dr Janee Morn.
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
## 2015-11-17 NOTE — ED Notes (Signed)
Family at bedside. 

## 2015-11-17 NOTE — Procedures (Signed)
Extubation Procedure Note  Patient Details:   Name: Nell Schrack DOB: 06/02/1967 MRN: [id number]   Airway Documentation:     Evaluation  O2 sats: stable throughout Complications: No apparent complications Patient did tolerate procedure well. Bilateral Breath Sounds: Clear, Diminished Suctioning: Oral, Airway Yes   Patient was extubated to a 4L [place]. No stridor noted. Cuff leak was heard. RN and family was at bedside during extubation. RT will continue to monitor.  Darolyn Rua 11/17/2015, 6:49 PM
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
## 2015-11-17 NOTE — ED Notes (Signed)
Pt seizing again

## 2015-11-17 NOTE — ED Notes (Signed)
Pre paring to intubate pt

## 2015-11-18 LAB — CBC
HCT: 38.5 % — ABNORMAL LOW (ref 39.0–52.0)
Hemoglobin: 12.3 g/dL — ABNORMAL LOW (ref 13.0–17.0)
MCH: 33 pg (ref 26.0–34.0)
MCHC: 31.9 g/dL (ref 30.0–36.0)
MCV: 103.2 fL — ABNORMAL HIGH (ref 78.0–100.0)
Platelets: 201 K/uL (ref 150–400)
RBC: 3.73 MIL/uL — ABNORMAL LOW (ref 4.22–5.81)
RDW: 13.8 % (ref 11.5–15.5)
WBC: 11.4 K/uL — ABNORMAL HIGH (ref 4.0–10.5)

## 2015-11-18 LAB — BASIC METABOLIC PANEL WITH GFR
Anion gap: 5 (ref 5–15)
BUN: 8 mg/dL (ref 6–20)
CO2: 27 mmol/L (ref 22–32)
Calcium: 8.3 mg/dL — ABNORMAL LOW (ref 8.9–10.3)
Chloride: 108 mmol/L (ref 101–111)
Creatinine, Ser: 1.28 mg/dL — ABNORMAL HIGH (ref 0.61–1.24)
GFR calc Af Amer: >60 mL/min
GFR calc non Af Amer: >60 mL/min
Glucose, Bld: 112 mg/dL — ABNORMAL HIGH (ref 65–99)
Potassium: 4.5 mmol/L (ref 3.5–5.1)
Sodium: 140 mmol/L (ref 135–145)

## 2015-11-18 LAB — BLOOD PRODUCT ORDER (VERBAL) VERIFICATION

## 2015-11-18 MED ORDER — POLYETHYLENE GLYCOL 3350 17 G PO PACK
17.0000 g | PACK | Freq: Every day | ORAL | Status: DC
  Administered 2015-11-19 – 2015-12-03 (×12): 17 g via ORAL
  Filled 2015-11-18 (×14): qty 1

## 2015-11-18 MED ORDER — SODIUM CHLORIDE 0.9 % IV SOLN
1000.0000 mg | Freq: Once | INTRAVENOUS | Status: AC
  Administered 2015-11-18: 1000 mg via INTRAVENOUS
  Filled 2015-11-18: qty 10

## 2015-11-18 MED ORDER — DOCUSATE SODIUM 100 MG PO CAPS
100.0000 mg | ORAL_CAPSULE | Freq: Two times a day (BID) | ORAL | Status: DC
  Administered 2015-11-18 – 2015-12-03 (×30): 100 mg via ORAL
  Filled 2015-11-18 (×32): qty 1

## 2015-11-18 MED ORDER — ENOXAPARIN SODIUM 40 MG/0.4ML ~~LOC~~ SOLN
40.0000 mg | SUBCUTANEOUS | Status: DC
  Administered 2015-11-18 – 2015-12-03 (×16): 40 mg via SUBCUTANEOUS
  Filled 2015-11-18 (×16): qty 0.4

## 2015-11-18 MED ORDER — INFLUENZA VAC SPLIT QUAD 0.5 ML IM SUSY
0.5000 mL | PREFILLED_SYRINGE | INTRAMUSCULAR | Status: AC
Start: 2015-11-19 — End: 2015-11-20
  Administered 2015-11-20: 0.5 mL via INTRAMUSCULAR
  Filled 2015-11-18: qty 0.5

## 2015-11-18 MED ORDER — MORPHINE SULFATE (PF) 2 MG/ML IV SOLN
2.0000 mg | INTRAVENOUS | Status: DC | PRN
  Administered 2015-11-19 – 2015-11-30 (×31): 2 mg via INTRAVENOUS
  Filled 2015-11-18 (×32): qty 1

## 2015-11-18 MED ORDER — LEVETIRACETAM 500 MG PO TABS
500.0000 mg | ORAL_TABLET | Freq: Two times a day (BID) | ORAL | Status: DC
  Administered 2015-11-18 – 2015-11-20 (×4): 500 mg via ORAL
  Filled 2015-11-18 (×6): qty 1

## 2015-11-18 MED ORDER — NAPROXEN 250 MG PO TABS
500.0000 mg | ORAL_TABLET | Freq: Two times a day (BID) | ORAL | Status: DC
  Administered 2015-11-18 – 2015-11-19 (×2): 500 mg via ORAL
  Filled 2015-11-18 (×2): qty 1
  Filled 2015-11-18 (×3): qty 2

## 2015-11-18 MED ORDER — OXYCODONE HCL 5 MG PO TABS
5.0000 mg | ORAL_TABLET | ORAL | Status: DC | PRN
  Administered 2015-11-18: 15 mg via ORAL
  Administered 2015-11-18: 10 mg via ORAL
  Administered 2015-11-19 (×2): 15 mg via ORAL
  Filled 2015-11-18: qty 3
  Filled 2015-11-18: qty 2
  Filled 2015-11-18 (×2): qty 3

## 2015-11-18 MED ORDER — MORPHINE SULFATE (PF) 2 MG/ML IV SOLN
2.0000 mg | INTRAVENOUS | Status: DC | PRN
  Administered 2015-11-18 (×3): 2 mg via INTRAVENOUS
  Filled 2015-11-18 (×3): qty 1

## 2015-11-18 MED ORDER — PNEUMOCOCCAL VAC POLYVALENT 25 MCG/0.5ML IJ INJ
0.5000 mL | INJECTION | INTRAMUSCULAR | Status: AC
  Administered 2015-11-20: 0.5 mL via INTRAMUSCULAR
  Filled 2015-11-18: qty 0.5
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
## 2015-11-18 NOTE — Progress Notes (Signed)
MD notified of patient's seizure-like activity this morning lasting approximately less than one minute. RN administered  Ativan and airway was maintained. Patient now awake to verbal stimuli and responding appropriately. No new orders received at this time. RN will continue to monitor patient closely.

## 2015-11-18 NOTE — Progress Notes (Signed)
Pt has a seizure like activity lasted maybe a minute. Good airway maintained. BP 131/61 P91 O2 94%. Dr Luisa Hart made aware. Will continue to monitor pt.
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
## 2015-11-19 MED ORDER — MIDAZOLAM HCL 2 MG/2ML IJ SOLN: 2.0000 mg | INTRAMUSCULAR | Status: DC | PRN

## 2015-11-19 MED ORDER — FENTANYL CITRATE (PF) 100 MCG/2ML IJ SOLN
INTRAMUSCULAR | Status: AC
  Filled 2015-11-19: qty 4

## 2015-11-19 MED ORDER — ADULT MULTIVITAMIN W/MINERALS CH
1.0000 | ORAL_TABLET | Freq: Every day | ORAL | Status: DC
  Administered 2015-11-20 – 2015-12-03 (×14): 1 via ORAL
  Filled 2015-11-19 (×14): qty 1

## 2015-11-19 MED ORDER — ACETAMINOPHEN 500 MG PO TABS
1000.0000 mg | ORAL_TABLET | Freq: Three times a day (TID) | ORAL | Status: DC
  Administered 2015-11-19 – 2015-12-03 (×35): 1000 mg via ORAL
  Filled 2015-11-19 (×42): qty 2

## 2015-11-19 MED ORDER — FENTANYL BOLUS VIA INFUSION
50.0000 ug | INTRAVENOUS | Status: DC | PRN
  Filled 2015-11-19: qty 50

## 2015-11-19 MED ORDER — VITAMIN B-1 100 MG PO TABS
100.0000 mg | ORAL_TABLET | Freq: Every day | ORAL | Status: DC
  Administered 2015-11-22 – 2015-12-03 (×12): 100 mg via ORAL
  Filled 2015-11-19 (×14): qty 1

## 2015-11-19 MED ORDER — LORAZEPAM 2 MG/ML IJ SOLN
INTRAMUSCULAR | Status: AC
  Filled 2015-11-19: qty 1

## 2015-11-19 MED ORDER — MIDAZOLAM HCL 2 MG/2ML IJ SOLN
INTRAMUSCULAR | Status: AC
  Filled 2015-11-19: qty 2

## 2015-11-19 MED ORDER — LORAZEPAM 2 MG/ML IJ SOLN
1.0000 mg | Freq: Four times a day (QID) | INTRAMUSCULAR | Status: AC | PRN
  Administered 2015-11-19 – 2015-11-22 (×5): 1 mg via INTRAVENOUS
  Filled 2015-11-19 (×6): qty 1

## 2015-11-19 MED ORDER — PROPOFOL 1000 MG/100ML IV EMUL
INTRAVENOUS | Status: AC
  Filled 2015-11-19: qty 100

## 2015-11-19 MED ORDER — DIPHENHYDRAMINE HCL 12.5 MG/5ML PO ELIX
25.0000 mg | ORAL_SOLUTION | Freq: Three times a day (TID) | ORAL | Status: DC | PRN
  Administered 2015-11-19 (×2): 25 mg via ORAL
  Administered 2015-11-20: 50 mg via ORAL
  Filled 2015-11-19: qty 20
  Filled 2015-11-19: qty 10
  Filled 2015-11-19: qty 20
  Filled 2015-11-19: qty 10

## 2015-11-19 MED ORDER — MIDAZOLAM HCL 2 MG/2ML IJ SOLN
INTRAMUSCULAR | Status: AC
  Filled 2015-11-19: qty 4

## 2015-11-19 MED ORDER — LORAZEPAM 1 MG PO TABS
1.0000 mg | ORAL_TABLET | Freq: Four times a day (QID) | ORAL | Status: AC | PRN
  Administered 2015-11-20: 1 mg via ORAL

## 2015-11-19 MED ORDER — FOLIC ACID 1 MG PO TABS
1.0000 mg | ORAL_TABLET | Freq: Every day | ORAL | Status: DC
  Administered 2015-11-20 – 2015-12-03 (×15): 1 mg via ORAL
  Filled 2015-11-19 (×16): qty 1

## 2015-11-19 MED ORDER — SODIUM CHLORIDE 0.9 % IV SOLN
25.0000 ug/h | INTRAVENOUS | Status: DC
  Filled 2015-11-19: qty 50

## 2015-11-19 MED ORDER — FENTANYL CITRATE (PF) 100 MCG/2ML IJ SOLN: 50.0000 ug | Freq: Once | INTRAMUSCULAR | Status: DC

## 2015-11-19 MED ORDER — THIAMINE HCL 100 MG/ML IJ SOLN
100.0000 mg | Freq: Every day | INTRAMUSCULAR | Status: DC
  Administered 2015-11-20 – 2015-11-21 (×2): 100 mg via INTRAVENOUS
  Filled 2015-11-19 (×3): qty 1

## 2015-11-19 MED ORDER — ARTIFICIAL TEARS OP OINT
TOPICAL_OINTMENT | OPHTHALMIC | Status: DC
  Administered 2015-11-19 – 2015-11-20 (×12): via OPHTHALMIC
  Administered 2015-11-20: 1 via OPHTHALMIC
  Administered 2015-11-20 – 2015-11-22 (×15): via OPHTHALMIC
  Administered 2015-11-22: 1 via OPHTHALMIC
  Administered 2015-11-22 – 2015-11-23 (×19): via OPHTHALMIC
  Administered 2015-11-24: 1 via OPHTHALMIC
  Administered 2015-11-24: 11:00:00 via OPHTHALMIC
  Administered 2015-11-24: 1 via OPHTHALMIC
  Administered 2015-11-24 – 2015-11-25 (×13): via OPHTHALMIC
  Administered 2015-11-25 (×3): 1 via OPHTHALMIC
  Administered 2015-11-25 – 2015-11-26 (×6): via OPHTHALMIC
  Administered 2015-11-26 (×2): 1 via OPHTHALMIC
  Administered 2015-11-26 – 2015-12-03 (×58): via OPHTHALMIC
  Filled 2015-11-19 (×5): qty 3.5

## 2015-11-19 MED ORDER — OXYCODONE HCL 5 MG PO TABS
10.0000 mg | ORAL_TABLET | ORAL | Status: DC | PRN
  Administered 2015-11-19 (×3): 20 mg via ORAL
  Administered 2015-11-20: 10 mg via ORAL
  Administered 2015-11-20: 20 mg via ORAL
  Administered 2015-11-20 – 2015-11-21 (×2): 10 mg via ORAL
  Filled 2015-11-19 (×2): qty 4
  Filled 2015-11-19: qty 2
  Filled 2015-11-19 (×3): qty 4
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
## 2015-11-19 NOTE — Progress Notes (Signed)
Marked change in mental status with seizure and anisocoria.  Patient transferred to ICU, intubated and sent for a stat Ct head.Marta Lamas Gae Bon, MD, FACS [phone] Trauma Surgeon
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
## 2015-11-19 NOTE — Progress Notes (Signed)
Patient continues to c/o itching and family states he had hives earlier. No hives observed by this nurse earlier or right now.  When investigated he says he has has issues with Naprosyn in past, tongue swelling, refused pm dose. Will dc Naprosyn and leave sticky note for MD.

## 2015-11-20 ENCOUNTER — Encounter (HOSPITAL_COMMUNITY): Payer: Self-pay | Admitting: Radiology

## 2015-11-20 ENCOUNTER — Inpatient Hospital Stay (HOSPITAL_COMMUNITY): Payer: Self-pay

## 2015-11-20 ENCOUNTER — Inpatient Hospital Stay (HOSPITAL_COMMUNITY): Payer: MEDICAID

## 2015-11-20 LAB — GLUCOSE, CAPILLARY: Glucose-Capillary: 121 mg/dL — ABNORMAL HIGH (ref 65–99)

## 2015-11-20 LAB — BASIC METABOLIC PANEL
ANION GAP: 6 (ref 5–15)
BUN: 6 mg/dL (ref 6–20)
CHLORIDE: 106 mmol/L (ref 101–111)
CO2: 29 mmol/L (ref 22–32)
Calcium: 8.4 mg/dL — ABNORMAL LOW (ref 8.9–10.3)
Creatinine, Ser: 1.34 mg/dL — ABNORMAL HIGH (ref 0.61–1.24)
GFR calc Af Amer: >60 mL/min (ref >60)
GFR calc non Af Amer: >60 mL/min (ref >60)
GLUCOSE: 90 mg/dL (ref 65–99)
POTASSIUM: 4.1 mmol/L (ref 3.5–5.1)
Sodium: 141 mmol/L (ref 135–145)

## 2015-11-20 LAB — MAGNESIUM: Magnesium: 1.4 mg/dL — ABNORMAL LOW (ref 1.7–2.4)

## 2015-11-20 LAB — CBC WITH DIFFERENTIAL/PLATELET
BASOS ABS: 0 10*3/uL (ref 0.0–0.1)
Basophils Relative: 0 %
Eosinophils Absolute: 0.1 10*3/uL (ref 0.0–0.7)
Eosinophils Relative: 1 %
HEMATOCRIT: 33.4 % — AB (ref 39.0–52.0)
HEMOGLOBIN: 11.1 g/dL — AB (ref 13.0–17.0)
LYMPHS PCT: 13 %
Lymphs Abs: 1.3 10*3/uL (ref 0.7–4.0)
MCH: 34.3 pg — ABNORMAL HIGH (ref 26.0–34.0)
MCHC: 33.2 g/dL (ref 30.0–36.0)
MCV: 103.1 fL — AB (ref 78.0–100.0)
MONO ABS: 1 10*3/uL (ref 0.1–1.0)
Monocytes Relative: 10 %
NEUTROS ABS: 7.6 10*3/uL (ref 1.7–7.7)
NEUTROS PCT: 76 %
Platelets: 173 10*3/uL (ref 150–400)
RBC: 3.24 MIL/uL — AB (ref 4.22–5.81)
RDW: 13.5 % (ref 11.5–15.5)
WBC: 10 10*3/uL (ref 4.0–10.5)

## 2015-11-20 LAB — POCT I-STAT 3, ART BLOOD GAS (G3+)
Acid-Base Excess: 3 mmol/L — ABNORMAL HIGH (ref 0.0–2.0)
Acid-Base Excess: 4 mmol/L — ABNORMAL HIGH (ref 0.0–2.0)
BICARBONATE: 30.9 meq/L — AB (ref 20.0–24.0)
Bicarbonate: 28.9 meq/L — ABNORMAL HIGH (ref 20.0–24.0)
O2 SAT: 98 %
O2 Saturation: 97 %
PCO2 ART: 55.4 mmHg — AB (ref 35.0–45.0)
PH ART: 7.355 (ref 7.350–7.450)
PH ART: 7.376 (ref 7.350–7.450)
PO2 ART: 101 mmHg — AB (ref 80.0–100.0)
Patient temperature: 98.6
Patient temperature: 98.6
TCO2: 30 mmol/L (ref 0–100)
TCO2: 33 mmol/L (ref 0–100)
pCO2 arterial: 49.4 mmHg — ABNORMAL HIGH (ref 35.0–45.0)
pO2, Arterial: 115 mmHg — ABNORMAL HIGH (ref 80.0–100.0)

## 2015-11-20 LAB — MRSA PCR SCREENING: MRSA by PCR: NEGATIVE

## 2015-11-20 LAB — TRIGLYCERIDES: Triglycerides: 84 mg/dL (ref <150)

## 2015-11-20 MED ORDER — MAGNESIUM SULFATE 2 GM/50ML IV SOLN
2.0000 g | Freq: Once | INTRAVENOUS | Status: AC
Start: 2015-11-20 — End: 2015-11-20
  Administered 2015-11-20: 2 g via INTRAVENOUS
  Filled 2015-11-20: qty 50

## 2015-11-20 MED ORDER — MIDAZOLAM HCL 5 MG/ML IJ SOLN: 5.0000 mg | Freq: Once | INTRAMUSCULAR | Status: DC

## 2015-11-20 MED ORDER — CHLORHEXIDINE GLUCONATE 0.12% ORAL RINSE (MEDLINE KIT)
15.0000 mL | Freq: Two times a day (BID) | OROMUCOSAL | Status: DC
  Administered 2015-11-20 – 2015-12-03 (×14): 15 mL via OROMUCOSAL
  Filled 2015-11-20 (×3): qty 15

## 2015-11-20 MED ORDER — SODIUM CHLORIDE 0.9 % IV SOLN
25.0000 ug/h | INTRAVENOUS | Status: DC
  Administered 2015-11-20: 50 ug/h via INTRAVENOUS
  Filled 2015-11-20: qty 50

## 2015-11-20 MED ORDER — ANTISEPTIC ORAL RINSE SOLUTION (CORINZ)
7.0000 mL | Freq: Four times a day (QID) | OROMUCOSAL | Status: DC
  Administered 2015-11-20 – 2015-11-22 (×10): 7 mL via OROMUCOSAL

## 2015-11-20 MED ORDER — PANTOPRAZOLE SODIUM 40 MG IV SOLR
40.0000 mg | INTRAVENOUS | Status: DC
  Administered 2015-11-20 – 2015-11-23 (×4): 40 mg via INTRAVENOUS
  Filled 2015-11-20 (×4): qty 40

## 2015-11-20 MED ORDER — DIVALPROEX SODIUM 250 MG PO DR TAB
250.0000 mg | DELAYED_RELEASE_TABLET | Freq: Two times a day (BID) | ORAL | Status: DC
  Filled 2015-11-20 (×3): qty 1

## 2015-11-20 MED ORDER — FENTANYL BOLUS VIA INFUSION
50.0000 ug | INTRAVENOUS | Status: DC | PRN
  Filled 2015-11-20: qty 50

## 2015-11-20 MED ORDER — PROPOFOL 1000 MG/100ML IV EMUL: 0.0000 ug/kg/min | INTRAVENOUS | Status: DC

## 2015-11-20 MED ORDER — MIDAZOLAM HCL 2 MG/2ML IJ SOLN: 5.0000 mg | Freq: Once | INTRAMUSCULAR | Status: DC

## 2015-11-20 MED ORDER — ROCURONIUM BROMIDE 50 MG/5ML IV SOLN: 30.0000 mg | Freq: Once | INTRAVENOUS | Status: DC

## 2015-11-20 MED ORDER — PHENOBARBITAL SODIUM 65 MG/ML IJ SOLN
65.0000 mg | Freq: Two times a day (BID) | INTRAMUSCULAR | Status: DC
  Administered 2015-11-21 – 2015-11-22 (×4): 65 mg via INTRAVENOUS
  Filled 2015-11-20 (×4): qty 1

## 2015-11-20 MED ORDER — FENTANYL CITRATE (PF) 100 MCG/2ML IJ SOLN: 50.0000 ug | Freq: Once | INTRAMUSCULAR | Status: DC

## 2015-11-20 MED ORDER — VALPROATE SODIUM 500 MG/5ML IV SOLN
1000.0000 mg | Freq: Once | INTRAVENOUS | Status: AC
  Administered 2015-11-20: 1000 mg via INTRAVENOUS
  Filled 2015-11-20: qty 10

## 2015-11-20 MED ORDER — WHITE PETROLATUM GEL
Status: AC
  Administered 2015-11-20: 0.2
  Filled 2015-11-20: qty 1

## 2015-11-20 MED ORDER — FENTANYL CITRATE (PF) 100 MCG/2ML IJ SOLN: 100.0000 ug | Freq: Once | INTRAMUSCULAR | Status: DC

## 2015-11-20 NOTE — Final Progress Note (Signed)
Called by primary RN to see pt for seizures.  Per report the pt had several seizures in a short period of time.  On arrival to the room the pt appeared to be postictal with snorous respirations and sats 25% with a good waveform.  Pt was placed on NRB until a BVM could be obtained.  Jaw-thrust to open airway assisted in elevating sats to 80s.  BVM applied and pt was bagged to obtain sats 96-100%.  Further assessment revealed Rt pupil 1-66mm & Lt pupil 4-22mm.  Pt with involuntary twitching of all extremities.  Dr. Lindie Spruce was on the floor and came in to the room.  Pt bagged to the MICU where he was intubated.  Stat CT head planned.

## 2015-11-20 NOTE — Progress Notes (Signed)
Late entry;2303 Called to room by pt's. wife. Pt.found to be having seizure-like activity. VS obtained and recorded with 02 sats @ 30's-40 %. RRN responded to call; bagged pt.Sats 100%. MD @ bs ordrs to transfer to ICU. Report given to receiving nurse. Wife accompanied to waiting area with pt's. belongings.

## 2015-11-20 NOTE — Procedures (Signed)
Extubation Procedure Note  Patient Details:   Name: Brion Sossamon DOB: 06/02/1967 MRN: [id number]   Airway Documentation:     Evaluation  O2 sats: stable throughout Complications: No apparent complications Patient did tolerate procedure well. Bilateral Breath Sounds: Clear Suctioning: Oral, Airway Yes   Pt extubated at this time per MD order, placed on 4L [place] tolerating well, no distress noted, RT will monitor   Cherylin Mylar 11/20/2015, 10:45 AM

## 2015-11-20 NOTE — Progress Notes (Signed)
Wasted 90mL of propofol and  20 mg of rocuronium with Caralee Ates RN

## 2015-11-20 NOTE — Progress Notes (Signed)
LTVM started.  Pt and wife instructed regarding test and event button.
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
## 2015-11-20 NOTE — Progress Notes (Signed)
eLink Physician-Brief Progress Note Patient Name: David Benson DOB: 06/02/1967 MRN: [id number]   Date of Service  11/20/2015  HPI/Events of Note  Best Practice  eICU Interventions  PPI for stress ulcer propy while intubated     Intervention Category Intermediate Interventions: Best-practice therapies (e.g. DVT, beta blocker, etc.)  Mikeya Tomasetti 11/20/2015, 6:38 AM

## 2015-11-21 LAB — CBC
HEMATOCRIT: 34.3 % — AB (ref 39.0–52.0)
HEMOGLOBIN: 11.2 g/dL — AB (ref 13.0–17.0)
MCH: 34.1 pg — ABNORMAL HIGH (ref 26.0–34.0)
MCHC: 32.7 g/dL (ref 30.0–36.0)
MCV: 104.6 fL — ABNORMAL HIGH (ref 78.0–100.0)
Platelets: 182 10*3/uL (ref 150–400)
RBC: 3.28 MIL/uL — ABNORMAL LOW (ref 4.22–5.81)
RDW: 13.3 % (ref 11.5–15.5)
WBC: 10.1 10*3/uL (ref 4.0–10.5)

## 2015-11-21 LAB — BASIC METABOLIC PANEL
Anion gap: 9 (ref 5–15)
BUN: <5 mg/dL — ABNORMAL LOW (ref 6–20)
CHLORIDE: 105 mmol/L (ref 101–111)
CO2: 28 mmol/L (ref 22–32)
Calcium: 8.5 mg/dL — ABNORMAL LOW (ref 8.9–10.3)
Creatinine, Ser: 1.3 mg/dL — ABNORMAL HIGH (ref 0.61–1.24)
Glucose, Bld: 77 mg/dL (ref 65–99)
POTASSIUM: 4.4 mmol/L (ref 3.5–5.1)
SODIUM: 142 mmol/L (ref 135–145)

## 2015-11-21 LAB — MAGNESIUM: MAGNESIUM: 1.8 mg/dL (ref 1.7–2.4)

## 2015-11-21 LAB — VALPROIC ACID LEVEL: VALPROIC ACID LVL: 40 ug/mL — AB (ref 50.0–100.0)

## 2015-11-21 MED ORDER — DIPHENHYDRAMINE HCL 12.5 MG/5ML PO ELIX
25.0000 mg | ORAL_SOLUTION | Freq: Three times a day (TID) | ORAL | Status: DC | PRN
  Administered 2015-11-21: 12.5 mg via ORAL
  Administered 2015-11-23 – 2015-12-03 (×15): 25 mg via ORAL
  Filled 2015-11-21 (×16): qty 10

## 2015-11-21 MED ORDER — OXYCODONE HCL 5 MG PO TABS
5.0000 mg | ORAL_TABLET | ORAL | Status: DC | PRN
  Administered 2015-11-22 – 2015-11-23 (×4): 10 mg via ORAL
  Administered 2015-11-23: 15 mg via ORAL
  Administered 2015-11-23 – 2015-11-24 (×3): 10 mg via ORAL
  Administered 2015-11-24 – 2015-11-25 (×3): 15 mg via ORAL
  Filled 2015-11-21: qty 3
  Filled 2015-11-21 (×3): qty 2
  Filled 2015-11-21: qty 3
  Filled 2015-11-21 (×2): qty 2
  Filled 2015-11-21 (×2): qty 3
  Filled 2015-11-21 (×2): qty 2

## 2015-11-21 MED ORDER — SODIUM CHLORIDE 0.9 % IV SOLN
1000.0000 mg | Freq: Two times a day (BID) | INTRAVENOUS | Status: DC
  Administered 2015-11-21 – 2015-11-24 (×7): 1000 mg via INTRAVENOUS
  Filled 2015-11-21 (×8): qty 10

## 2015-11-21 MED ORDER — DEXTROSE 5 % IV SOLN
500.0000 mg | Freq: Two times a day (BID) | INTRAVENOUS | Status: DC
  Filled 2015-11-21: qty 5

## 2015-11-21 MED ORDER — VALPROATE SODIUM 500 MG/5ML IV SOLN
500.0000 mg | Freq: Two times a day (BID) | INTRAVENOUS | Status: DC
  Administered 2015-11-21 – 2015-11-23 (×5): 500 mg via INTRAVENOUS
  Filled 2015-11-21 (×7): qty 5

## 2015-11-21 NOTE — Progress Notes (Signed)
LTM EEG restarted. Electrodes reapplied and glued, head wrapped.
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
## 2015-11-21 NOTE — Progress Notes (Signed)
RN with patient at 3:56 he was able to follow commands and was wanting to get out of bed. At 3:58 patient became unresponsive and began shaking, both eyes gazed upward to the right with the right arm and leg shaking more than the left arm and leg medication was given, patient stopped shaking at 4:03. Oxygen saturation remained 99-100% patient became responsive and oriented minutes later. Neurology paged, RN will continue to monitor

## 2015-11-21 NOTE — Progress Notes (Signed)
Reapplied EEG leads after pt pulled them off. Pt has un blanchable redness on frontal portion of the head where electrode site is FP1. Electrode moved laterally very slightly. Head wrapped, sitter at bedside, neuro notified, event button tested and sitter educated

## 2015-11-21 NOTE — Progress Notes (Addendum)
OT / PT  Cancellation Note  Patient Details Name: David Benson MRN: [id number] DOB: 06/02/1967   Cancelled Treatment:    Reason Eval/Treat Not Completed: Medical issues which prohibited therapy (seizures currently with EEG continuous)  Felecia Shelling   OTR/L Pager: [phone] Office: [phone] . Mylo Red, PT, DPT [phone]    11/21/2015, 11:10 AM
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
## 2015-11-21 NOTE — Procedures (Signed)
Patient Name:  David Benson  Date of Birth: 06/02/1967  EEG # [id number]   Referring Physician: Dr. Lavon Paganini.   History: 50 year old man with recurrent seizure like episodes.   Current Medications: keppra, depakote.  EEG description: This is a 24 hour video long-term monitoring study, this dictation covers day 1 of the patient's recording from onset to 7:26 AM on 11/21/15 the study is reviewed in its entirety. Interictal EEG in the beginning, has a normal 9-10 Hz posterior dominant rhythm, EEG is fairly well-organized with a well-formed anterior to posterior gradient. There are no interictal spikes or seizure discharges. There is no note made in the 24-hour EEG log or events list of any spells captured. The patient has pulled off some leads after 9 PM, and EEG becomes limited and then pulled off the remainder of the leads at 3 AM.  Interpretation: This is a limited 24-hour study, no evidence of interest are captured at least by report during the interpretable part of the recording. Interictal EEG in the early part of the recording is normal. Recommend continued 24-hour EEG monitoring, after reapplication of leads to capture events of interest.
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
## 2015-11-21 NOTE — Progress Notes (Signed)
LTM checked, pt had pulled off electrodes overnight.  MD aware.  Assessment in progress as to whether or not to reapply and restart LTM EEG.

## 2015-11-21 NOTE — Progress Notes (Signed)
Subjective: Had several episodes overnight. Most leads were unfortunately off during the event, but the lead I was able to see did not clearly show seizure(This is NOT enough to exclude seizure).   Exam: Filed Vitals:   11/21/15 0600 11/21/15 0759  BP: 107/72   Pulse:    Temp:  101.1 F (38.4 C)  Resp: 15    Gen: In bed, initially appears to be in pain.  Resp: non-labored breathing Abd: soft, nt CV: tacycardic  Neuro: MS: awake, complains of pain. Initially answering questions, then begins to stare fixedly, eyes midline, low amplitude jerking type movements, intially more prominent on the right, than more prominent on the left. Grimaces slightly to noxious stimuli during this event, but does not withdra.  ZO:XWRUE, eyes conjugate, staring fixed forwad.  Motor: does not withdraw, but will ikeep arms elevated.  Sensory:as above  Pertinent Labs: Mildly elevated creatinine  Impression: 50 yo M with a history of seizures and breakthrough events concerning for seizures. He had an event this morning. He responded well to 1 mg IV ativan, currently sedated. I do have some concern for possible non-epileptic event and would favor getting him hooked back up to EEG and will wrap the head to try to protect leads.   Recommendations: 1) Restart continuous EEG 2) will continue keppra/depakote at current doses for now.   Ritta Slot, MD [facility] [phone]  If 7pm- 7am, please page neurology on call as listed in AMION.

## 2015-11-22 LAB — BASIC METABOLIC PANEL
Anion gap: 12 (ref 5–15)
CALCIUM: 8.7 mg/dL — AB (ref 8.9–10.3)
CO2: 29 mmol/L (ref 22–32)
CREATININE: 1.11 mg/dL (ref 0.61–1.24)
Chloride: 98 mmol/L — ABNORMAL LOW (ref 101–111)
GFR calc non Af Amer: >60 mL/min (ref >60)
Glucose, Bld: 83 mg/dL (ref 65–99)
Potassium: 4.2 mmol/L (ref 3.5–5.1)
Sodium: 139 mmol/L (ref 135–145)

## 2015-11-22 LAB — CBC
HCT: 32.7 % — ABNORMAL LOW (ref 39.0–52.0)
Hemoglobin: 10.7 g/dL — ABNORMAL LOW (ref 13.0–17.0)
MCH: 34.1 pg — AB (ref 26.0–34.0)
MCHC: 32.7 g/dL (ref 30.0–36.0)
MCV: 104.1 fL — AB (ref 78.0–100.0)
PLATELETS: 187 10*3/uL (ref 150–400)
RBC: 3.14 MIL/uL — AB (ref 4.22–5.81)
RDW: 13.1 % (ref 11.5–15.5)
WBC: 6.2 10*3/uL (ref 4.0–10.5)

## 2015-11-22 MED ORDER — LORAZEPAM 2 MG/ML IJ SOLN
2.0000 mg | Freq: Once | INTRAMUSCULAR | Status: AC
  Administered 2015-11-22: 2 mg via INTRAVENOUS

## 2015-11-22 MED ORDER — CETYLPYRIDINIUM CHLORIDE 0.05 % MT LIQD
7.0000 mL | Freq: Two times a day (BID) | OROMUCOSAL | Status: DC
  Administered 2015-11-22 – 2015-12-03 (×10): 7 mL via OROMUCOSAL
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
## 2015-11-22 NOTE — Clinical Documentation Improvement (Signed)
Trauma  Can the diagnosis of CKD be further specified?   CKD Stage I - GFR greater than or equal to 90  CKD Stage II - GFR 60-89  CKD Stage III - GFR 30-59  CKD Stage IV - GFR 15-29  Other condition  Unable to clinically determine   Supporting Information: : (risk factors, signs and symptoms, diagnostics, treatment) Suspect CKD, but creatinine normalizing per 01/31 progress notes. Labs:     Bun       Creat       GFR: 01/31:     <5         1.11         >60 01/30:    <5          1.30        >60  Please exercise your independent, professional judgment when responding. A specific answer is not anticipated or expected.   Thank Sabino Donovan [employer] [facility] [place] [phone]

## 2015-11-22 NOTE — Progress Notes (Signed)
LTM discontinued. No skin break down noted. 
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
## 2015-11-22 NOTE — Progress Notes (Signed)
Pt remains on medical ICU; continuous EEG completed-no seizure activity noted overnight.   Neurology continuing to follow for seizure management.  PTA, pt independent, living with spouse.  Will continue to follow progress.    Quintella Baton, RN, BSN  Trauma/Neuro ICU Case Manager [phone]

## 2015-11-22 NOTE — Progress Notes (Signed)
Subjective: Received ativan for agitation earlier this morning.   Exam: Filed Vitals:   11/22/15 0800 11/22/15 0801  BP: 125/78   Pulse: 89   Temp:  98.3 F (36.8 C)  Resp: 19    Gen: In bed, NAD Resp: non-labored breathing, no acute distress Abd: soft, nt  Neuro: MS: drowsy but rousable, gives correct year, knows president,  CN: fixates and tracks Motor: moves all extremities to command, does nto give good effort.  Sensory:responds to stim x 4.    Impression: 50 yo M with a history of seizures and breakthrough events concerning for seizures. He had an event this morning. I do have some concern for possible non-epileptic events but no continued spells. At this point, I think getting that there is diminishing return to continued monitoring and will discontinue this. I think a lot of his sedation.   Recommendations: 1) Discontinue LTM 2) continue keppra/depakote 3) will continue to follow.   Ritta Slot, MD [facility] [phone]  If 7pm- 7am, please page neurology on call as listed in AMION.
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
## 2015-11-22 NOTE — Progress Notes (Signed)
Pt transferred to 3M08 without incident, bedside report to Gae Bon, Charity fundraiser.

## 2015-11-22 NOTE — Clinical Documentation Improvement (Signed)
Trauma  Can the diagnosis of Respiratory Failure be further specified?   Document Inclusion Of - Hypoxia, Hypercapnia, Combination of Both  Other  Clinically Undetermined  Document any associated diagnoses/conditions.   Supporting Information: Acute respiratory failure secondary to seizures per 01/29 progress notes.   Please exercise your independent, professional judgment when responding. A specific answer is not anticipated or expected.   Thank Sabino Donovan Health Information Management [place] [phone]

## 2015-11-23 ENCOUNTER — Inpatient Hospital Stay (HOSPITAL_COMMUNITY): Payer: Self-pay

## 2015-11-23 ENCOUNTER — Encounter (HOSPITAL_COMMUNITY): Payer: Self-pay | Admitting: Physical Medicine and Rehabilitation

## 2015-11-23 DIAGNOSIS — K089 Disorder of teeth and supporting structures, unspecified: Secondary | ICD-10-CM

## 2015-11-23 DIAGNOSIS — R1310 Dysphagia, unspecified: Secondary | ICD-10-CM | POA: Insufficient documentation

## 2015-11-23 DIAGNOSIS — S0292XA Unspecified fracture of facial bones, initial encounter for closed fracture: Secondary | ICD-10-CM | POA: Diagnosis present

## 2015-11-23 DIAGNOSIS — S0452XS Injury of facial nerve, left side, sequela: Secondary | ICD-10-CM

## 2015-11-23 DIAGNOSIS — S0452XA Injury of facial nerve, left side, initial encounter: Secondary | ICD-10-CM | POA: Diagnosis present

## 2015-11-23 DIAGNOSIS — T148 Other injury of unspecified body region: Secondary | ICD-10-CM

## 2015-11-23 DIAGNOSIS — D539 Nutritional anemia, unspecified: Secondary | ICD-10-CM | POA: Insufficient documentation

## 2015-11-23 DIAGNOSIS — F1010 Alcohol abuse, uncomplicated: Secondary | ICD-10-CM | POA: Diagnosis present

## 2015-11-23 DIAGNOSIS — W3400XA Accidental discharge from unspecified firearms or gun, initial encounter: Secondary | ICD-10-CM

## 2015-11-23 DIAGNOSIS — G40909 Epilepsy, unspecified, not intractable, without status epilepticus: Secondary | ICD-10-CM

## 2015-11-23 DIAGNOSIS — S0292XS Unspecified fracture of facial bones, sequela: Secondary | ICD-10-CM

## 2015-11-23 HISTORY — DX: Unspecified fracture of facial bones, initial encounter for closed fracture: S02.92XA

## 2015-11-23 LAB — BASIC METABOLIC PANEL
Anion gap: 7 (ref 5–15)
CHLORIDE: 98 mmol/L — AB (ref 101–111)
CO2: 35 mmol/L — AB (ref 22–32)
CREATININE: 1.03 mg/dL (ref 0.61–1.24)
Calcium: 8.8 mg/dL — ABNORMAL LOW (ref 8.9–10.3)
GFR calc Af Amer: >60 mL/min (ref >60)
GFR calc non Af Amer: >60 mL/min (ref >60)
Glucose, Bld: 96 mg/dL (ref 65–99)
POTASSIUM: 4.2 mmol/L (ref 3.5–5.1)
SODIUM: 140 mmol/L (ref 135–145)

## 2015-11-23 MED ORDER — NAPROXEN 250 MG PO TABS
500.0000 mg | ORAL_TABLET | Freq: Two times a day (BID) | ORAL | Status: DC
  Administered 2015-11-23: 500 mg via ORAL
  Filled 2015-11-23 (×4): qty 1

## 2015-11-23 MED ORDER — TRAMADOL HCL 50 MG PO TABS
100.0000 mg | ORAL_TABLET | Freq: Four times a day (QID) | ORAL | Status: DC
  Administered 2015-11-23 – 2015-11-25 (×8): 100 mg via ORAL
  Filled 2015-11-23 (×8): qty 2

## 2015-11-23 MED ORDER — DIPHENHYDRAMINE HCL 50 MG/ML IJ SOLN: 25.0000 mg | Freq: Once | INTRAMUSCULAR | Status: AC

## 2015-11-23 MED ORDER — DIPHENHYDRAMINE HCL 25 MG PO CAPS
25.0000 mg | ORAL_CAPSULE | Freq: Once | ORAL | Status: AC
  Administered 2015-11-23: 25 mg via ORAL
  Filled 2015-11-23: qty 1

## 2015-11-23 MED ORDER — PREDNISOLONE 5 MG PO TABS
60.0000 mg | ORAL_TABLET | Freq: Once | ORAL | Status: AC
  Administered 2015-11-23: 60 mg via ORAL
  Filled 2015-11-23 (×2): qty 12
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
## 2015-11-23 NOTE — Progress Notes (Addendum)
eLink Pharmacist-Brief Progress Note Patient Name: David Benson DOB: 06/02/1967 MRN: [id number]   Date of Service  11/23/2015  HPI/Events of Note  Pt recently extubated. Meds reviewed.   eICU Interventions  D/c SUP   eLink Provider: Eli Hose, PharmD 11/23/2015, 4:04 PM
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
## 2015-11-23 NOTE — Progress Notes (Signed)
Subjective: Much improved this AM  Exam: Filed Vitals:   11/23/15 0700 11/23/15 0802  BP: 99/65 110/61  Pulse: 85   Temp:  98.2 F (36.8 C)  Resp: 18 11   Gen: In bed, NAD Resp: non-labored breathing, no acute distress Abd: soft, nt Skin: rash under arms and on neck, slightly raised bumps visible.   Neuro: MS: awake, alert oriented to month/situation CN: fixates and tracks Motor: moves all extremities to command, does nto give good effort.  Sensory:responds to stim x 4.    Impression: 50 yo M with a history of seizures and breakthrough events concerning for seizures. IT is suspected that etoh withdrawal may have played a role in his seizures and therefore he was treated with phenobarbital. With rash, this could be related to either depakote or phenobarbital, and I would favor discontinuing these medications and continuing keppra as he is now exiting the window for withdrawal seizures.   I am still not certain if the events were epileptic or non-epileptic, but would continue AED treatment given uncertainty.   Recommendations: 1) Discontinue depakote/phenobarbital.  2) continue CIWA 3) continue keppra.  4) will continue to follow.   Ritta Slot, MD [facility] [phone]  If 7pm- 7am, please page neurology on call as listed in AMION.
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
## 2015-11-24 DIAGNOSIS — R569 Unspecified convulsions: Secondary | ICD-10-CM

## 2015-11-24 MED ORDER — HYDROCORTISONE 1 % EX CREA
TOPICAL_CREAM | CUTANEOUS | Status: DC | PRN
  Administered 2015-11-25: 1 via TOPICAL
  Administered 2015-11-26: 14:00:00 via TOPICAL
  Filled 2015-11-24 (×2): qty 28

## 2015-11-24 MED ORDER — LEVETIRACETAM 500 MG PO TABS
1000.0000 mg | ORAL_TABLET | Freq: Two times a day (BID) | ORAL | Status: DC
Start: 2015-11-24 — End: 2015-12-03
  Administered 2015-11-24 – 2015-12-03 (×18): 1000 mg via ORAL
  Filled 2015-11-24 (×18): qty 2

## 2015-11-24 MED ORDER — OXYCODONE HCL ER 10 MG PO T12A
10.0000 mg | EXTENDED_RELEASE_TABLET | Freq: Two times a day (BID) | ORAL | Status: DC
  Administered 2015-11-24 (×2): 10 mg via ORAL
  Filled 2015-11-24 (×2): qty 1

## 2015-11-24 NOTE — Progress Notes (Addendum)
Subjective: Has complaints of pain in jaw but no further seizures.   Exam: Filed Vitals:   11/24/15 0700 11/24/15 0900  BP: 114/91   Pulse: 79   Temp:  98.6 F (37 C)  Resp: 14     HEENT-  Normocephalic, no lesions, without obvious abnormality.  Normal external eye and conjunctiva.  Normal TM's bilaterally.  Normal auditory canals and external ears. Normal external nose, mucus membranes and septum.  Normal pharynx. Cardiovascular- S1, S2 normal, pulses palpable throughout   Lungs- chest clear, no wheezing, rales, normal symmetric air entry Abdomen- normal findings: bowel sounds normal Extremities- no edema   Neuro: MS: awake, alert oriented to month/situation CN: fixates and tracks Motor: moves all extremities to command, does nto give good effort.  Sensory:responds to stim x 4.     Pertinent Labs: none  Felicie Morn PA-C [facility] [phone]  Impression:  50 yo M with a history of seizures and breakthrough events concerning for seizures. IT is suspected that etoh withdrawal may have played a role in his seizures   Recommendations: Recommendations: 1) continue keppra 2) follow up out patient with neurology. We will sign off at this time. Please call with any further questions or concerns.   Ritta Slot, MD [facility] [phone]  If 7pm- 7am, please page neurology on call as listed in AMION.   11/24/2015, 9:27 AM
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
## 2015-11-24 NOTE — Progress Notes (Signed)
Inpatient Rehabilitation  Followed up after IP Rehab MD's consult.  Met with patient and discussed rehab options.  Wife not present, so I phoned to discuss options as well.  Will follow up tomorrow.    Carmelia Roller., CCC/SLP Admission Coordinator  [facility]  Cell [phone]

## 2015-11-25 ENCOUNTER — Encounter (HOSPITAL_COMMUNITY): Payer: Self-pay | Admitting: Dentistry

## 2015-11-25 DIAGNOSIS — K011 Impacted teeth: Secondary | ICD-10-CM | POA: Diagnosis present

## 2015-11-25 DIAGNOSIS — R51 Headache: Secondary | ICD-10-CM

## 2015-11-25 DIAGNOSIS — K083 Retained dental root: Secondary | ICD-10-CM

## 2015-11-25 DIAGNOSIS — R252 Cramp and spasm: Secondary | ICD-10-CM

## 2015-11-25 DIAGNOSIS — S025XXA Fracture of tooth (traumatic), initial encounter for closed fracture: Secondary | ICD-10-CM

## 2015-11-25 HISTORY — DX: Fracture of tooth (traumatic), initial encounter for closed fracture: S02.5XXA

## 2015-11-25 HISTORY — DX: Impacted teeth: K01.1

## 2015-11-25 LAB — BASIC METABOLIC PANEL
ANION GAP: 11 (ref 5–15)
BUN: 6 mg/dL (ref 6–20)
CHLORIDE: 95 mmol/L — AB (ref 101–111)
CO2: 37 mmol/L — ABNORMAL HIGH (ref 22–32)
CREATININE: 1.08 mg/dL (ref 0.61–1.24)
Calcium: 9.5 mg/dL (ref 8.9–10.3)
GFR calc Af Amer: >60 mL/min (ref >60)
Glucose, Bld: 97 mg/dL (ref 65–99)
POTASSIUM: 4 mmol/L (ref 3.5–5.1)
SODIUM: 143 mmol/L (ref 135–145)

## 2015-11-25 MED ORDER — MORPHINE SULFATE ER 15 MG PO TBCR
30.0000 mg | EXTENDED_RELEASE_TABLET | Freq: Two times a day (BID) | ORAL | Status: DC
  Administered 2015-11-25 – 2015-11-30 (×12): 30 mg via ORAL
  Filled 2015-11-25 (×12): qty 2

## 2015-11-25 MED ORDER — OXYMETAZOLINE HCL 0.05 % NA SOLN
1.0000 | Freq: Two times a day (BID) | NASAL | Status: DC
  Administered 2015-11-25 – 2015-12-02 (×14): 1 via NASAL
  Filled 2015-11-25 (×2): qty 15

## 2015-11-25 MED ORDER — HYDROCODONE-ACETAMINOPHEN 10-325 MG PO TABS
0.5000 | ORAL_TABLET | ORAL | Status: DC | PRN
  Administered 2015-11-25: 1 via ORAL
  Administered 2015-11-25 (×2): 2 via ORAL
  Administered 2015-11-25: 1 via ORAL
  Administered 2015-11-26 – 2015-11-27 (×5): 2 via ORAL
  Filled 2015-11-25 (×5): qty 2
  Filled 2015-11-25: qty 1
  Filled 2015-11-25 (×2): qty 2
  Filled 2015-11-25: qty 1

## 2015-11-25 NOTE — Progress Notes (Signed)
Patient to transfer to 6N12 report given to receiving nurse, all questions answered at this time.  Pt. VSS with no s/s of distress noted.  Patient stable at transfer.

## 2015-11-25 NOTE — Progress Notes (Signed)
Pt complaining of itching all over body.  Benadryl given twice with no relief.  Dr. Janee Morn on call notified and orders given for hydrocortisone cream PRN.  Will continue to monitor.
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
## 2015-11-25 NOTE — Progress Notes (Signed)
Inpatient Rehabilitation  Spoke with patient and wife regarding recommendations from dentist.  Plan to hold off on IP Rehab at this time until dental issues are addressed.    Charlane Ferretti., CCC/SLP Admission Coordinator  [facility]  Cell [phone]

## 2015-11-25 NOTE — Progress Notes (Signed)
Patients HR dropped down into the high 30's occuring for a few seconds and returned to the 60's.  Patient asymptomatic during episode.  VSS with no s/s of distress noted.  PA Jeffery updated received no verbal orders at this time.  Will continue to monitor.
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
## 2015-11-27 ENCOUNTER — Encounter (HOSPITAL_COMMUNITY): Payer: Self-pay | Admitting: *Deleted

## 2015-11-27 MED ORDER — CYCLOBENZAPRINE HCL 5 MG PO TABS
5.0000 mg | ORAL_TABLET | Freq: Three times a day (TID) | ORAL | Status: DC | PRN
  Administered 2015-11-27 – 2015-12-03 (×13): 5 mg via ORAL
  Filled 2015-11-27 (×13): qty 1

## 2015-11-27 MED ORDER — OXYCODONE HCL 5 MG PO TABS
10.0000 mg | ORAL_TABLET | ORAL | Status: DC | PRN
  Administered 2015-11-27 – 2015-12-02 (×24): 10 mg via ORAL
  Filled 2015-11-27 (×25): qty 2

## 2015-11-28 MED ORDER — OXYCODONE HCL 10 MG PO TABS: 10.0000 mg | ORAL_TABLET | ORAL | Status: DC | PRN

## 2015-11-28 MED ORDER — DOCUSATE SODIUM 100 MG PO CAPS: 100.0000 mg | ORAL_CAPSULE | Freq: Two times a day (BID) | ORAL | Status: DC

## 2015-11-28 MED ORDER — DIPHENHYDRAMINE HCL 12.5 MG/5ML PO ELIX: 25.0000 mg | ORAL_SOLUTION | Freq: Three times a day (TID) | ORAL | Status: DC | PRN

## 2015-11-28 MED ORDER — MORPHINE SULFATE ER 30 MG PO TBCR: 30.0000 mg | EXTENDED_RELEASE_TABLET | Freq: Two times a day (BID) | ORAL | Status: DC

## 2015-11-28 MED ORDER — POLYETHYLENE GLYCOL 3350 17 G PO PACK: 17.0000 g | PACK | Freq: Every day | ORAL | Status: DC

## 2015-11-28 MED ORDER — CHLORHEXIDINE GLUCONATE 0.12% ORAL RINSE (MEDLINE KIT): 15.0000 mL | Freq: Two times a day (BID) | OROMUCOSAL | Status: DC

## 2015-11-28 MED ORDER — ENOXAPARIN SODIUM 40 MG/0.4ML ~~LOC~~ SOLN: 40.0000 mg | SUBCUTANEOUS | Status: DC

## 2015-11-28 MED ORDER — NYSTATIN 100000 UNIT/GM EX POWD
Freq: Three times a day (TID) | CUTANEOUS | Status: DC
  Administered 2015-11-28 – 2015-12-03 (×14): via TOPICAL
  Filled 2015-11-28: qty 15

## 2015-11-28 MED ORDER — FOLIC ACID 1 MG PO TABS: 1.0000 mg | ORAL_TABLET | Freq: Every day | ORAL | Status: DC

## 2015-11-28 MED ORDER — THIAMINE HCL 100 MG PO TABS: 100.0000 mg | ORAL_TABLET | Freq: Every day | ORAL | Status: DC

## 2015-11-28 MED ORDER — CYCLOBENZAPRINE HCL 5 MG PO TABS: 5.0000 mg | ORAL_TABLET | Freq: Three times a day (TID) | ORAL | Status: DC | PRN

## 2015-11-28 MED ORDER — ARTIFICIAL TEARS OP OINT: TOPICAL_OINTMENT | OPHTHALMIC | Status: DC

## 2015-11-28 MED ORDER — CETYLPYRIDINIUM CHLORIDE 0.05 % MT LIQD: 7.0000 mL | Freq: Two times a day (BID) | OROMUCOSAL | Status: DC

## 2015-11-28 MED ORDER — ONDANSETRON HCL 4 MG PO TABS: 4.0000 mg | ORAL_TABLET | Freq: Four times a day (QID) | ORAL | Status: DC | PRN

## 2015-11-28 MED ORDER — NYSTATIN 100000 UNIT/GM EX POWD: CUTANEOUS | Status: DC

## 2015-11-28 MED ORDER — LEVETIRACETAM 1000 MG PO TABS: 1000.0000 mg | ORAL_TABLET | Freq: Two times a day (BID) | ORAL | Status: DC

## 2015-11-28 MED ORDER — MORPHINE SULFATE (PF) 2 MG/ML IV SOLN: 2.0000 mg | INTRAVENOUS | Status: DC | PRN
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
## 2015-11-28 NOTE — Progress Notes (Signed)
Radiology report and CD placed outside of patient's drawer in a folder.
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
## 2015-11-28 NOTE — Care Management Note (Signed)
Case Management Note  Patient Details  Name: David Benson MRN: [id number] Date of Birth: 06/02/1967  Subjective/Objective:   Pt requiring oral surgery to repair mandible and peridontal fracture.             Action/Plan: Plan dc to [facility] for admission and dental/oral surgery follow up.  Expected Discharge Date:    11/28/2015              Expected Discharge Plan:  Acute to Acute Transfer  In-House Referral:  Clinical Social Work  Discharge planning Services  CM Consult  Post Acute Care Choice:    Choice offered to:     DME Arranged:    DME Agency:     HH Arranged:    HH Agency:     Status of Service:  Completed, signed off  Medicare Important Message Given:    Date Medicare IM Given:    Medicare IM give by:    Date Additional Medicare IM Given:    Additional Medicare Important Message give by:     If discussed at Long Length of Stay Meetings, dates discussed:    Additional Comments:  Quintella Baton, RN, BSN  Trauma/Neuro ICU Case Manager [phone]

## 2015-11-28 NOTE — Progress Notes (Signed)
Rehab admissions - I spoke with patient.  He is doing too well to meet criteria for acute inpatient rehab admission.  He was walking around in the room and in his bathroom with no assistance.  Likely can discharge home with [facility] once he is medically ready.  #[phone]
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
## 2015-12-01 MED ORDER — PHENOL 1.4 % MT LIQD
1.0000 | OROMUCOSAL | Status: DC | PRN
  Administered 2015-12-01: 1 via OROMUCOSAL
  Filled 2015-12-01: qty 177

## 2015-12-01 MED ORDER — MORPHINE SULFATE ER 15 MG PO TBCR
30.0000 mg | EXTENDED_RELEASE_TABLET | Freq: Two times a day (BID) | ORAL | Status: DC
  Administered 2015-12-01 – 2015-12-02 (×3): 30 mg via ORAL
  Filled 2015-12-01 (×3): qty 2

## 2015-12-01 MED ORDER — MORPHINE SULFATE ER 15 MG PO TBCR: 30.0000 mg | EXTENDED_RELEASE_TABLET | Freq: Two times a day (BID) | ORAL | Status: DC
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
## 2015-12-02 ENCOUNTER — Inpatient Hospital Stay (HOSPITAL_COMMUNITY): Payer: MEDICAID

## 2015-12-02 MED ORDER — IOHEXOL 300 MG/ML  SOLN
75.0000 mL | Freq: Once | INTRAMUSCULAR | Status: AC | PRN
  Administered 2015-12-02: 75 mL via INTRAVENOUS

## 2015-12-02 MED ORDER — MORPHINE SULFATE ER 15 MG PO TBCR
30.0000 mg | EXTENDED_RELEASE_TABLET | Freq: Three times a day (TID) | ORAL | Status: DC
  Administered 2015-12-02 – 2015-12-03 (×4): 30 mg via ORAL
  Filled 2015-12-02 (×4): qty 2

## 2015-12-02 MED ORDER — OXYCODONE HCL 5 MG PO TABS
10.0000 mg | ORAL_TABLET | ORAL | Status: DC | PRN
Start: 2015-12-02 — End: 2015-12-03
  Administered 2015-12-02 – 2015-12-03 (×6): 20 mg via ORAL
  Filled 2015-12-02 (×6): qty 4
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
## 2015-12-02 NOTE — Progress Notes (Signed)
Pt has been told many times this shift to get OOB only with assistance. He has been noncompliant and found often outside in the hallway walking with the walker. Pt also wanting to go off unit. Instructed him that he had to stay on unit.

## 2015-12-02 NOTE — Progress Notes (Signed)
Per on call MD, we will "watch" temperature for now. Will continue to monitor patient.
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
## 2015-12-02 NOTE — Progress Notes (Signed)
MD on call paged due to patient spiking a temp of 101.9. No new orders at this time. Awaiting a return call. Will continue to monitor.

## 2015-12-02 NOTE — Progress Notes (Signed)
Patient found kneeling in hallway by nurse tech. Per patient, he was just walking and his knee gave out. Patient did not hit his head, went down on his knees only. Vitals checked, temp 100.8, blood pressure 132/85, pulse 96, respirations 17, 02 99% on room air. No signs of injury on assessment. Patient assisted back to bed; instructed not to get up without assistance (independent with walker prior to), also placed on bed alarm. Call bell, urinal and bedside table placed within reach.   Patient is requesting additional pain medication following event. Patient informed it was not time. Patient stated ", I am going to leave here because ya'll are not treating my pain" .MD on call paged. No new orders at this time. Will continue to monitor patient.

## 2015-12-03 NOTE — Progress Notes (Signed)
Per charge nurse, [facility] hospital called back and stated that the bed for the patient was now unavailable and they will re-evaluate in the morning. Patient and patient's wife made aware.
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
## 2015-12-03 NOTE — Progress Notes (Signed)
Report called to [facility] at [facility]. Phone number to the floor patient will be admitted on: [phone]. Admitting attending is Dr. Andreas Newport. Patient's room number is [id number].

## 2015-12-05 ENCOUNTER — Encounter (HOSPITAL_COMMUNITY): Payer: Self-pay | Admitting: *Deleted

## 2016-08-09 DIAGNOSIS — F1010 Alcohol abuse, uncomplicated: Secondary | ICD-10-CM | POA: Diagnosis present

## 2016-08-09 DIAGNOSIS — Z79899 Other long term (current) drug therapy: Secondary | ICD-10-CM

## 2016-08-09 DIAGNOSIS — Y905 Blood alcohol level of 100-119 mg/100 ml: Secondary | ICD-10-CM | POA: Diagnosis present

## 2016-08-09 DIAGNOSIS — F329 Major depressive disorder, single episode, unspecified: Secondary | ICD-10-CM | POA: Diagnosis present

## 2016-08-09 DIAGNOSIS — Z888 Allergy status to other drugs, medicaments and biological substances status: Secondary | ICD-10-CM

## 2016-08-09 DIAGNOSIS — F17210 Nicotine dependence, cigarettes, uncomplicated: Secondary | ICD-10-CM | POA: Diagnosis present

## 2016-08-09 DIAGNOSIS — K5651 Intestinal adhesions [bands], with partial obstruction: Principal | ICD-10-CM | POA: Diagnosis present

## 2016-08-09 DIAGNOSIS — Z91018 Allergy to other foods: Secondary | ICD-10-CM

## 2016-08-09 DIAGNOSIS — M1990 Unspecified osteoarthritis, unspecified site: Secondary | ICD-10-CM | POA: Diagnosis present

## 2016-08-09 DIAGNOSIS — G40409 Other generalized epilepsy and epileptic syndromes, not intractable, without status epilepticus: Secondary | ICD-10-CM | POA: Diagnosis present

## 2016-08-09 DIAGNOSIS — F1410 Cocaine abuse, uncomplicated: Secondary | ICD-10-CM | POA: Diagnosis present

## 2016-08-09 DIAGNOSIS — J449 Chronic obstructive pulmonary disease, unspecified: Secondary | ICD-10-CM | POA: Diagnosis present

## 2016-08-09 DIAGNOSIS — Z79891 Long term (current) use of opiate analgesic: Secondary | ICD-10-CM

## 2016-08-09 DIAGNOSIS — Z8249 Family history of ischemic heart disease and other diseases of the circulatory system: Secondary | ICD-10-CM

## 2016-08-09 DIAGNOSIS — Z880 Allergy status to penicillin: Secondary | ICD-10-CM

## 2016-08-09 DIAGNOSIS — Z9114 Patient's other noncompliance with medication regimen: Secondary | ICD-10-CM

## 2016-08-10 ENCOUNTER — Encounter (HOSPITAL_COMMUNITY): Payer: Self-pay

## 2016-08-10 ENCOUNTER — Inpatient Hospital Stay (HOSPITAL_COMMUNITY)
Admission: EM | Admit: 2016-08-10 | Discharge: 2016-08-13 | DRG: 390 | Disposition: A | Discharge status: Home / Self Care | Payer: Self-pay | Attending: Internal Medicine | Admitting: Internal Medicine

## 2016-08-10 ENCOUNTER — Inpatient Hospital Stay (HOSPITAL_COMMUNITY): Payer: Self-pay

## 2016-08-10 ENCOUNTER — Emergency Department (HOSPITAL_COMMUNITY): Payer: Self-pay

## 2016-08-10 DIAGNOSIS — Z0189 Encounter for other specified special examinations: Secondary | ICD-10-CM

## 2016-08-10 DIAGNOSIS — F1910 Other psychoactive substance abuse, uncomplicated: Secondary | ICD-10-CM | POA: Diagnosis present

## 2016-08-10 DIAGNOSIS — R531 Weakness: Secondary | ICD-10-CM

## 2016-08-10 DIAGNOSIS — K56609 Unspecified intestinal obstruction, unspecified as to partial versus complete obstruction: Secondary | ICD-10-CM | POA: Diagnosis present

## 2016-08-10 DIAGNOSIS — J449 Chronic obstructive pulmonary disease, unspecified: Secondary | ICD-10-CM

## 2016-08-10 DIAGNOSIS — R569 Unspecified convulsions: Secondary | ICD-10-CM

## 2016-08-10 LAB — ETHANOL: Alcohol, Ethyl (B): 116 mg/dL — ABNORMAL HIGH (ref <5)

## 2016-08-10 LAB — URINALYSIS, ROUTINE W REFLEX MICROSCOPIC
Bilirubin Urine: NEGATIVE
Glucose, UA: NEGATIVE mg/dL
Hgb urine dipstick: NEGATIVE
Ketones, ur: NEGATIVE mg/dL
LEUKOCYTES UA: NEGATIVE
NITRITE: NEGATIVE
PH: 6 (ref 5.0–8.0)
Protein, ur: NEGATIVE mg/dL
SPECIFIC GRAVITY, URINE: 1.031 — AB (ref 1.005–1.030)

## 2016-08-10 LAB — CG4 I-STAT (LACTIC ACID): Lactic Acid, Venous: 4.26 mmol/L (ref 0.5–1.9)

## 2016-08-10 LAB — COMPREHENSIVE METABOLIC PANEL
ALBUMIN: 4.5 g/dL (ref 3.5–5.0)
ALK PHOS: 64 U/L (ref 38–126)
ALT: 24 U/L (ref 17–63)
ALT: 23 U/L (ref 17–63)
ANION GAP: 11 (ref 5–15)
ANION GAP: 11 (ref 5–15)
AST: 34 U/L (ref 15–41)
AST: 29 U/L (ref 15–41)
Albumin: 4 g/dL (ref 3.5–5.0)
Alkaline Phosphatase: 61 U/L (ref 38–126)
BUN: 12 mg/dL (ref 6–20)
BUN: 11 mg/dL (ref 6–20)
CALCIUM: 9.4 mg/dL (ref 8.9–10.3)
CHLORIDE: 101 mmol/L (ref 101–111)
CHLORIDE: 101 mmol/L (ref 101–111)
CO2: 28 mmol/L (ref 22–32)
CO2: 29 mmol/L (ref 22–32)
Calcium: 9.3 mg/dL (ref 8.9–10.3)
Creatinine, Ser: 1.01 mg/dL (ref 0.61–1.24)
Creatinine, Ser: 0.96 mg/dL (ref 0.61–1.24)
GFR calc Af Amer: >60 mL/min (ref >60)
GFR calc non Af Amer: >60 mL/min (ref >60)
GLUCOSE: 98 mg/dL (ref 65–99)
Glucose, Bld: 96 mg/dL (ref 65–99)
POTASSIUM: 3.7 mmol/L (ref 3.5–5.1)
POTASSIUM: 4.2 mmol/L (ref 3.5–5.1)
SODIUM: 140 mmol/L (ref 135–145)
Sodium: 141 mmol/L (ref 135–145)
Total Bilirubin: 0.7 mg/dL (ref 0.3–1.2)
Total Bilirubin: 0.7 mg/dL (ref 0.3–1.2)
Total Protein: 7.7 g/dL (ref 6.5–8.1)
Total Protein: 7.1 g/dL (ref 6.5–8.1)

## 2016-08-10 LAB — CBC WITH DIFFERENTIAL/PLATELET
BASOS ABS: 0 10*3/uL (ref 0.0–0.1)
BASOS PCT: 0 %
Basophils Absolute: 0 10*3/uL (ref 0.0–0.1)
Basophils Relative: 0 %
EOS ABS: 0.1 10*3/uL (ref 0.0–0.7)
EOS PCT: 1 %
Eosinophils Absolute: 0 10*3/uL (ref 0.0–0.7)
Eosinophils Relative: 0 %
HCT: 43.2 % (ref 39.0–52.0)
HCT: 44.7 % (ref 39.0–52.0)
HEMOGLOBIN: 14.6 g/dL (ref 13.0–17.0)
HEMOGLOBIN: 14.7 g/dL (ref 13.0–17.0)
LYMPHS ABS: 0.7 10*3/uL (ref 0.7–4.0)
LYMPHS PCT: 9 %
Lymphocytes Relative: 13 %
Lymphs Abs: 1 10*3/uL (ref 0.7–4.0)
MCH: 33.7 pg (ref 26.0–34.0)
MCH: 33.9 pg (ref 26.0–34.0)
MCHC: 32.9 g/dL (ref 30.0–36.0)
MCHC: 33.8 g/dL (ref 30.0–36.0)
MCV: 103 fL — ABNORMAL HIGH (ref 78.0–100.0)
MCV: 99.8 fL (ref 78.0–100.0)
MONOS PCT: 3 %
Monocytes Absolute: 0.2 10*3/uL (ref 0.1–1.0)
Monocytes Absolute: 0.2 10*3/uL (ref 0.1–1.0)
Monocytes Relative: 3 %
NEUTROS PCT: 83 %
NEUTROS PCT: 88 %
Neutro Abs: 6.2 10*3/uL (ref 1.7–7.7)
Neutro Abs: 7.1 10*3/uL (ref 1.7–7.7)
PLATELETS: 227 10*3/uL (ref 150–400)
Platelets: 234 10*3/uL (ref 150–400)
RBC: 4.33 MIL/uL (ref 4.22–5.81)
RBC: 4.34 MIL/uL (ref 4.22–5.81)
RDW: 13.4 % (ref 11.5–15.5)
RDW: 13.6 % (ref 11.5–15.5)
WBC: 7.5 10*3/uL (ref 4.0–10.5)
WBC: 8.1 10*3/uL (ref 4.0–10.5)

## 2016-08-10 LAB — GLUCOSE, CAPILLARY
GLUCOSE-CAPILLARY: 111 mg/dL — AB (ref 65–99)
GLUCOSE-CAPILLARY: 87 mg/dL (ref 65–99)
Glucose-Capillary: 86 mg/dL (ref 65–99)

## 2016-08-10 LAB — RAPID URINE DRUG SCREEN, HOSP PERFORMED
AMPHETAMINES: NOT DETECTED
BARBITURATES: NOT DETECTED
BENZODIAZEPINES: NOT DETECTED
COCAINE: POSITIVE — AB
OPIATES: NOT DETECTED
TETRAHYDROCANNABINOL: NOT DETECTED

## 2016-08-10 LAB — CBG MONITORING, ED: Glucose-Capillary: 99 mg/dL (ref 65–99)

## 2016-08-10 LAB — MAGNESIUM: Magnesium: 2 mg/dL (ref 1.7–2.4)

## 2016-08-10 LAB — LIPASE, BLOOD: Lipase: 44 U/L (ref 11–51)

## 2016-08-10 LAB — LACTIC ACID, PLASMA: LACTIC ACID, VENOUS: 2 mmol/L — AB (ref 0.5–1.9)

## 2016-08-10 MED ORDER — ADULT MULTIVITAMIN W/MINERALS CH
1.0000 | ORAL_TABLET | Freq: Every day | ORAL | Status: DC
  Administered 2016-08-11 – 2016-08-13 (×3): 1 via ORAL
  Filled 2016-08-10 (×4): qty 1

## 2016-08-10 MED ORDER — ONDANSETRON HCL 4 MG PO TABS: 4.0000 mg | ORAL_TABLET | Freq: Four times a day (QID) | ORAL | Status: DC | PRN

## 2016-08-10 MED ORDER — SODIUM CHLORIDE 0.9 % IV BOLUS (SEPSIS)
1000.0000 mL | Freq: Once | INTRAVENOUS | Status: AC
  Administered 2016-08-10: 1000 mL via INTRAVENOUS

## 2016-08-10 MED ORDER — SODIUM CHLORIDE 0.9 % IV SOLN
1000.0000 mg | Freq: Two times a day (BID) | INTRAVENOUS | Status: DC
  Administered 2016-08-10 – 2016-08-12 (×5): 1000 mg via INTRAVENOUS
  Filled 2016-08-10 (×6): qty 10

## 2016-08-10 MED ORDER — IPRATROPIUM-ALBUTEROL 0.5-2.5 (3) MG/3ML IN SOLN
3.0000 mL | Freq: Four times a day (QID) | RESPIRATORY_TRACT | Status: DC | PRN
  Administered 2016-08-11: 3 mL via RESPIRATORY_TRACT
  Filled 2016-08-10: qty 3

## 2016-08-10 MED ORDER — SODIUM CHLORIDE 0.9 % IV SOLN
INTRAVENOUS | Status: AC
  Administered 2016-08-10 (×2): via INTRAVENOUS

## 2016-08-10 MED ORDER — LORAZEPAM 2 MG/ML IJ SOLN
0.0000 mg | Freq: Two times a day (BID) | INTRAMUSCULAR | Status: DC
  Filled 2016-08-10 (×2): qty 1

## 2016-08-10 MED ORDER — MORPHINE SULFATE (PF) 2 MG/ML IV SOLN
2.0000 mg | INTRAVENOUS | Status: DC | PRN
  Administered 2016-08-10 – 2016-08-12 (×11): 2 mg via INTRAVENOUS
  Filled 2016-08-10 (×13): qty 1

## 2016-08-10 MED ORDER — ACETAMINOPHEN 325 MG PO TABS: 650.0000 mg | ORAL_TABLET | Freq: Four times a day (QID) | ORAL | Status: DC | PRN

## 2016-08-10 MED ORDER — LORAZEPAM 1 MG PO TABS: 1.0000 mg | ORAL_TABLET | Freq: Four times a day (QID) | ORAL | Status: AC | PRN

## 2016-08-10 MED ORDER — VITAMIN B-1 100 MG PO TABS
100.0000 mg | ORAL_TABLET | Freq: Every day | ORAL | Status: DC
  Administered 2016-08-11 – 2016-08-13 (×3): 100 mg via ORAL
  Filled 2016-08-10 (×4): qty 1

## 2016-08-10 MED ORDER — LORAZEPAM 2 MG/ML IJ SOLN
INTRAMUSCULAR | Status: AC
  Administered 2016-08-10: 2 mg
  Filled 2016-08-10: qty 1

## 2016-08-10 MED ORDER — LORAZEPAM 2 MG/ML IJ SOLN
0.0000 mg | Freq: Four times a day (QID) | INTRAMUSCULAR | Status: AC
  Administered 2016-08-10: 1 mg via INTRAVENOUS
  Administered 2016-08-10 – 2016-08-12 (×2): 2 mg via INTRAVENOUS
  Filled 2016-08-10 (×3): qty 1

## 2016-08-10 MED ORDER — THIAMINE HCL 100 MG/ML IJ SOLN: 100.0000 mg | Freq: Every day | INTRAMUSCULAR | Status: DC

## 2016-08-10 MED ORDER — ONDANSETRON HCL 4 MG/2ML IJ SOLN
4.0000 mg | Freq: Four times a day (QID) | INTRAMUSCULAR | Status: DC | PRN
  Administered 2016-08-10 – 2016-08-11 (×3): 4 mg via INTRAVENOUS
  Filled 2016-08-10 (×4): qty 2

## 2016-08-10 MED ORDER — ACETAMINOPHEN 650 MG RE SUPP: 650.0000 mg | Freq: Four times a day (QID) | RECTAL | Status: DC | PRN

## 2016-08-10 MED ORDER — IOPAMIDOL (ISOVUE-300) INJECTION 61%
100.0000 mL | Freq: Once | INTRAVENOUS | Status: AC | PRN
  Administered 2016-08-10: 100 mL via INTRAVENOUS

## 2016-08-10 MED ORDER — DIATRIZOATE MEGLUMINE & SODIUM 66-10 % PO SOLN
90.0000 mL | Freq: Once | ORAL | Status: AC
  Administered 2016-08-10: 90 mL via NASOGASTRIC
  Filled 2016-08-10: qty 90

## 2016-08-10 MED ORDER — LORAZEPAM 2 MG/ML IJ SOLN
1.0000 mg | Freq: Four times a day (QID) | INTRAMUSCULAR | Status: AC | PRN
  Administered 2016-08-11 – 2016-08-12 (×3): 1 mg via INTRAVENOUS
  Filled 2016-08-10: qty 1

## 2016-08-10 MED ORDER — FOLIC ACID 1 MG PO TABS
1.0000 mg | ORAL_TABLET | Freq: Every day | ORAL | Status: DC
  Administered 2016-08-11 – 2016-08-13 (×3): 1 mg via ORAL
  Filled 2016-08-10 (×4): qty 1
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
## 2016-08-10 NOTE — ED Notes (Signed)
Upon transfer of patient to bed, patient began having tonic movements.  While assessing eyes, it was noted that eyes were rolled to the side.  Patient's face twitching as well.  Notified MD.
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
## 2016-08-10 NOTE — Progress Notes (Signed)
Patient sleeping, wife at bedside. Will attempt to provide SA resources at later time.

## 2016-08-11 LAB — URINE CULTURE: CULTURE: NO GROWTH

## 2016-08-11 LAB — BASIC METABOLIC PANEL
ANION GAP: 7 (ref 5–15)
BUN: 15 mg/dL (ref 6–20)
CALCIUM: 8.6 mg/dL — AB (ref 8.9–10.3)
CO2: 32 mmol/L (ref 22–32)
CREATININE: 1.21 mg/dL (ref 0.61–1.24)
Chloride: 105 mmol/L (ref 101–111)
Glucose, Bld: 83 mg/dL (ref 65–99)
Potassium: 3.8 mmol/L (ref 3.5–5.1)
SODIUM: 144 mmol/L (ref 135–145)

## 2016-08-11 LAB — CBC
HCT: 40.5 % (ref 39.0–52.0)
HEMOGLOBIN: 12.9 g/dL — AB (ref 13.0–17.0)
MCH: 33.6 pg (ref 26.0–34.0)
MCHC: 31.9 g/dL (ref 30.0–36.0)
MCV: 105.5 fL — ABNORMAL HIGH (ref 78.0–100.0)
PLATELETS: 206 10*3/uL (ref 150–400)
RBC: 3.84 MIL/uL — AB (ref 4.22–5.81)
RDW: 14 % (ref 11.5–15.5)
WBC: 7.3 10*3/uL (ref 4.0–10.5)

## 2016-08-11 LAB — GLUCOSE, CAPILLARY
Glucose-Capillary: 80 mg/dL (ref 65–99)
Glucose-Capillary: 83 mg/dL (ref 65–99)

## 2016-08-11 LAB — LACTIC ACID, PLASMA: LACTIC ACID, VENOUS: 0.8 mmol/L (ref 0.5–1.9)

## 2016-08-11 LAB — MAGNESIUM: MAGNESIUM: 2 mg/dL (ref 1.7–2.4)

## 2016-08-11 MED ORDER — SODIUM CHLORIDE 0.9 % IV SOLN
INTRAVENOUS | Status: DC
  Administered 2016-08-11 (×2): via INTRAVENOUS

## 2016-08-11 NOTE — Clinical Social Work Note (Signed)
Clinical Social Work Assessment  Patient Details  Name: David Benson MRN: [id number] Date of Birth: 06/02/1967  Date of referral:  08/11/16               Reason for consult:  Substance Use/ETOH Abuse                Permission sought to share information with:  Facility Sport and exercise psychologist, Family Supports Permission granted to share information::  Yes, Release of Information Signed  Name::      Agency::    [place]   Relationship::  Wife : David Benson Information:  [phone], [phone], [phone]  Housing/Transportation Living arrangements for the past 2 months:  [place] of Information:  Patient, Spouse Patient Interpreter Needed:  None Criminal Activity/Legal Involvement Pertinent to Current Situation/Hospitalization:  No - Comment as needed Significant Relationships:  Spouse Lives with:  Spouse Do you feel safe going back to the place where you live?  Yes Need for family participation in patient care:  Yes (Comment)  Care giving concerns:  [place] Substance Abuse/ Inpatient referral   Social Worker assessment / plan:  LCSWA met with patient at bedside, explained reason for consult. Patient agreeable to assessment/ resources. The patient acknowledged that he wanted his wife to stay in the room during assessment. Patient reports he drinks everyday and does not feel he has a problem. Patient reports he smokes cannabis. Patient did not confirm cocaine use. Patient informed LCSWA that he has heard good things about [facility] and feels he is ready to change. Patient wife encouraged his choice. LCSWA set initial phone interview for 10/24 at 3:00pm,Tuesday with [name] admissions. Facility with set up inpatient treatment details thereafter.  Patient agreeable to follow up.   Employment status:  [employer] information:  Self Pay (Medicaid Pending) PT Recommendations:    Information / Referral to community resources:  Residential Substance  Abuse Treatment Options  Patient/Family's Response to care:  Agreeable to care and responding well.   Patient/Family's Understanding of and Emotional Response to Diagnosis, Current Treatment, and Prognosis:  " I am tired of living like this I am ready to change."  Emotional Assessment Appearance:  Appears stated age Attitude/Demeanor/Rapport:    Affect (typically observed):  Calm, Accepting Orientation:  Oriented to Self, Oriented to Place, Oriented to  Time, Oriented to Situation Alcohol / Substance use:  Illicit Drugs, Alcohol Use, Tobacco Use Psych involvement (Current and /or in the community):  No (Comment)  Discharge Needs  Concerns to be addressed:  Substance Abuse Concerns Readmission within the last 30 days:  No Current discharge risk:    Barriers to Discharge:  Continued Medical Work up   [employer], LCSW 08/11/2016, 4:06 PM

## 2016-08-11 NOTE — Progress Notes (Signed)
S: Multiple loose bowel movements overnight. No pain.   Vitals, labs, intake/output, and orders reviewed at this time.  Gen: sleepy, awakens to voice.  H&N: EOMI, atraumatic, neck supple Chest: unlabored respirations, RRR Abd: soft, nontender, nondistended. NG output light. Suction tubing is connected to sump port... Ext: warm, no edema Neuro: grossly normal  Lines/tubes/drains: Ng. PIV  A/P:  PSBO. Contrast in colon on small bowel protocol. Remove Ng, start sips of clears.    David Blakeshelsea Sicilia Killough, MD [facility] [place] Pager [phone]

## 2016-08-11 NOTE — Progress Notes (Signed)
NG tube removed per order. Patient tolerated procedure well.
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
## 2016-08-12 LAB — GLUCOSE, CAPILLARY
GLUCOSE-CAPILLARY: 113 mg/dL — AB (ref 65–99)
GLUCOSE-CAPILLARY: 161 mg/dL — AB (ref 65–99)
GLUCOSE-CAPILLARY: 95 mg/dL (ref 65–99)
GLUCOSE-CAPILLARY: 96 mg/dL (ref 65–99)

## 2016-08-12 LAB — BASIC METABOLIC PANEL
ANION GAP: 4 — AB (ref 5–15)
BUN: 12 mg/dL (ref 6–20)
CHLORIDE: 105 mmol/L (ref 101–111)
CO2: 32 mmol/L (ref 22–32)
Calcium: 8.4 mg/dL — ABNORMAL LOW (ref 8.9–10.3)
Creatinine, Ser: 1.09 mg/dL (ref 0.61–1.24)
GFR calc non Af Amer: >60 mL/min (ref >60)
Glucose, Bld: 107 mg/dL — ABNORMAL HIGH (ref 65–99)
POTASSIUM: 3.9 mmol/L (ref 3.5–5.1)
Sodium: 141 mmol/L (ref 135–145)

## 2016-08-12 LAB — CBC
HEMATOCRIT: 38.4 % — AB (ref 39.0–52.0)
HEMOGLOBIN: 12.3 g/dL — AB (ref 13.0–17.0)
MCH: 34.2 pg — ABNORMAL HIGH (ref 26.0–34.0)
MCHC: 32 g/dL (ref 30.0–36.0)
MCV: 106.7 fL — ABNORMAL HIGH (ref 78.0–100.0)
Platelets: 186 10*3/uL (ref 150–400)
RBC: 3.6 MIL/uL — AB (ref 4.22–5.81)
RDW: 13.5 % (ref 11.5–15.5)
WBC: 4.2 10*3/uL (ref 4.0–10.5)

## 2016-08-12 LAB — MAGNESIUM: MAGNESIUM: 1.8 mg/dL (ref 1.7–2.4)

## 2016-08-12 MED ORDER — MORPHINE SULFATE (PF) 2 MG/ML IV SOLN: 1.0000 mg | INTRAVENOUS | Status: DC | PRN

## 2016-08-12 MED ORDER — CYCLOBENZAPRINE HCL 10 MG PO TABS
10.0000 mg | ORAL_TABLET | Freq: Three times a day (TID) | ORAL | Status: DC | PRN
  Administered 2016-08-12: 10 mg via ORAL
  Filled 2016-08-12: qty 1

## 2016-08-12 MED ORDER — FAMOTIDINE 20 MG PO TABS
20.0000 mg | ORAL_TABLET | Freq: Two times a day (BID) | ORAL | Status: DC
  Administered 2016-08-12 – 2016-08-13 (×2): 20 mg via ORAL
  Filled 2016-08-12 (×3): qty 1

## 2016-08-12 MED ORDER — OXYCODONE-ACETAMINOPHEN 5-325 MG PO TABS
1.0000 | ORAL_TABLET | Freq: Three times a day (TID) | ORAL | Status: DC | PRN
  Administered 2016-08-12 – 2016-08-13 (×3): 1 via ORAL
  Filled 2016-08-12 (×3): qty 1

## 2016-08-12 MED ORDER — LEVETIRACETAM 500 MG PO TABS
1000.0000 mg | ORAL_TABLET | Freq: Two times a day (BID) | ORAL | Status: DC
  Administered 2016-08-12 – 2016-08-13 (×2): 1000 mg via ORAL
  Filled 2016-08-12 (×2): qty 2
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
## 2016-08-12 NOTE — Progress Notes (Signed)
S: No acute events. Tolerating Po. Having bowel movements. Complains of pain in the superior aspect of his midline scar.  Vitals, labs, intake/output, and orders reviewed at this time.  Gen: A&Ox3, no distress  H&N: EOMI, atraumatic, neck supple Chest: unlabored respirations, RRR Abd: soft, nontender, nondistended, multiple old incisions well healed without obvious hernia Ext: warm, no edema Neuro: grossly normal  Lines/tubes/drains: PIV  A/P:  PSBO, resolved. We will sign off for now. Please do not hesitate to call with questions or concerns.    Phylliss Blakeshelsea Malek Skog, MD [facility] [place] Pager [phone]

## 2016-08-13 LAB — BASIC METABOLIC PANEL
ANION GAP: 5 (ref 5–15)
BUN: 13 mg/dL (ref 6–20)
CALCIUM: 8.6 mg/dL — AB (ref 8.9–10.3)
CO2: 31 mmol/L (ref 22–32)
Chloride: 103 mmol/L (ref 101–111)
Creatinine, Ser: 1.02 mg/dL (ref 0.61–1.24)
Glucose, Bld: 108 mg/dL — ABNORMAL HIGH (ref 65–99)
Potassium: 3.7 mmol/L (ref 3.5–5.1)
Sodium: 139 mmol/L (ref 135–145)

## 2016-08-13 LAB — CBC WITH DIFFERENTIAL/PLATELET
BASOS ABS: 0 10*3/uL (ref 0.0–0.1)
BASOS PCT: 0 %
Eosinophils Absolute: 0.3 10*3/uL (ref 0.0–0.7)
Eosinophils Relative: 7 %
HEMATOCRIT: 38.6 % — AB (ref 39.0–52.0)
Hemoglobin: 12.3 g/dL — ABNORMAL LOW (ref 13.0–17.0)
Lymphocytes Relative: 40 %
Lymphs Abs: 1.6 10*3/uL (ref 0.7–4.0)
MCH: 33.3 pg (ref 26.0–34.0)
MCHC: 31.9 g/dL (ref 30.0–36.0)
MCV: 104.6 fL — ABNORMAL HIGH (ref 78.0–100.0)
MONO ABS: 0.4 10*3/uL (ref 0.1–1.0)
Monocytes Relative: 12 %
NEUTROS ABS: 1.6 10*3/uL — AB (ref 1.7–7.7)
Neutrophils Relative %: 41 %
PLATELETS: 191 10*3/uL (ref 150–400)
RBC: 3.69 MIL/uL — AB (ref 4.22–5.81)
RDW: 13 % (ref 11.5–15.5)
WBC: 3.8 10*3/uL — AB (ref 4.0–10.5)

## 2016-08-13 LAB — MAGNESIUM: Magnesium: 1.5 mg/dL — ABNORMAL LOW (ref 1.7–2.4)

## 2016-08-13 LAB — LEVETIRACETAM LEVEL: LEVETIRACETAM: NOT DETECTED ug/mL (ref 10.0–40.0)

## 2016-08-13 LAB — GLUCOSE, CAPILLARY: GLUCOSE-CAPILLARY: 93 mg/dL (ref 65–99)

## 2016-08-13 MED ORDER — MAGNESIUM SULFATE 2 GM/50ML IV SOLN
2.0000 g | Freq: Once | INTRAVENOUS | Status: AC
  Administered 2016-08-13: 2 g via INTRAVENOUS
  Filled 2016-08-13: qty 50

## 2016-08-13 MED ORDER — LEVETIRACETAM 1000 MG PO TABS: 1000.0000 mg | ORAL_TABLET | Freq: Two times a day (BID) | ORAL | 0 refills | Status: DC

## 2016-08-13 MED ORDER — HYDROCODONE-ACETAMINOPHEN 5-325 MG PO TABS: 1.0000 | ORAL_TABLET | Freq: Four times a day (QID) | ORAL | 0 refills | Status: DC | PRN

## 2016-08-13 MED ORDER — CYCLOBENZAPRINE HCL 5 MG PO TABS: 5.0000 mg | ORAL_TABLET | Freq: Three times a day (TID) | ORAL | 0 refills | Status: DC | PRN
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
## 2016-08-13 NOTE — Progress Notes (Signed)
Pt is without PCP and insurance. This CM [facility] and with coupon for his Keppra. [facility] and the Norco [facility] at [facility]. Pt appreciative of CM assistance. Sandford Crazeora Ariane Ditullio RN,BSN,NCM [phone]

## 2016-08-13 NOTE — Progress Notes (Signed)
CSW provided patient with [facility] brochure and reminded him of his phone interview with them at 3:00pm tomorrow. Patient expressed understanding & is anxious to discharge.   No further CSW needs identified - CSW signing off.   [name], LCSW [facility] Clinical Social Worker cell #: [phone]

## 2018-03-04 ENCOUNTER — Encounter (HOSPITAL_COMMUNITY): Payer: Self-pay | Admitting: Emergency Medicine

## 2018-03-04 ENCOUNTER — Ambulatory Visit (HOSPITAL_COMMUNITY)
Admission: EM | Admit: 2018-03-04 | Discharge: 2018-03-04 | Disposition: A | Discharge status: Home / Self Care | Payer: Self-pay | Attending: Family Medicine | Admitting: Family Medicine

## 2018-03-04 DIAGNOSIS — Z833 Family history of diabetes mellitus: Secondary | ICD-10-CM | POA: Insufficient documentation

## 2018-03-04 DIAGNOSIS — Z791 Long term (current) use of non-steroidal anti-inflammatories (NSAID): Secondary | ICD-10-CM | POA: Insufficient documentation

## 2018-03-04 DIAGNOSIS — M778 Other enthesopathies, not elsewhere classified: Secondary | ICD-10-CM

## 2018-03-04 DIAGNOSIS — Z113 Encounter for screening for infections with a predominantly sexual mode of transmission: Secondary | ICD-10-CM

## 2018-03-04 DIAGNOSIS — Z9889 Other specified postprocedural states: Secondary | ICD-10-CM | POA: Insufficient documentation

## 2018-03-04 DIAGNOSIS — F17200 Nicotine dependence, unspecified, uncomplicated: Secondary | ICD-10-CM | POA: Insufficient documentation

## 2018-03-04 DIAGNOSIS — Z711 Person with feared health complaint in whom no diagnosis is made: Secondary | ICD-10-CM

## 2018-03-04 DIAGNOSIS — Z886 Allergy status to analgesic agent status: Secondary | ICD-10-CM | POA: Insufficient documentation

## 2018-03-04 DIAGNOSIS — Z91018 Allergy to other foods: Secondary | ICD-10-CM | POA: Insufficient documentation

## 2018-03-04 DIAGNOSIS — R369 Urethral discharge, unspecified: Secondary | ICD-10-CM | POA: Insufficient documentation

## 2018-03-04 DIAGNOSIS — K219 Gastro-esophageal reflux disease without esophagitis: Secondary | ICD-10-CM | POA: Insufficient documentation

## 2018-03-04 DIAGNOSIS — M779 Enthesopathy, unspecified: Secondary | ICD-10-CM | POA: Insufficient documentation

## 2018-03-04 DIAGNOSIS — Z8249 Family history of ischemic heart disease and other diseases of the circulatory system: Secondary | ICD-10-CM | POA: Insufficient documentation

## 2018-03-04 DIAGNOSIS — M1990 Unspecified osteoarthritis, unspecified site: Secondary | ICD-10-CM | POA: Insufficient documentation

## 2018-03-04 DIAGNOSIS — Z8261 Family history of arthritis: Secondary | ICD-10-CM | POA: Insufficient documentation

## 2018-03-04 DIAGNOSIS — M542 Cervicalgia: Secondary | ICD-10-CM | POA: Insufficient documentation

## 2018-03-04 DIAGNOSIS — Z79899 Other long term (current) drug therapy: Secondary | ICD-10-CM | POA: Insufficient documentation

## 2018-03-04 DIAGNOSIS — J449 Chronic obstructive pulmonary disease, unspecified: Secondary | ICD-10-CM | POA: Insufficient documentation

## 2018-03-04 DIAGNOSIS — Z880 Allergy status to penicillin: Secondary | ICD-10-CM | POA: Insufficient documentation

## 2018-03-04 DIAGNOSIS — R300 Dysuria: Secondary | ICD-10-CM | POA: Insufficient documentation

## 2018-03-04 DIAGNOSIS — Z202 Contact with and (suspected) exposure to infections with a predominantly sexual mode of transmission: Secondary | ICD-10-CM | POA: Insufficient documentation

## 2018-03-04 LAB — POCT URINALYSIS DIP (DEVICE)
Bilirubin Urine: NEGATIVE
Glucose, UA: NEGATIVE mg/dL
HGB URINE DIPSTICK: NEGATIVE
LEUKOCYTES UA: NEGATIVE
NITRITE: NEGATIVE
PH: 6 (ref 5.0–8.0)
Protein, ur: NEGATIVE mg/dL
Specific Gravity, Urine: 1.02 (ref 1.005–1.030)
UROBILINOGEN UA: 0.2 mg/dL (ref 0.0–1.0)

## 2018-03-04 MED ORDER — AZITHROMYCIN 250 MG PO TABS
1000.0000 mg | ORAL_TABLET | Freq: Once | ORAL | Status: AC
  Administered 2018-03-04: 1000 mg via ORAL

## 2018-03-04 MED ORDER — CEFTRIAXONE SODIUM 250 MG IJ SOLR
250.0000 mg | Freq: Once | INTRAMUSCULAR | Status: AC
  Administered 2018-03-04: 250 mg via INTRAMUSCULAR

## 2018-03-04 MED ORDER — IBUPROFEN 800 MG PO TABS: 800.0000 mg | ORAL_TABLET | Freq: Three times a day (TID) | ORAL | 0 refills | Status: DC

## 2018-03-04 MED ORDER — CEFTRIAXONE SODIUM 250 MG IJ SOLR
INTRAMUSCULAR | Status: AC
  Filled 2018-03-04: qty 250

## 2018-03-04 MED ORDER — STERILE WATER FOR INJECTION IJ SOLN
INTRAMUSCULAR | Status: AC
  Filled 2018-03-04: qty 10

## 2018-03-04 MED ORDER — AZITHROMYCIN 250 MG PO TABS
ORAL_TABLET | ORAL | Status: AC
  Filled 2018-03-04: qty 1
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
## 2018-03-04 NOTE — ED Triage Notes (Signed)
Pt requesting STD testing and sts right elbow pain

## 2018-03-04 NOTE — Discharge Instructions (Signed)
Use of ibuprofen three times a day, take with food. We have treated you for gonorrhea and chlamydia today, we are testing your urine for these. Will notify of you any positive findings and if any changes to treatment are needed.   Please withhold from intercourse for the next week. Please use condoms to prevent STD's.   Please establish with a primary care provider for recheck of symptoms.

## 2018-03-05 LAB — URINE CYTOLOGY ANCILLARY ONLY
CHLAMYDIA, DNA PROBE: NEGATIVE
Neisseria Gonorrhea: POSITIVE — AB
TRICH (WINDOWPATH): NEGATIVE

## 2018-03-06 ENCOUNTER — Telehealth (HOSPITAL_COMMUNITY): Payer: Self-pay

## 2018-03-06 NOTE — Telephone Encounter (Signed)
Test for gonorrhea was positive. This was treated at the urgent care visit with IM rocephin  and po zithromax 1g. Need to educate patient to refrain from sexual intercourse for 7 days after treatment to give the medicine time to work. Sexual partners need to be notified and tested/treated. Condoms may reduce risk of reinfection. Recheck or followup with PCP for further evaluation if symptoms are not improving. [facility] notified. Attempted to reach patient, patient was not at home. Encouraged family to please have patient return my call at his convenience.

## 2018-05-03 ENCOUNTER — Inpatient Hospital Stay (HOSPITAL_COMMUNITY): Payer: Self-pay

## 2018-05-03 ENCOUNTER — Emergency Department (HOSPITAL_COMMUNITY): Payer: Self-pay

## 2018-05-03 ENCOUNTER — Inpatient Hospital Stay (HOSPITAL_COMMUNITY)
Admission: EM | Admit: 2018-05-03 | Discharge: 2018-05-05 | DRG: 389 | Disposition: A | Discharge status: Home / Self Care | Payer: Self-pay | Attending: Family Medicine | Admitting: Family Medicine

## 2018-05-03 ENCOUNTER — Encounter (HOSPITAL_COMMUNITY): Payer: Self-pay | Admitting: Emergency Medicine

## 2018-05-03 ENCOUNTER — Other Ambulatory Visit: Payer: Self-pay

## 2018-05-03 DIAGNOSIS — I452 Bifascicular block: Secondary | ICD-10-CM | POA: Diagnosis present

## 2018-05-03 DIAGNOSIS — Z8719 Personal history of other diseases of the digestive system: Secondary | ICD-10-CM

## 2018-05-03 DIAGNOSIS — K56609 Unspecified intestinal obstruction, unspecified as to partial versus complete obstruction: Secondary | ICD-10-CM | POA: Diagnosis present

## 2018-05-03 DIAGNOSIS — F1290 Cannabis use, unspecified, uncomplicated: Secondary | ICD-10-CM | POA: Diagnosis present

## 2018-05-03 DIAGNOSIS — K5651 Intestinal adhesions [bands], with partial obstruction: Principal | ICD-10-CM | POA: Diagnosis present

## 2018-05-03 DIAGNOSIS — Z23 Encounter for immunization: Secondary | ICD-10-CM

## 2018-05-03 DIAGNOSIS — Z720 Tobacco use: Secondary | ICD-10-CM | POA: Diagnosis present

## 2018-05-03 DIAGNOSIS — Z8249 Family history of ischemic heart disease and other diseases of the circulatory system: Secondary | ICD-10-CM

## 2018-05-03 DIAGNOSIS — Z9049 Acquired absence of other specified parts of digestive tract: Secondary | ICD-10-CM

## 2018-05-03 DIAGNOSIS — Z9079 Acquired absence of other genital organ(s): Secondary | ICD-10-CM

## 2018-05-03 DIAGNOSIS — Z79899 Other long term (current) drug therapy: Secondary | ICD-10-CM

## 2018-05-03 DIAGNOSIS — M1990 Unspecified osteoarthritis, unspecified site: Secondary | ICD-10-CM | POA: Diagnosis present

## 2018-05-03 DIAGNOSIS — Z8261 Family history of arthritis: Secondary | ICD-10-CM

## 2018-05-03 DIAGNOSIS — F17200 Nicotine dependence, unspecified, uncomplicated: Secondary | ICD-10-CM | POA: Diagnosis present

## 2018-05-03 DIAGNOSIS — Z905 Acquired absence of kidney: Secondary | ICD-10-CM

## 2018-05-03 DIAGNOSIS — B351 Tinea unguium: Secondary | ICD-10-CM | POA: Diagnosis present

## 2018-05-03 DIAGNOSIS — Z886 Allergy status to analgesic agent status: Secondary | ICD-10-CM

## 2018-05-03 DIAGNOSIS — E860 Dehydration: Secondary | ICD-10-CM

## 2018-05-03 DIAGNOSIS — Z87828 Personal history of other (healed) physical injury and trauma: Secondary | ICD-10-CM

## 2018-05-03 DIAGNOSIS — Z0189 Encounter for other specified special examinations: Secondary | ICD-10-CM

## 2018-05-03 DIAGNOSIS — Z833 Family history of diabetes mellitus: Secondary | ICD-10-CM

## 2018-05-03 DIAGNOSIS — L989 Disorder of the skin and subcutaneous tissue, unspecified: Secondary | ICD-10-CM | POA: Diagnosis present

## 2018-05-03 DIAGNOSIS — J449 Chronic obstructive pulmonary disease, unspecified: Secondary | ICD-10-CM | POA: Diagnosis present

## 2018-05-03 DIAGNOSIS — Z880 Allergy status to penicillin: Secondary | ICD-10-CM

## 2018-05-03 DIAGNOSIS — F1010 Alcohol abuse, uncomplicated: Secondary | ICD-10-CM | POA: Diagnosis present

## 2018-05-03 DIAGNOSIS — G40909 Epilepsy, unspecified, not intractable, without status epilepticus: Secondary | ICD-10-CM

## 2018-05-03 DIAGNOSIS — F1910 Other psychoactive substance abuse, uncomplicated: Secondary | ICD-10-CM | POA: Diagnosis present

## 2018-05-03 HISTORY — DX: Pneumonia, unspecified organism: J18.9

## 2018-05-03 HISTORY — DX: Impacted teeth: K01.1

## 2018-05-03 HISTORY — DX: Fracture of tooth (traumatic), initial encounter for closed fracture: S02.5XXA

## 2018-05-03 HISTORY — DX: Unspecified fracture of facial bones, initial encounter for closed fracture: S02.92XA

## 2018-05-03 LAB — COMPREHENSIVE METABOLIC PANEL
ALT: 119 U/L — ABNORMAL HIGH (ref 0–44)
ANION GAP: 10 (ref 5–15)
AST: 97 U/L — ABNORMAL HIGH (ref 15–41)
Albumin: 4 g/dL (ref 3.5–5.0)
Alkaline Phosphatase: 84 U/L (ref 38–126)
BUN: 12 mg/dL (ref 6–20)
CHLORIDE: 105 mmol/L (ref 98–111)
CO2: 30 mmol/L (ref 22–32)
Calcium: 9.2 mg/dL (ref 8.9–10.3)
Creatinine, Ser: 1.33 mg/dL — ABNORMAL HIGH (ref 0.61–1.24)
GFR calc non Af Amer: >60 mL/min (ref >60)
Glucose, Bld: 121 mg/dL — ABNORMAL HIGH (ref 70–99)
POTASSIUM: 6.1 mmol/L — AB (ref 3.5–5.1)
SODIUM: 145 mmol/L (ref 135–145)
Total Bilirubin: 2.3 mg/dL — ABNORMAL HIGH (ref 0.3–1.2)
Total Protein: 6.7 g/dL (ref 6.5–8.1)

## 2018-05-03 LAB — URINALYSIS, ROUTINE W REFLEX MICROSCOPIC
BACTERIA UA: NONE SEEN
Glucose, UA: NEGATIVE mg/dL
Hgb urine dipstick: NEGATIVE
Ketones, ur: 5 mg/dL — AB
Leukocytes, UA: NEGATIVE
Nitrite: NEGATIVE
PH: 6 (ref 5.0–8.0)
Protein, ur: 100 mg/dL — AB
Specific Gravity, Urine: 1.038 — ABNORMAL HIGH (ref 1.005–1.030)

## 2018-05-03 LAB — LIPASE, BLOOD: LIPASE: 26 U/L (ref 11–51)

## 2018-05-03 LAB — CBC
HCT: 45.2 % (ref 39.0–52.0)
Hemoglobin: 14.8 g/dL (ref 13.0–17.0)
MCH: 34.3 pg — ABNORMAL HIGH (ref 26.0–34.0)
MCHC: 32.7 g/dL (ref 30.0–36.0)
MCV: 104.6 fL — AB (ref 78.0–100.0)
PLATELETS: 308 10*3/uL (ref 150–400)
RBC: 4.32 MIL/uL (ref 4.22–5.81)
RDW: 15.3 % (ref 11.5–15.5)
WBC: 5 10*3/uL (ref 4.0–10.5)

## 2018-05-03 LAB — POTASSIUM: POTASSIUM: 3.6 mmol/L (ref 3.5–5.1)

## 2018-05-03 MED ORDER — MORPHINE SULFATE (PF) 2 MG/ML IV SOLN
2.0000 mg | INTRAVENOUS | Status: DC | PRN
  Administered 2018-05-03 – 2018-05-05 (×13): 2 mg via INTRAVENOUS
  Filled 2018-05-03 (×13): qty 1

## 2018-05-03 MED ORDER — LORAZEPAM 2 MG/ML IJ SOLN: 0.0000 mg | Freq: Four times a day (QID) | INTRAMUSCULAR | Status: AC

## 2018-05-03 MED ORDER — ONDANSETRON HCL 4 MG/2ML IJ SOLN
4.0000 mg | Freq: Once | INTRAMUSCULAR | Status: AC
  Administered 2018-05-03: 4 mg via INTRAVENOUS
  Filled 2018-05-03: qty 2

## 2018-05-03 MED ORDER — HYDROCORTISONE 2.5 % RE CREA: 1.0000 "application " | TOPICAL_CREAM | Freq: Four times a day (QID) | RECTAL | Status: DC | PRN

## 2018-05-03 MED ORDER — ALBUTEROL SULFATE (2.5 MG/3ML) 0.083% IN NEBU: 2.5000 mg | INHALATION_SOLUTION | RESPIRATORY_TRACT | Status: DC | PRN

## 2018-05-03 MED ORDER — ADULT MULTIVITAMIN W/MINERALS CH
1.0000 | ORAL_TABLET | Freq: Every day | ORAL | Status: DC
  Filled 2018-05-03 (×2): qty 1

## 2018-05-03 MED ORDER — SODIUM CHLORIDE 0.9 % IV SOLN
INTRAVENOUS | Status: DC
  Administered 2018-05-03 – 2018-05-05 (×3): via INTRAVENOUS

## 2018-05-03 MED ORDER — NICOTINE 21 MG/24HR TD PT24
21.0000 mg | MEDICATED_PATCH | Freq: Every day | TRANSDERMAL | Status: DC
  Administered 2018-05-03 – 2018-05-05 (×3): 21 mg via TRANSDERMAL
  Filled 2018-05-03 (×3): qty 1

## 2018-05-03 MED ORDER — LACTATED RINGERS IV BOLUS: 1000.0000 mL | Freq: Three times a day (TID) | INTRAVENOUS | Status: DC | PRN

## 2018-05-03 MED ORDER — SODIUM CHLORIDE 0.9 % IV BOLUS
1000.0000 mL | Freq: Once | INTRAVENOUS | Status: AC
  Administered 2018-05-03: 1000 mL via INTRAVENOUS

## 2018-05-03 MED ORDER — LIP MEDEX EX OINT
1.0000 "application " | TOPICAL_OINTMENT | Freq: Two times a day (BID) | CUTANEOUS | Status: DC
  Administered 2018-05-03 – 2018-05-04 (×3): 1 via TOPICAL
  Filled 2018-05-03: qty 7

## 2018-05-03 MED ORDER — LORAZEPAM 2 MG/ML IJ SOLN: 0.0000 mg | Freq: Two times a day (BID) | INTRAMUSCULAR | Status: DC

## 2018-05-03 MED ORDER — PNEUMOCOCCAL VAC POLYVALENT 25 MCG/0.5ML IJ INJ
0.5000 mL | INJECTION | INTRAMUSCULAR | Status: AC
Start: 2018-05-04 — End: 2018-05-04
  Administered 2018-05-04: 0.5 mL via INTRAMUSCULAR
  Filled 2018-05-03: qty 0.5

## 2018-05-03 MED ORDER — IOPAMIDOL (ISOVUE-300) INJECTION 61%
INTRAVENOUS | Status: AC
  Filled 2018-05-03: qty 100

## 2018-05-03 MED ORDER — MENTHOL 3 MG MT LOZG
1.0000 | LOZENGE | OROMUCOSAL | Status: DC | PRN
  Filled 2018-05-03: qty 9

## 2018-05-03 MED ORDER — MAGIC MOUTHWASH
15.0000 mL | Freq: Four times a day (QID) | ORAL | Status: DC | PRN
  Filled 2018-05-03: qty 15

## 2018-05-03 MED ORDER — MORPHINE SULFATE (PF) 4 MG/ML IV SOLN
4.0000 mg | Freq: Once | INTRAVENOUS | Status: AC
  Administered 2018-05-03: 4 mg via INTRAVENOUS
  Filled 2018-05-03: qty 1

## 2018-05-03 MED ORDER — BISACODYL 10 MG RE SUPP
10.0000 mg | Freq: Every day | RECTAL | Status: DC
  Administered 2018-05-03: 10 mg via RECTAL
  Filled 2018-05-03 (×2): qty 1

## 2018-05-03 MED ORDER — LORAZEPAM 1 MG PO TABS: 1.0000 mg | ORAL_TABLET | Freq: Four times a day (QID) | ORAL | Status: DC | PRN

## 2018-05-03 MED ORDER — ACETAMINOPHEN 650 MG RE SUPP: 650.0000 mg | Freq: Four times a day (QID) | RECTAL | Status: DC | PRN

## 2018-05-03 MED ORDER — DIATRIZOATE MEGLUMINE & SODIUM 66-10 % PO SOLN
90.0000 mL | Freq: Once | ORAL | Status: AC
  Administered 2018-05-03: 90 mL via NASOGASTRIC
  Filled 2018-05-03: qty 90

## 2018-05-03 MED ORDER — SODIUM CHLORIDE 0.9 % IV SOLN
8.0000 mg | Freq: Four times a day (QID) | INTRAVENOUS | Status: DC | PRN
  Filled 2018-05-03: qty 4

## 2018-05-03 MED ORDER — VITAMIN B-1 100 MG PO TABS
100.0000 mg | ORAL_TABLET | Freq: Every day | ORAL | Status: DC
  Administered 2018-05-05: 100 mg via ORAL
  Filled 2018-05-03: qty 1

## 2018-05-03 MED ORDER — IOPAMIDOL (ISOVUE-300) INJECTION 61%
100.0000 mL | Freq: Once | INTRAVENOUS | Status: AC | PRN
  Administered 2018-05-03: 100 mL via INTRAVENOUS

## 2018-05-03 MED ORDER — ONDANSETRON HCL 4 MG/2ML IJ SOLN: 4.0000 mg | Freq: Four times a day (QID) | INTRAMUSCULAR | Status: DC | PRN

## 2018-05-03 MED ORDER — PHENOL 1.4 % MT LIQD: 1.0000 | OROMUCOSAL | Status: DC | PRN

## 2018-05-03 MED ORDER — LEVETIRACETAM IN NACL 1000 MG/100ML IV SOLN
1000.0000 mg | Freq: Two times a day (BID) | INTRAVENOUS | Status: DC
  Administered 2018-05-03 – 2018-05-05 (×4): 1000 mg via INTRAVENOUS
  Filled 2018-05-03 (×4): qty 100

## 2018-05-03 MED ORDER — PROCHLORPERAZINE EDISYLATE 10 MG/2ML IJ SOLN: 5.0000 mg | INTRAMUSCULAR | Status: DC | PRN

## 2018-05-03 MED ORDER — FOLIC ACID 1 MG PO TABS
1.0000 mg | ORAL_TABLET | Freq: Every day | ORAL | Status: DC
  Administered 2018-05-05: 1 mg via ORAL
  Filled 2018-05-03 (×2): qty 1

## 2018-05-03 MED ORDER — THIAMINE HCL 100 MG/ML IJ SOLN
100.0000 mg | Freq: Every day | INTRAMUSCULAR | Status: DC
  Administered 2018-05-03 – 2018-05-04 (×2): 100 mg via INTRAVENOUS
  Filled 2018-05-03 (×2): qty 2

## 2018-05-03 MED ORDER — ONDANSETRON HCL 4 MG/2ML IJ SOLN
4.0000 mg | Freq: Four times a day (QID) | INTRAMUSCULAR | Status: DC | PRN
  Administered 2018-05-03: 4 mg via INTRAVENOUS
  Filled 2018-05-03 (×2): qty 2

## 2018-05-03 MED ORDER — GUAIFENESIN-DM 100-10 MG/5ML PO SYRP: 10.0000 mL | ORAL_SOLUTION | ORAL | Status: DC | PRN

## 2018-05-03 MED ORDER — LIDOCAINE HCL URETHRAL/MUCOSAL 2 % EX GEL
1.0000 | Freq: Once | CUTANEOUS | Status: AC
Start: 2018-05-03 — End: 2018-05-03
  Administered 2018-05-03: 1
  Filled 2018-05-03: qty 5

## 2018-05-03 MED ORDER — ACETAMINOPHEN 650 MG RE SUPP
650.0000 mg | Freq: Four times a day (QID) | RECTAL | Status: DC | PRN
Start: 2018-05-03 — End: 2018-05-05

## 2018-05-03 MED ORDER — LACTATED RINGERS IV BOLUS
1000.0000 mL | Freq: Once | INTRAVENOUS | Status: AC
  Administered 2018-05-03: 1000 mL via INTRAVENOUS

## 2018-05-03 MED ORDER — HYDROCORTISONE 1 % EX CREA
1.0000 "application " | TOPICAL_CREAM | Freq: Three times a day (TID) | CUTANEOUS | Status: DC | PRN
  Administered 2018-05-05: 1 via TOPICAL
  Filled 2018-05-03 (×3): qty 28

## 2018-05-03 MED ORDER — ACETAMINOPHEN 325 MG PO TABS
650.0000 mg | ORAL_TABLET | Freq: Four times a day (QID) | ORAL | Status: DC | PRN
Start: 2018-05-03 — End: 2018-05-05

## 2018-05-03 MED ORDER — ONDANSETRON HCL 4 MG PO TABS: 4.0000 mg | ORAL_TABLET | Freq: Four times a day (QID) | ORAL | Status: DC | PRN

## 2018-05-03 MED ORDER — METHOCARBAMOL 1000 MG/10ML IJ SOLN
1000.0000 mg | Freq: Four times a day (QID) | INTRAVENOUS | Status: DC | PRN
  Administered 2018-05-05: 1000 mg via INTRAVENOUS
  Filled 2018-05-03 (×2): qty 10

## 2018-05-03 MED ORDER — LORAZEPAM 2 MG/ML IJ SOLN: 1.0000 mg | Freq: Four times a day (QID) | INTRAMUSCULAR | Status: DC | PRN

## 2018-05-03 MED ORDER — ALUM & MAG HYDROXIDE-SIMETH 200-200-20 MG/5ML PO SUSP: 30.0000 mL | Freq: Four times a day (QID) | ORAL | Status: DC | PRN

## 2018-05-03 NOTE — ED Notes (Signed)
Patient transported to CT 

## 2018-05-03 NOTE — Progress Notes (Signed)
Patient arrived on unit via stretcher from ED.  Wife at bedside. Telemetry placed per MD order. CMT notified.
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
## 2018-05-03 NOTE — ED Notes (Signed)
Patient in CT. Will collect potassium when patient is back.
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
## 2018-05-03 NOTE — ED Triage Notes (Signed)
Patient here from home with complaints of abdominal pain, nausea, vomiting, constipation x2 days. Hx of same.

## 2018-05-03 NOTE — ED Notes (Signed)
Report given to Rebecca, RN

## 2018-05-03 NOTE — Plan of Care (Signed)
  Problem: Pain Managment: Goal: General experience of comfort will improve 05/03/2018 2027 by Antionette CharPeng, Trysten Bernard P, RN Outcome: Progressing 05/03/2018 2027 by Antionette CharPeng, Jakeim Sedore P, RN Outcome: Progressing

## 2018-05-03 NOTE — ED Notes (Signed)
Called DG to verify placement for NG tube .

## 2018-05-03 NOTE — ED Notes (Signed)
Inserted NG tube 20cm through nostril. DG completed.
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
## 2018-05-03 NOTE — ED Notes (Signed)
BLADDER SCAN RESULTED >17800mL's

## 2018-05-04 ENCOUNTER — Inpatient Hospital Stay (HOSPITAL_COMMUNITY): Payer: Self-pay

## 2018-05-04 LAB — BASIC METABOLIC PANEL
Anion gap: 6 (ref 5–15)
BUN: 10 mg/dL (ref 6–20)
CHLORIDE: 112 mmol/L — AB (ref 98–111)
CO2: 32 mmol/L (ref 22–32)
CREATININE: 1.2 mg/dL (ref 0.61–1.24)
Calcium: 8.5 mg/dL — ABNORMAL LOW (ref 8.9–10.3)
GFR calc Af Amer: >60 mL/min (ref >60)
GFR calc non Af Amer: >60 mL/min (ref >60)
Glucose, Bld: 92 mg/dL (ref 70–99)
Potassium: 4.1 mmol/L (ref 3.5–5.1)
SODIUM: 150 mmol/L — AB (ref 135–145)

## 2018-05-04 LAB — CBC
HCT: 41 % (ref 39.0–52.0)
Hemoglobin: 12.8 g/dL — ABNORMAL LOW (ref 13.0–17.0)
MCH: 34.1 pg — AB (ref 26.0–34.0)
MCHC: 31.2 g/dL (ref 30.0–36.0)
MCV: 109.3 fL — AB (ref 78.0–100.0)
PLATELETS: 251 10*3/uL (ref 150–400)
RBC: 3.75 MIL/uL — ABNORMAL LOW (ref 4.22–5.81)
RDW: 15.7 % — AB (ref 11.5–15.5)
WBC: 6.5 10*3/uL (ref 4.0–10.5)

## 2018-05-04 LAB — HIV ANTIBODY (ROUTINE TESTING W REFLEX): HIV Screen 4th Generation wRfx: NONREACTIVE
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
## 2018-05-04 NOTE — Progress Notes (Addendum)
Patient denies and nausea/vomiting since NG tube clamped this AM.  NG tube removed per MD order.  Patient tolerated well.

## 2018-05-05 DIAGNOSIS — G40909 Epilepsy, unspecified, not intractable, without status epilepticus: Secondary | ICD-10-CM

## 2018-05-05 DIAGNOSIS — Z720 Tobacco use: Secondary | ICD-10-CM

## 2018-05-05 DIAGNOSIS — F1910 Other psychoactive substance abuse, uncomplicated: Secondary | ICD-10-CM

## 2018-05-05 DIAGNOSIS — K56609 Unspecified intestinal obstruction, unspecified as to partial versus complete obstruction: Secondary | ICD-10-CM

## 2018-05-05 LAB — BASIC METABOLIC PANEL
ANION GAP: 7 (ref 5–15)
BUN: 8 mg/dL (ref 6–20)
CO2: 28 mmol/L (ref 22–32)
Calcium: 8.2 mg/dL — ABNORMAL LOW (ref 8.9–10.3)
Chloride: 108 mmol/L (ref 98–111)
Creatinine, Ser: 1.06 mg/dL (ref 0.61–1.24)
GFR calc Af Amer: >60 mL/min (ref >60)
GFR calc non Af Amer: >60 mL/min (ref >60)
GLUCOSE: 95 mg/dL (ref 70–99)
POTASSIUM: 3.3 mmol/L — AB (ref 3.5–5.1)
Sodium: 143 mmol/L (ref 135–145)

## 2018-05-05 LAB — CBC WITH DIFFERENTIAL/PLATELET
Basophils Absolute: 0 10*3/uL (ref 0.0–0.1)
Basophils Relative: 0 %
Eosinophils Absolute: 0.1 10*3/uL (ref 0.0–0.7)
Eosinophils Relative: 3 %
HEMATOCRIT: 38.8 % — AB (ref 39.0–52.0)
HEMOGLOBIN: 12 g/dL — AB (ref 13.0–17.0)
LYMPHS ABS: 1.5 10*3/uL (ref 0.7–4.0)
Lymphocytes Relative: 31 %
MCH: 33.7 pg (ref 26.0–34.0)
MCHC: 30.9 g/dL (ref 30.0–36.0)
MCV: 109 fL — AB (ref 78.0–100.0)
MONOS PCT: 11 %
Monocytes Absolute: 0.5 10*3/uL (ref 0.1–1.0)
NEUTROS ABS: 2.6 10*3/uL (ref 1.7–7.7)
NEUTROS PCT: 55 %
Platelets: 256 10*3/uL (ref 150–400)
RBC: 3.56 MIL/uL — ABNORMAL LOW (ref 4.22–5.81)
RDW: 15.2 % (ref 11.5–15.5)
WBC: 4.7 10*3/uL (ref 4.0–10.5)

## 2018-05-05 LAB — MAGNESIUM: Magnesium: 1.8 mg/dL (ref 1.7–2.4)

## 2018-05-05 MED ORDER — FLUCONAZOLE 100 MG PO TABS: 100.0000 mg | ORAL_TABLET | Freq: Every day | ORAL | 0 refills | Status: AC

## 2018-05-05 MED ORDER — AMMONIUM LACTATE 12 % EX LOTN
TOPICAL_LOTION | CUTANEOUS | Status: DC | PRN
  Filled 2018-05-05: qty 225

## 2018-05-05 MED ORDER — LEVETIRACETAM 1000 MG PO TABS: 1000.0000 mg | ORAL_TABLET | Freq: Two times a day (BID) | ORAL | 0 refills | Status: DC

## 2018-05-05 MED ORDER — HYDROCORTISONE 2.5 % RE CREA: 1.0000 "application " | TOPICAL_CREAM | Freq: Four times a day (QID) | RECTAL | 0 refills | Status: DC | PRN

## 2018-05-05 MED ORDER — POTASSIUM CHLORIDE CRYS ER 20 MEQ PO TBCR
40.0000 meq | EXTENDED_RELEASE_TABLET | Freq: Once | ORAL | Status: AC
  Administered 2018-05-05: 40 meq via ORAL
  Filled 2018-05-05: qty 2

## 2018-05-05 MED ORDER — FLUCONAZOLE 100 MG PO TABS: 100.0000 mg | ORAL_TABLET | Freq: Every day | ORAL | 0 refills | Status: DC

## 2018-05-05 MED ORDER — LEVETIRACETAM 500 MG PO TABS: 1000.0000 mg | ORAL_TABLET | Freq: Two times a day (BID) | ORAL | Status: DC

## 2018-05-05 MED ORDER — AMMONIUM LACTATE 12 % EX LOTN: TOPICAL_LOTION | CUTANEOUS | 0 refills | Status: DC | PRN

## 2018-05-05 MED ORDER — POLYETHYLENE GLYCOL 3350 17 G PO PACK: 17.0000 g | PACK | Freq: Every day | ORAL | 0 refills | Status: DC

## 2018-05-05 NOTE — Progress Notes (Signed)
Patient given discharge instructions, and verbalized an understanding of all discharge instructions.  Patient agrees with discharge plan, and is being discharged in stable medical condition.  Patient given transportation via wheelchair. 
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
## 2018-05-05 NOTE — Progress Notes (Signed)
PHARMACIST - PHYSICIAN COMMUNICATION  CONCERNING: IV to Oral Route Change Policy  RECOMMENDATION: This patient is receiving keppra by the intravenous route.  Based on criteria approved by the Pharmacy and Therapeutics Committee, the intravenous medication(s) is/are being converted to the equivalent oral dose form(s).   DESCRIPTION: These criteria include:  The patient is eating (either orally or via tube) and/or has been taking other orally administered medications for a least 24 hours  The patient has no evidence of active gastrointestinal bleeding or impaired GI absorption (gastrectomy, short bowel, patient on TNA or NPO).  If you have questions about this conversion, please contact the Pharmacy Department  []   [phone]( [phone] )  [facility] []   [phone]( [phone] )  [facility] []   [phone]( [phone] )  [facility] []   [phone]( [phone] )  [facility] [x]   [phone]( [phone] )  [facility]   Len ChildsBell, Rozell Kettlewell T, [facility] CenterLLCRPH 05/05/2018 11:14 AM

## 2018-05-05 NOTE — Progress Notes (Signed)
This shift pt has no complaint of nausea/ vomitting. Tolerated clears well overnight of jello, ice pop.  Possible advance to full diet in the a.m.
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
## 2018-09-12 ENCOUNTER — Ambulatory Visit (HOSPITAL_COMMUNITY)
Admission: EM | Admit: 2018-09-12 | Discharge: 2018-09-12 | Disposition: A | Discharge status: Home / Self Care | Payer: Self-pay | Attending: Family Medicine | Admitting: Family Medicine

## 2018-09-12 ENCOUNTER — Encounter (HOSPITAL_COMMUNITY): Payer: Self-pay | Admitting: Emergency Medicine

## 2018-09-12 DIAGNOSIS — Z8261 Family history of arthritis: Secondary | ICD-10-CM | POA: Insufficient documentation

## 2018-09-12 DIAGNOSIS — Z880 Allergy status to penicillin: Secondary | ICD-10-CM | POA: Insufficient documentation

## 2018-09-12 DIAGNOSIS — J449 Chronic obstructive pulmonary disease, unspecified: Secondary | ICD-10-CM | POA: Insufficient documentation

## 2018-09-12 DIAGNOSIS — Z833 Family history of diabetes mellitus: Secondary | ICD-10-CM | POA: Insufficient documentation

## 2018-09-12 DIAGNOSIS — Z8249 Family history of ischemic heart disease and other diseases of the circulatory system: Secondary | ICD-10-CM | POA: Insufficient documentation

## 2018-09-12 DIAGNOSIS — R369 Urethral discharge, unspecified: Secondary | ICD-10-CM

## 2018-09-12 DIAGNOSIS — G40909 Epilepsy, unspecified, not intractable, without status epilepticus: Secondary | ICD-10-CM | POA: Insufficient documentation

## 2018-09-12 DIAGNOSIS — F17200 Nicotine dependence, unspecified, uncomplicated: Secondary | ICD-10-CM | POA: Insufficient documentation

## 2018-09-12 DIAGNOSIS — Z113 Encounter for screening for infections with a predominantly sexual mode of transmission: Secondary | ICD-10-CM

## 2018-09-12 DIAGNOSIS — Z9049 Acquired absence of other specified parts of digestive tract: Secondary | ICD-10-CM | POA: Insufficient documentation

## 2018-09-12 DIAGNOSIS — Z886 Allergy status to analgesic agent status: Secondary | ICD-10-CM | POA: Insufficient documentation

## 2018-09-12 DIAGNOSIS — Z905 Acquired absence of kidney: Secondary | ICD-10-CM | POA: Insufficient documentation

## 2018-09-12 DIAGNOSIS — R300 Dysuria: Secondary | ICD-10-CM

## 2018-09-12 DIAGNOSIS — M1990 Unspecified osteoarthritis, unspecified site: Secondary | ICD-10-CM | POA: Insufficient documentation

## 2018-09-12 DIAGNOSIS — Z202 Contact with and (suspected) exposure to infections with a predominantly sexual mode of transmission: Secondary | ICD-10-CM | POA: Insufficient documentation

## 2018-09-12 DIAGNOSIS — Z79899 Other long term (current) drug therapy: Secondary | ICD-10-CM | POA: Insufficient documentation

## 2018-09-12 DIAGNOSIS — J4541 Moderate persistent asthma with (acute) exacerbation: Secondary | ICD-10-CM | POA: Insufficient documentation

## 2018-09-12 MED ORDER — ALBUTEROL SULFATE HFA 108 (90 BASE) MCG/ACT IN AERS: 1.0000 | INHALATION_SPRAY | Freq: Four times a day (QID) | RESPIRATORY_TRACT | 0 refills | Status: DC | PRN

## 2018-09-12 MED ORDER — AZITHROMYCIN 250 MG PO TABS
ORAL_TABLET | ORAL | Status: AC
  Filled 2018-09-12: qty 4

## 2018-09-12 MED ORDER — CEFTRIAXONE SODIUM 250 MG IJ SOLR
INTRAMUSCULAR | Status: AC
  Filled 2018-09-12: qty 250

## 2018-09-12 MED ORDER — PREDNISONE 20 MG PO TABS: 40.0000 mg | ORAL_TABLET | Freq: Every day | ORAL | 0 refills | Status: AC

## 2018-09-12 MED ORDER — AZITHROMYCIN 250 MG PO TABS
1000.0000 mg | ORAL_TABLET | Freq: Once | ORAL | Status: AC
  Administered 2018-09-12: 1000 mg via ORAL

## 2018-09-12 MED ORDER — CEFTRIAXONE SODIUM 250 MG IJ SOLR
250.0000 mg | Freq: Once | INTRAMUSCULAR | Status: AC
  Administered 2018-09-12: 250 mg via INTRAMUSCULAR

## 2018-09-12 NOTE — ED Notes (Signed)
Pt refused blood work for HIV and RPR

## 2018-09-12 NOTE — Discharge Instructions (Addendum)
Avoid sexual intercourse for at least 7 days. Use condoms with each sexual encounter. Schedule an appointment with new PCP office. Take asthma medications as directed.

## 2018-09-12 NOTE — ED Notes (Signed)
Declined blood work per [employer], phleb/rad. K.harris, np made aware.
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
## 2018-09-12 NOTE — ED Triage Notes (Signed)
Pt states hes noticed penile discharge and burning since last night.

## 2018-09-15 LAB — URINE CYTOLOGY ANCILLARY ONLY
CHLAMYDIA, DNA PROBE: POSITIVE — AB
NEISSERIA GONORRHEA: POSITIVE — AB
Trichomonas: NEGATIVE

## 2018-09-16 LAB — URINE CYTOLOGY ANCILLARY ONLY: CANDIDA VAGINITIS: NEGATIVE

## 2018-09-17 ENCOUNTER — Telehealth (HOSPITAL_COMMUNITY): Payer: Self-pay | Admitting: Emergency Medicine

## 2018-09-17 NOTE — Telephone Encounter (Signed)
Chlamydia is positive.  This was treated at the urgent care visit with po zithromax 1g.  Pt needs education to please refrain from sexual intercourse for 7 days to give the medicine time to work.  Sexual partners need to be notified and tested/treated.  Condoms may reduce risk of reinfection.  Recheck or followup with PCP for further evaluation if symptoms are not improving.  [facility] notified.  Test for gonorrhea was positive. This was treated at the urgent care visit with IM rocephin 250mg  and po zithromax 1g. Pt needs education to refrain from sexual intercourse for 7 days after treatment to give the medicine time to work. Sexual partners need to be notified and tested/treated. Condoms may reduce risk of reinfection. Recheck or followup with PCP for further evaluation if symptoms are not improving. [facility] notified.   Attempted to reach patient. Patient was not available. Left message to call back.

## 2018-09-20 ENCOUNTER — Telehealth (HOSPITAL_COMMUNITY): Payer: Self-pay | Admitting: Emergency Medicine

## 2018-09-20 NOTE — Telephone Encounter (Signed)
Attempted to reach patient x2. Patient not available. Left message with a woman who answered for him to call back.

## 2018-09-23 ENCOUNTER — Telehealth (HOSPITAL_COMMUNITY): Payer: Self-pay | Admitting: Emergency Medicine

## 2018-09-23 NOTE — Telephone Encounter (Signed)
Attempted to call pt x 3 with no answer; letter sent

## 2018-10-02 ENCOUNTER — Telehealth (HOSPITAL_COMMUNITY): Payer: Self-pay | Admitting: Emergency Medicine

## 2018-10-02 NOTE — Telephone Encounter (Signed)
Pt called asking about test results. All questions answered.

## 2018-10-06 ENCOUNTER — Emergency Department (HOSPITAL_COMMUNITY): Payer: Self-pay

## 2018-10-06 ENCOUNTER — Other Ambulatory Visit: Payer: Self-pay

## 2018-10-06 ENCOUNTER — Inpatient Hospital Stay (HOSPITAL_COMMUNITY)
Admission: EM | Admit: 2018-10-06 | Discharge: 2018-10-08 | DRG: 101 | Disposition: A | Discharge status: Home Health | Payer: Self-pay | Attending: Internal Medicine | Admitting: Internal Medicine

## 2018-10-06 ENCOUNTER — Encounter (HOSPITAL_COMMUNITY): Payer: Self-pay | Admitting: Emergency Medicine

## 2018-10-06 DIAGNOSIS — Z880 Allergy status to penicillin: Secondary | ICD-10-CM

## 2018-10-06 DIAGNOSIS — J449 Chronic obstructive pulmonary disease, unspecified: Secondary | ICD-10-CM | POA: Diagnosis present

## 2018-10-06 DIAGNOSIS — R569 Unspecified convulsions: Secondary | ICD-10-CM

## 2018-10-06 DIAGNOSIS — Z833 Family history of diabetes mellitus: Secondary | ICD-10-CM

## 2018-10-06 DIAGNOSIS — R4189 Other symptoms and signs involving cognitive functions and awareness: Secondary | ICD-10-CM

## 2018-10-06 DIAGNOSIS — Z9049 Acquired absence of other specified parts of digestive tract: Secondary | ICD-10-CM

## 2018-10-06 DIAGNOSIS — Z8249 Family history of ischemic heart disease and other diseases of the circulatory system: Secondary | ICD-10-CM

## 2018-10-06 DIAGNOSIS — M4802 Spinal stenosis, cervical region: Secondary | ICD-10-CM | POA: Diagnosis present

## 2018-10-06 DIAGNOSIS — Z791 Long term (current) use of non-steroidal anti-inflammatories (NSAID): Secondary | ICD-10-CM

## 2018-10-06 DIAGNOSIS — M25473 Effusion, unspecified ankle: Secondary | ICD-10-CM

## 2018-10-06 DIAGNOSIS — I4510 Unspecified right bundle-branch block: Secondary | ICD-10-CM

## 2018-10-06 DIAGNOSIS — F17210 Nicotine dependence, cigarettes, uncomplicated: Secondary | ICD-10-CM | POA: Diagnosis present

## 2018-10-06 DIAGNOSIS — G40909 Epilepsy, unspecified, not intractable, without status epilepticus: Principal | ICD-10-CM | POA: Diagnosis present

## 2018-10-06 DIAGNOSIS — M2141 Flat foot [pes planus] (acquired), right foot: Secondary | ICD-10-CM | POA: Diagnosis present

## 2018-10-06 DIAGNOSIS — R4182 Altered mental status, unspecified: Secondary | ICD-10-CM

## 2018-10-06 DIAGNOSIS — Z79899 Other long term (current) drug therapy: Secondary | ICD-10-CM

## 2018-10-06 DIAGNOSIS — M6282 Rhabdomyolysis: Secondary | ICD-10-CM | POA: Diagnosis present

## 2018-10-06 DIAGNOSIS — M19071 Primary osteoarthritis, right ankle and foot: Secondary | ICD-10-CM | POA: Diagnosis present

## 2018-10-06 DIAGNOSIS — Z7289 Other problems related to lifestyle: Secondary | ICD-10-CM

## 2018-10-06 DIAGNOSIS — F17290 Nicotine dependence, other tobacco product, uncomplicated: Secondary | ICD-10-CM | POA: Diagnosis present

## 2018-10-06 DIAGNOSIS — F1490 Cocaine use, unspecified, uncomplicated: Secondary | ICD-10-CM | POA: Diagnosis present

## 2018-10-06 DIAGNOSIS — S06890S Other specified intracranial injury without loss of consciousness, sequela: Secondary | ICD-10-CM

## 2018-10-06 DIAGNOSIS — R40243 Glasgow coma scale score 3-8, unspecified time: Secondary | ICD-10-CM | POA: Diagnosis present

## 2018-10-06 DIAGNOSIS — Z886 Allergy status to analgesic agent status: Secondary | ICD-10-CM

## 2018-10-06 DIAGNOSIS — Z9114 Patient's other noncompliance with medication regimen: Secondary | ICD-10-CM

## 2018-10-06 DIAGNOSIS — D7589 Other specified diseases of blood and blood-forming organs: Secondary | ICD-10-CM | POA: Diagnosis present

## 2018-10-06 DIAGNOSIS — G629 Polyneuropathy, unspecified: Secondary | ICD-10-CM | POA: Diagnosis present

## 2018-10-06 DIAGNOSIS — M549 Dorsalgia, unspecified: Secondary | ICD-10-CM | POA: Diagnosis present

## 2018-10-06 DIAGNOSIS — W3400XS Accidental discharge from unspecified firearms or gun, sequela: Secondary | ICD-10-CM

## 2018-10-06 DIAGNOSIS — Z91018 Allergy to other foods: Secondary | ICD-10-CM

## 2018-10-06 DIAGNOSIS — N179 Acute kidney failure, unspecified: Secondary | ICD-10-CM

## 2018-10-06 DIAGNOSIS — M79671 Pain in right foot: Secondary | ICD-10-CM

## 2018-10-06 LAB — COMPREHENSIVE METABOLIC PANEL
ALT: 25 U/L (ref 0–44)
AST: 47 U/L — ABNORMAL HIGH (ref 15–41)
Albumin: 4 g/dL (ref 3.5–5.0)
Alkaline Phosphatase: 68 U/L (ref 38–126)
Anion gap: 15 (ref 5–15)
BILIRUBIN TOTAL: 1.1 mg/dL (ref 0.3–1.2)
BUN: 16 mg/dL (ref 6–20)
CO2: 23 mmol/L (ref 22–32)
Calcium: 9 mg/dL (ref 8.9–10.3)
Chloride: 98 mmol/L (ref 98–111)
Creatinine, Ser: 1.37 mg/dL — ABNORMAL HIGH (ref 0.61–1.24)
GFR calc Af Amer: >60 mL/min (ref >60)
GFR, EST NON AFRICAN AMERICAN: 59 mL/min — AB (ref >60)
Glucose, Bld: 88 mg/dL (ref 70–99)
Potassium: 3.9 mmol/L (ref 3.5–5.1)
Sodium: 136 mmol/L (ref 135–145)
TOTAL PROTEIN: 7 g/dL (ref 6.5–8.1)

## 2018-10-06 LAB — CBC WITH DIFFERENTIAL/PLATELET
Abs Immature Granulocytes: 0.04 10*3/uL (ref 0.00–0.07)
BASOS PCT: 0 %
Basophils Absolute: 0 10*3/uL (ref 0.0–0.1)
EOS ABS: 0.1 10*3/uL (ref 0.0–0.5)
EOS PCT: 2 %
HCT: 41 % (ref 39.0–52.0)
Hemoglobin: 13.3 g/dL (ref 13.0–17.0)
Immature Granulocytes: 1 %
Lymphocytes Relative: 28 %
Lymphs Abs: 1.7 10*3/uL (ref 0.7–4.0)
MCH: 33.2 pg (ref 26.0–34.0)
MCHC: 32.4 g/dL (ref 30.0–36.0)
MCV: 102.2 fL — AB (ref 80.0–100.0)
MONO ABS: 0.6 10*3/uL (ref 0.1–1.0)
MONOS PCT: 10 %
Neutro Abs: 3.6 10*3/uL (ref 1.7–7.7)
Neutrophils Relative %: 59 %
PLATELETS: 192 10*3/uL (ref 150–400)
RBC: 4.01 MIL/uL — AB (ref 4.22–5.81)
RDW: 13.3 % (ref 11.5–15.5)
WBC: 6.1 10*3/uL (ref 4.0–10.5)
nRBC: 0 % (ref 0.0–0.2)

## 2018-10-06 LAB — ETHANOL: Alcohol, Ethyl (B): <10 mg/dL (ref <10)

## 2018-10-06 LAB — I-STAT ARTERIAL BLOOD GAS, ED
BICARBONATE: 26.5 mmol/L (ref 20.0–28.0)
O2 Saturation: 91 %
PH ART: 7.352 (ref 7.350–7.450)
Patient temperature: 98.6
TCO2: 28 mmol/L (ref 22–32)
pCO2 arterial: 47.9 mmHg (ref 32.0–48.0)
pO2, Arterial: 65 mmHg — ABNORMAL LOW (ref 83.0–108.0)

## 2018-10-06 LAB — URINALYSIS, ROUTINE W REFLEX MICROSCOPIC
Bacteria, UA: NONE SEEN
Bilirubin Urine: NEGATIVE
Glucose, UA: NEGATIVE mg/dL
Ketones, ur: 20 mg/dL — AB
LEUKOCYTES UA: NEGATIVE
Nitrite: NEGATIVE
Protein, ur: NEGATIVE mg/dL
Specific Gravity, Urine: 1.017 (ref 1.005–1.030)
pH: 6 (ref 5.0–8.0)

## 2018-10-06 LAB — RAPID URINE DRUG SCREEN, HOSP PERFORMED
Amphetamines: NOT DETECTED
Barbiturates: NOT DETECTED
Benzodiazepines: POSITIVE — AB
Cocaine: POSITIVE — AB
OPIATES: NOT DETECTED
Tetrahydrocannabinol: NOT DETECTED

## 2018-10-06 LAB — MAGNESIUM: Magnesium: 1.9 mg/dL (ref 1.7–2.4)

## 2018-10-06 LAB — ACETAMINOPHEN LEVEL: Acetaminophen (Tylenol), Serum: <10 ug/mL — ABNORMAL LOW (ref 10–30)

## 2018-10-06 LAB — SALICYLATE LEVEL

## 2018-10-06 LAB — CK: Total CK: 1025 U/L — ABNORMAL HIGH (ref 49–397)

## 2018-10-06 LAB — I-STAT CG4 LACTIC ACID, ED: LACTIC ACID, VENOUS: 1.23 mmol/L (ref 0.5–1.9)

## 2018-10-06 MED ORDER — PROMETHAZINE HCL 25 MG/ML IJ SOLN: 12.5000 mg | Freq: Four times a day (QID) | INTRAMUSCULAR | Status: DC | PRN

## 2018-10-06 MED ORDER — LIDOCAINE 4 % EX CREA
TOPICAL_CREAM | Freq: Every day | CUTANEOUS | Status: DC
  Administered 2018-10-07 – 2018-10-08 (×2): 1 via TOPICAL
  Filled 2018-10-06 (×2): qty 5

## 2018-10-06 MED ORDER — THIAMINE HCL 100 MG/ML IJ SOLN
100.0000 mg | Freq: Every day | INTRAMUSCULAR | Status: DC
  Filled 2018-10-06: qty 2

## 2018-10-06 MED ORDER — LEVETIRACETAM 500 MG PO TABS
1000.0000 mg | ORAL_TABLET | Freq: Two times a day (BID) | ORAL | Status: DC
  Administered 2018-10-07 – 2018-10-08 (×3): 1000 mg via ORAL
  Filled 2018-10-06 (×3): qty 2

## 2018-10-06 MED ORDER — FOLIC ACID 1 MG PO TABS
1.0000 mg | ORAL_TABLET | Freq: Every day | ORAL | Status: DC
  Administered 2018-10-06 – 2018-10-08 (×3): 1 mg via ORAL
  Filled 2018-10-06 (×3): qty 1

## 2018-10-06 MED ORDER — VITAMIN B-1 100 MG PO TABS
100.0000 mg | ORAL_TABLET | Freq: Every day | ORAL | Status: DC
  Administered 2018-10-06 – 2018-10-08 (×3): 100 mg via ORAL
  Filled 2018-10-06 (×3): qty 1

## 2018-10-06 MED ORDER — LEVETIRACETAM IN NACL 1000 MG/100ML IV SOLN
1000.0000 mg | Freq: Once | INTRAVENOUS | Status: AC
  Administered 2018-10-06: 1000 mg via INTRAVENOUS
  Filled 2018-10-06: qty 100

## 2018-10-06 MED ORDER — ACETAMINOPHEN 500 MG PO TABS
1000.0000 mg | ORAL_TABLET | Freq: Four times a day (QID) | ORAL | Status: DC | PRN
  Administered 2018-10-06 – 2018-10-08 (×5): 1000 mg via ORAL
  Filled 2018-10-06 (×6): qty 2

## 2018-10-06 MED ORDER — SODIUM CHLORIDE 0.9 % IV SOLN
INTRAVENOUS | Status: AC
  Administered 2018-10-06: 09:00:00 via INTRAVENOUS

## 2018-10-06 MED ORDER — ADULT MULTIVITAMIN W/MINERALS CH
1.0000 | ORAL_TABLET | Freq: Every day | ORAL | Status: DC
  Administered 2018-10-06 – 2018-10-08 (×3): 1 via ORAL
  Filled 2018-10-06 (×3): qty 1

## 2018-10-06 NOTE — ED Provider Notes (Signed)
Patient still not waking up appropriately.  Have contacted internal medicine teaching service who is on-call for unassigned medicine who will admit the patient.   David Benson, David Pilar, MD 10/06/18 [phone]
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
## 2018-10-06 NOTE — ED Notes (Signed)
Pt placed on 02 by Rafael Hernandez at 2L
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
## 2018-10-06 NOTE — Progress Notes (Signed)
Patient received from ED via a bed; patient is alert and cooperative; oriented to room and unit routine; wife at bedside.
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
## 2018-10-07 ENCOUNTER — Inpatient Hospital Stay (HOSPITAL_COMMUNITY): Payer: Self-pay

## 2018-10-07 ENCOUNTER — Observation Stay (HOSPITAL_COMMUNITY): Payer: Self-pay

## 2018-10-07 DIAGNOSIS — Z9114 Patient's other noncompliance with medication regimen: Secondary | ICD-10-CM

## 2018-10-07 DIAGNOSIS — M542 Cervicalgia: Secondary | ICD-10-CM

## 2018-10-07 DIAGNOSIS — G8194 Hemiplegia, unspecified affecting left nondominant side: Secondary | ICD-10-CM

## 2018-10-07 DIAGNOSIS — M6282 Rhabdomyolysis: Secondary | ICD-10-CM

## 2018-10-07 DIAGNOSIS — Z87311 Personal history of (healed) other pathological fracture: Secondary | ICD-10-CM

## 2018-10-07 DIAGNOSIS — M79671 Pain in right foot: Secondary | ICD-10-CM

## 2018-10-07 DIAGNOSIS — M19071 Primary osteoarthritis, right ankle and foot: Secondary | ICD-10-CM

## 2018-10-07 DIAGNOSIS — Z87820 Personal history of traumatic brain injury: Secondary | ICD-10-CM

## 2018-10-07 LAB — COMPREHENSIVE METABOLIC PANEL
ALT: 21 U/L (ref 0–44)
AST: 34 U/L (ref 15–41)
Albumin: 3.3 g/dL — ABNORMAL LOW (ref 3.5–5.0)
Alkaline Phosphatase: 58 U/L (ref 38–126)
Anion gap: 12 (ref 5–15)
BUN: 18 mg/dL (ref 6–20)
CO2: 25 mmol/L (ref 22–32)
Calcium: 8.6 mg/dL — ABNORMAL LOW (ref 8.9–10.3)
Chloride: 102 mmol/L (ref 98–111)
Creatinine, Ser: 1.27 mg/dL — ABNORMAL HIGH (ref 0.61–1.24)
GFR calc Af Amer: >60 mL/min (ref >60)
GFR calc non Af Amer: >60 mL/min (ref >60)
Glucose, Bld: 100 mg/dL — ABNORMAL HIGH (ref 70–99)
Potassium: 4.3 mmol/L (ref 3.5–5.1)
Sodium: 139 mmol/L (ref 135–145)
Total Bilirubin: 0.5 mg/dL (ref 0.3–1.2)
Total Protein: 5.9 g/dL — ABNORMAL LOW (ref 6.5–8.1)

## 2018-10-07 LAB — CK: Total CK: 571 U/L — ABNORMAL HIGH (ref 49–397)

## 2018-10-07 MED ORDER — SODIUM CHLORIDE 0.9 % IV SOLN
INTRAVENOUS | Status: AC
  Administered 2018-10-07: 12:00:00 via INTRAVENOUS

## 2018-10-07 MED ORDER — CYCLOBENZAPRINE HCL 10 MG PO TABS
5.0000 mg | ORAL_TABLET | Freq: Three times a day (TID) | ORAL | Status: DC
  Administered 2018-10-07 – 2018-10-08 (×4): 5 mg via ORAL
  Filled 2018-10-07 (×4): qty 1

## 2018-10-07 NOTE — Care Management Note (Signed)
Case Management Note  Patient Details  Name: David Benson MRN: [id number] Date of Birth: [birth year]  Subjective/Objective:     Pt admitted with post ictal state. He is from home with his spouse. DME: none Pt is without PCP and insurance.  Pt states he has to rely on others for transportation and if they can take him at the time he has a need.                Action/Plan: CM met with patient to see if he would be interested in one of the [facility] and use of the [facility] pharmacy. CM has left message with [facility] to see if able to get him in as a new pt. CM following.  Expected Discharge Date:                  Expected Discharge Plan:  Home/Self Care  In-House Referral:     Discharge planning Services  CM Consult, [facility], Medication Assistance  Post Acute Care Choice:    Choice offered to:     DME Arranged:    DME Agency:     HH Arranged:    HH Agency:     Status of Service:  In process, will continue to follow  If discussed at Long Length of Stay Meetings, dates discussed:    Additional Comments:  Pollie Friar, RN 10/07/2018, 2:22 PM
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
## 2018-10-08 DIAGNOSIS — D7589 Other specified diseases of blood and blood-forming organs: Secondary | ICD-10-CM

## 2018-10-08 DIAGNOSIS — G629 Polyneuropathy, unspecified: Secondary | ICD-10-CM

## 2018-10-08 DIAGNOSIS — Z79899 Other long term (current) drug therapy: Secondary | ICD-10-CM

## 2018-10-08 LAB — BASIC METABOLIC PANEL
Anion gap: 8 (ref 5–15)
BUN: 11 mg/dL (ref 6–20)
CHLORIDE: 105 mmol/L (ref 98–111)
CO2: 26 mmol/L (ref 22–32)
CREATININE: 1.05 mg/dL (ref 0.61–1.24)
Calcium: 8.9 mg/dL (ref 8.9–10.3)
GFR calc Af Amer: >60 mL/min (ref >60)
GFR calc non Af Amer: >60 mL/min (ref >60)
Glucose, Bld: 103 mg/dL — ABNORMAL HIGH (ref 70–99)
Potassium: 4.5 mmol/L (ref 3.5–5.1)
Sodium: 139 mmol/L (ref 135–145)

## 2018-10-08 LAB — CK: Total CK: 299 U/L (ref 49–397)

## 2018-10-08 MED ORDER — IPRATROPIUM-ALBUTEROL 0.5-2.5 (3) MG/3ML IN SOLN: 3.0000 mL | Freq: Four times a day (QID) | RESPIRATORY_TRACT | Status: DC | PRN

## 2018-10-08 MED ORDER — CYCLOBENZAPRINE HCL 5 MG PO TABS: 5.0000 mg | ORAL_TABLET | Freq: Three times a day (TID) | ORAL | 0 refills | Status: DC

## 2018-10-08 MED ORDER — IPRATROPIUM-ALBUTEROL 0.5-2.5 (3) MG/3ML IN SOLN: 3.0000 mL | Freq: Four times a day (QID) | RESPIRATORY_TRACT | Status: DC

## 2018-10-08 MED ORDER — TRAMADOL HCL 50 MG PO TABS
50.0000 mg | ORAL_TABLET | Freq: Two times a day (BID) | ORAL | Status: DC
  Administered 2018-10-08: 50 mg via ORAL
  Filled 2018-10-08: qty 1

## 2018-10-08 MED ORDER — TRAMADOL HCL 50 MG PO TABS: 50.0000 mg | ORAL_TABLET | Freq: Two times a day (BID) | ORAL | 0 refills | Status: DC

## 2018-10-08 MED ORDER — LEVETIRACETAM 1000 MG PO TABS: 1000.0000 mg | ORAL_TABLET | Freq: Two times a day (BID) | ORAL | 0 refills | Status: DC

## 2018-10-08 NOTE — Progress Notes (Signed)
Patient is discharging home. Discharge paperwork went over with patient and mother. All questions and concerns addressed. All belongings sent with patient. Patient taken down in wheelchair. David RussellKelsey [name]

## 2018-10-08 NOTE — Care Management Note (Addendum)
Case Management Note  Patient Details  Name: David Benson MRN: [id number] Date of Birth: 1965-10-31  Subjective/Objective:                    Action/Plan: Pt to d/c home today. [facility] which [facility]. [facility] at [facility]. CM can have security transport [facility] and then a cab can transport him home. He will be staying at his mothers home which is the address in the system.  Pt with orders for [facility] services. Pt is without insurance. CM notified Lupita LeashDonna with [facility] and they will see if he qualifies for charity services.  Pt with orders for walker. James with [facility] notified and will deliver to the room. Pt will need MD to sign HH orders until [facility] is [facility]. Dr Donnetta HutchingMullen's group has agreed.   Addendum: (12:25): pt states now he needs to go to his house prior to picking up his medications. Cab voucher to his home provided to bedside RN. Pt aware he needs to get meds today and that [facility] closes at 5 pm.   Expected Discharge Date:                  Expected Discharge Plan:  Home w Home Health Services  In-House Referral:     Discharge planning Services  CM Consult, Indigent Health Clinic, Medication Assistance  Post Acute Care Choice:  Home Health, Durable Medical Equipment Choice offered to:  Patient  DME Arranged:  Walker rolling DME Agency:  [facility]  HH Arranged:  PT, Social Work [facility] Agency:  [facility](charity [facility])  Status of Service:  Completed, signed off  If discussed at [employer] Meetings, dates discussed:    Additional Comments:  Kermit BaloKelli F Julea Hutto, RN 10/08/2018, 11:15 AM
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
## 2018-11-04 ENCOUNTER — Inpatient Hospital Stay (INDEPENDENT_AMBULATORY_CARE_PROVIDER_SITE_OTHER): Payer: Self-pay | Admitting: Family Medicine

## 2019-02-06 ENCOUNTER — Ambulatory Visit (HOSPITAL_COMMUNITY)
Admission: EM | Admit: 2019-02-06 | Discharge: 2019-02-06 | Disposition: A | Discharge status: Home / Self Care | Payer: Self-pay | Attending: Family Medicine | Admitting: Family Medicine

## 2019-02-06 ENCOUNTER — Ambulatory Visit (INDEPENDENT_AMBULATORY_CARE_PROVIDER_SITE_OTHER): Payer: Self-pay

## 2019-02-06 ENCOUNTER — Encounter (HOSPITAL_COMMUNITY): Payer: Self-pay

## 2019-02-06 DIAGNOSIS — M79671 Pain in right foot: Secondary | ICD-10-CM

## 2019-02-06 MED ORDER — IBUPROFEN 600 MG PO TABS: 600.0000 mg | ORAL_TABLET | Freq: Four times a day (QID) | ORAL | 0 refills | Status: DC | PRN
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
## 2019-02-06 NOTE — ED Notes (Signed)
Patient verbalizes understanding of discharge instructions. Opportunity for questioning and answers were provided. Patient discharged from [facility] by provider.  

## 2019-02-06 NOTE — Discharge Instructions (Signed)
X-ray showed arthritis in your foot as well as minimal arch Your symptoms may be from planter fascititis as well  Use anti-inflammatories for pain/swelling. You may take up to 600 mg Ibuprofen every 8 hours with food. You may supplement Ibuprofen with Tylenol [phone] mg every 8 hours.   Ice and elevate when at home resting  In the morning before getting up please try rolling the bottom of your foot on a golf ball as well as stretching, see attached information  Please also try obtaining inserts for your shoes  Please follow-up with podiatry, contact below, if symptoms persisting with consistent use of the above over the next 2 weeks or worsening

## 2019-02-06 NOTE — ED Triage Notes (Signed)
Pt c/o pain to the bottom heel of rt foot for over a month. Denies injury

## 2019-02-14 ENCOUNTER — Emergency Department (HOSPITAL_COMMUNITY): Payer: Self-pay

## 2019-02-14 ENCOUNTER — Emergency Department (HOSPITAL_COMMUNITY)
Admission: EM | Admit: 2019-02-14 | Discharge: 2019-02-14 | Disposition: A | Discharge status: Home / Self Care | Payer: Self-pay | Attending: Emergency Medicine | Admitting: Emergency Medicine

## 2019-02-14 ENCOUNTER — Encounter (HOSPITAL_COMMUNITY): Payer: Self-pay | Admitting: Emergency Medicine

## 2019-02-14 ENCOUNTER — Other Ambulatory Visit: Payer: Self-pay

## 2019-02-14 DIAGNOSIS — J45909 Unspecified asthma, uncomplicated: Secondary | ICD-10-CM | POA: Insufficient documentation

## 2019-02-14 DIAGNOSIS — M542 Cervicalgia: Secondary | ICD-10-CM | POA: Insufficient documentation

## 2019-02-14 DIAGNOSIS — J449 Chronic obstructive pulmonary disease, unspecified: Secondary | ICD-10-CM | POA: Insufficient documentation

## 2019-02-14 DIAGNOSIS — J189 Pneumonia, unspecified organism: Secondary | ICD-10-CM | POA: Insufficient documentation

## 2019-02-14 DIAGNOSIS — F17200 Nicotine dependence, unspecified, uncomplicated: Secondary | ICD-10-CM | POA: Insufficient documentation

## 2019-02-14 DIAGNOSIS — R059 Cough, unspecified: Secondary | ICD-10-CM

## 2019-02-14 DIAGNOSIS — R05 Cough: Secondary | ICD-10-CM

## 2019-02-14 LAB — COMPREHENSIVE METABOLIC PANEL
ALT: 24 U/L (ref 0–44)
AST: 26 U/L (ref 15–41)
Albumin: 3.2 g/dL — ABNORMAL LOW (ref 3.5–5.0)
Alkaline Phosphatase: 59 U/L (ref 38–126)
Anion gap: 10 (ref 5–15)
BUN: 10 mg/dL (ref 6–20)
CO2: 25 mmol/L (ref 22–32)
Calcium: 9.1 mg/dL (ref 8.9–10.3)
Chloride: 104 mmol/L (ref 98–111)
Creatinine, Ser: 1.14 mg/dL (ref 0.61–1.24)
GFR calc Af Amer: >60 mL/min (ref >60)
GFR calc non Af Amer: >60 mL/min (ref >60)
Glucose, Bld: 84 mg/dL (ref 70–99)
Potassium: 4 mmol/L (ref 3.5–5.1)
Sodium: 139 mmol/L (ref 135–145)
Total Bilirubin: 0.3 mg/dL (ref 0.3–1.2)
Total Protein: 5.6 g/dL — ABNORMAL LOW (ref 6.5–8.1)

## 2019-02-14 LAB — CBC
HCT: 37.8 % — ABNORMAL LOW (ref 39.0–52.0)
Hemoglobin: 12.3 g/dL — ABNORMAL LOW (ref 13.0–17.0)
MCH: 34.2 pg — ABNORMAL HIGH (ref 26.0–34.0)
MCHC: 32.5 g/dL (ref 30.0–36.0)
MCV: 105 fL — ABNORMAL HIGH (ref 80.0–100.0)
Platelets: 190 10*3/uL (ref 150–400)
RBC: 3.6 MIL/uL — ABNORMAL LOW (ref 4.22–5.81)
RDW: 14 % (ref 11.5–15.5)
WBC: 3.8 10*3/uL — ABNORMAL LOW (ref 4.0–10.5)
nRBC: 0 % (ref 0.0–0.2)

## 2019-02-14 LAB — TROPONIN I: Troponin I: <0.03 ng/mL (ref <0.03)

## 2019-02-14 MED ORDER — DOXYCYCLINE HYCLATE 100 MG PO CAPS: 100.0000 mg | ORAL_CAPSULE | Freq: Two times a day (BID) | ORAL | 0 refills | Status: DC

## 2019-02-14 MED ORDER — METHOCARBAMOL 500 MG PO TABS: 500.0000 mg | ORAL_TABLET | Freq: Three times a day (TID) | ORAL | 0 refills | Status: DC | PRN

## 2019-02-14 MED ORDER — LORAZEPAM 1 MG PO TABS
1.0000 mg | ORAL_TABLET | Freq: Once | ORAL | Status: AC
  Administered 2019-02-14: 13:00:00 1 mg via ORAL
  Filled 2019-02-14: qty 1

## 2019-02-14 MED ORDER — ALBUTEROL SULFATE HFA 108 (90 BASE) MCG/ACT IN AERS
2.0000 | INHALATION_SPRAY | Freq: Once | RESPIRATORY_TRACT | Status: AC
  Administered 2019-02-14: 13:00:00 2 via RESPIRATORY_TRACT
  Filled 2019-02-14: qty 6.7

## 2019-02-14 MED ORDER — METHOCARBAMOL 500 MG PO TABS
1000.0000 mg | ORAL_TABLET | Freq: Once | ORAL | Status: AC
  Administered 2019-02-14: 13:00:00 1000 mg via ORAL
  Filled 2019-02-14: qty 2

## 2019-02-14 MED FILL — DOXYCYCLINE HYCLATE 100 MG: 100 | 10 days supply | Qty: 20 | Fill #0

## 2019-02-14 MED FILL — METHOCARBAMOL 500 MG TABLET: 500 | 10 days supply | Qty: 30 | Fill #0

## 2019-02-14 NOTE — ED Triage Notes (Signed)
Pt here for SOB, neck pain, night sweats. Pt tearful on exam. Pt states symptoms started a few days. Denies sick contacts. Pt works in Aeronautical engineer
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
## 2019-02-14 NOTE — ED Notes (Signed)
X-ray at bedside

## 2019-02-14 NOTE — Discharge Instructions (Addendum)
You were evaluated in the Emergency Department and after careful evaluation, we did not find any emergent condition requiring admission or further testing in the hospital.  Your test today revealed that you have pneumonia.  This could be due to a bacteria or a virus, possibly the coronavirus.  The cough from the pneumonia seems to have caused your muscles to strain or spasm.  Please take the doxycycline antibiotic as directed.  Please use the Robaxin muscle relaxer as needed for pain, preferably at night if you are having trouble sleeping.  Because of the possibility of coronavirus, please stay home and away from other people until you are feeling completely better.  Please return to the Emergency Department if you experience any worsening of your condition.  We encourage you to follow up with a primary care provider.  Thank you for allowing [place] to be a part of your care.

## 2019-02-19 ENCOUNTER — Emergency Department (HOSPITAL_COMMUNITY): Payer: Self-pay

## 2019-02-19 ENCOUNTER — Inpatient Hospital Stay (HOSPITAL_COMMUNITY)
Admission: EM | Admit: 2019-02-19 | Discharge: 2019-02-24 | DRG: 917 | Disposition: A | Discharge status: Home / Self Care | Payer: Self-pay | Attending: Family Medicine | Admitting: Family Medicine

## 2019-02-19 ENCOUNTER — Encounter (HOSPITAL_COMMUNITY): Payer: Self-pay | Admitting: Emergency Medicine

## 2019-02-19 DIAGNOSIS — E872 Acidosis, unspecified: Secondary | ICD-10-CM

## 2019-02-19 DIAGNOSIS — I452 Bifascicular block: Secondary | ICD-10-CM | POA: Diagnosis present

## 2019-02-19 DIAGNOSIS — J9601 Acute respiratory failure with hypoxia: Secondary | ICD-10-CM | POA: Diagnosis present

## 2019-02-19 DIAGNOSIS — F10229 Alcohol dependence with intoxication, unspecified: Secondary | ICD-10-CM | POA: Diagnosis present

## 2019-02-19 DIAGNOSIS — Z20828 Contact with and (suspected) exposure to other viral communicable diseases: Secondary | ICD-10-CM | POA: Diagnosis present

## 2019-02-19 DIAGNOSIS — Z905 Acquired absence of kidney: Secondary | ICD-10-CM

## 2019-02-19 DIAGNOSIS — Z809 Family history of malignant neoplasm, unspecified: Secondary | ICD-10-CM

## 2019-02-19 DIAGNOSIS — K219 Gastro-esophageal reflux disease without esophagitis: Secondary | ICD-10-CM | POA: Diagnosis present

## 2019-02-19 DIAGNOSIS — M6282 Rhabdomyolysis: Secondary | ICD-10-CM | POA: Diagnosis present

## 2019-02-19 DIAGNOSIS — F1010 Alcohol abuse, uncomplicated: Secondary | ICD-10-CM | POA: Diagnosis present

## 2019-02-19 DIAGNOSIS — J449 Chronic obstructive pulmonary disease, unspecified: Secondary | ICD-10-CM | POA: Diagnosis present

## 2019-02-19 DIAGNOSIS — R402243 Coma scale, best verbal response, confused conversation, at hospital admission: Secondary | ICD-10-CM | POA: Diagnosis present

## 2019-02-19 DIAGNOSIS — F1410 Cocaine abuse, uncomplicated: Secondary | ICD-10-CM | POA: Diagnosis present

## 2019-02-19 DIAGNOSIS — R569 Unspecified convulsions: Secondary | ICD-10-CM

## 2019-02-19 DIAGNOSIS — R402143 Coma scale, eyes open, spontaneous, at hospital admission: Secondary | ICD-10-CM | POA: Diagnosis present

## 2019-02-19 DIAGNOSIS — R740 Nonspecific elevation of levels of transaminase and lactic acid dehydrogenase [LDH]: Secondary | ICD-10-CM

## 2019-02-19 DIAGNOSIS — R29719 NIHSS score 19: Secondary | ICD-10-CM | POA: Diagnosis present

## 2019-02-19 DIAGNOSIS — K7010 Alcoholic hepatitis without ascites: Secondary | ICD-10-CM | POA: Diagnosis present

## 2019-02-19 DIAGNOSIS — R402363 Coma scale, best motor response, obeys commands, at hospital admission: Secondary | ICD-10-CM | POA: Diagnosis present

## 2019-02-19 DIAGNOSIS — R1310 Dysphagia, unspecified: Secondary | ICD-10-CM | POA: Diagnosis present

## 2019-02-19 DIAGNOSIS — G40909 Epilepsy, unspecified, not intractable, without status epilepticus: Secondary | ICD-10-CM | POA: Diagnosis present

## 2019-02-19 DIAGNOSIS — A4150 Gram-negative sepsis, unspecified: Secondary | ICD-10-CM | POA: Diagnosis present

## 2019-02-19 DIAGNOSIS — G92 Toxic encephalopathy: Secondary | ICD-10-CM | POA: Diagnosis present

## 2019-02-19 DIAGNOSIS — R7401 Elevation of levels of liver transaminase levels: Secondary | ICD-10-CM

## 2019-02-19 DIAGNOSIS — Z91018 Allergy to other foods: Secondary | ICD-10-CM

## 2019-02-19 DIAGNOSIS — R531 Weakness: Secondary | ICD-10-CM

## 2019-02-19 DIAGNOSIS — Z886 Allergy status to analgesic agent status: Secondary | ICD-10-CM

## 2019-02-19 DIAGNOSIS — R6521 Severe sepsis with septic shock: Secondary | ICD-10-CM | POA: Diagnosis present

## 2019-02-19 DIAGNOSIS — F17200 Nicotine dependence, unspecified, uncomplicated: Secondary | ICD-10-CM | POA: Diagnosis present

## 2019-02-19 DIAGNOSIS — Z7951 Long term (current) use of inhaled steroids: Secondary | ICD-10-CM

## 2019-02-19 DIAGNOSIS — J441 Chronic obstructive pulmonary disease with (acute) exacerbation: Secondary | ICD-10-CM | POA: Diagnosis present

## 2019-02-19 DIAGNOSIS — Z833 Family history of diabetes mellitus: Secondary | ICD-10-CM

## 2019-02-19 DIAGNOSIS — Z9049 Acquired absence of other specified parts of digestive tract: Secondary | ICD-10-CM

## 2019-02-19 DIAGNOSIS — T405X1A Poisoning by cocaine, accidental (unintentional), initial encounter: Principal | ICD-10-CM | POA: Diagnosis present

## 2019-02-19 DIAGNOSIS — Z8249 Family history of ischemic heart disease and other diseases of the circulatory system: Secondary | ICD-10-CM

## 2019-02-19 DIAGNOSIS — Z880 Allergy status to penicillin: Secondary | ICD-10-CM

## 2019-02-19 DIAGNOSIS — E860 Dehydration: Secondary | ICD-10-CM | POA: Diagnosis present

## 2019-02-19 DIAGNOSIS — Z720 Tobacco use: Secondary | ICD-10-CM | POA: Diagnosis present

## 2019-02-19 DIAGNOSIS — F1910 Other psychoactive substance abuse, uncomplicated: Secondary | ICD-10-CM | POA: Diagnosis present

## 2019-02-19 DIAGNOSIS — N179 Acute kidney failure, unspecified: Secondary | ICD-10-CM | POA: Diagnosis present

## 2019-02-19 DIAGNOSIS — Z79899 Other long term (current) drug therapy: Secondary | ICD-10-CM

## 2019-02-19 DIAGNOSIS — R4182 Altered mental status, unspecified: Secondary | ICD-10-CM

## 2019-02-19 DIAGNOSIS — J690 Pneumonitis due to inhalation of food and vomit: Secondary | ICD-10-CM | POA: Diagnosis present

## 2019-02-19 LAB — CBC WITH DIFFERENTIAL/PLATELET
Abs Immature Granulocytes: 0.11 10*3/uL — ABNORMAL HIGH (ref 0.00–0.07)
Basophils Absolute: 0 10*3/uL (ref 0.0–0.1)
Basophils Relative: 0 %
Eosinophils Absolute: 0 10*3/uL (ref 0.0–0.5)
Eosinophils Relative: 0 %
HCT: 46.5 % (ref 39.0–52.0)
Hemoglobin: 14.1 g/dL (ref 13.0–17.0)
Immature Granulocytes: 1 %
Lymphocytes Relative: 6 %
Lymphs Abs: 0.9 10*3/uL (ref 0.7–4.0)
MCH: 33.7 pg (ref 26.0–34.0)
MCHC: 30.3 g/dL (ref 30.0–36.0)
MCV: 111.2 fL — ABNORMAL HIGH (ref 80.0–100.0)
Monocytes Absolute: 1 10*3/uL (ref 0.1–1.0)
Monocytes Relative: 6 %
Neutro Abs: 13.4 10*3/uL — ABNORMAL HIGH (ref 1.7–7.7)
Neutrophils Relative %: 87 %
Platelets: 230 10*3/uL (ref 150–400)
RBC: 4.18 MIL/uL — ABNORMAL LOW (ref 4.22–5.81)
RDW: 14.3 % (ref 11.5–15.5)
WBC: 15.4 10*3/uL — ABNORMAL HIGH (ref 4.0–10.5)
nRBC: 0 % (ref 0.0–0.2)

## 2019-02-19 LAB — COMPREHENSIVE METABOLIC PANEL
ALT: 324 U/L — ABNORMAL HIGH (ref 0–44)
AST: 410 U/L — ABNORMAL HIGH (ref 15–41)
Albumin: 3.4 g/dL — ABNORMAL LOW (ref 3.5–5.0)
Alkaline Phosphatase: 133 U/L — ABNORMAL HIGH (ref 38–126)
Anion gap: 18 — ABNORMAL HIGH (ref 5–15)
BUN: 16 mg/dL (ref 6–20)
CO2: 15 mmol/L — ABNORMAL LOW (ref 22–32)
Calcium: 8.1 mg/dL — ABNORMAL LOW (ref 8.9–10.3)
Chloride: 105 mmol/L (ref 98–111)
Creatinine, Ser: 2.21 mg/dL — ABNORMAL HIGH (ref 0.61–1.24)
GFR calc Af Amer: 39 mL/min — ABNORMAL LOW (ref >60)
GFR calc non Af Amer: 33 mL/min — ABNORMAL LOW (ref >60)
Glucose, Bld: 201 mg/dL — ABNORMAL HIGH (ref 70–99)
Potassium: 5.6 mmol/L — ABNORMAL HIGH (ref 3.5–5.1)
Sodium: 138 mmol/L (ref 135–145)
Total Bilirubin: 0.2 mg/dL — ABNORMAL LOW (ref 0.3–1.2)
Total Protein: 6 g/dL — ABNORMAL LOW (ref 6.5–8.1)

## 2019-02-19 LAB — POCT I-STAT EG7
Acid-base deficit: 15 mmol/L — ABNORMAL HIGH (ref 0.0–2.0)
Bicarbonate: 15.6 mmol/L — ABNORMAL LOW (ref 20.0–28.0)
Calcium, Ion: 1 mmol/L — ABNORMAL LOW (ref 1.15–1.40)
HCT: 44 % (ref 39.0–52.0)
Hemoglobin: 15 g/dL (ref 13.0–17.0)
O2 Saturation: 88 %
Potassium: 5.6 mmol/L — ABNORMAL HIGH (ref 3.5–5.1)
Sodium: 136 mmol/L (ref 135–145)
TCO2: 17 mmol/L — ABNORMAL LOW (ref 22–32)
pCO2, Ven: 53.2 mmHg (ref 44.0–60.0)
pH, Ven: 7.074 — CL (ref 7.250–7.430)
pO2, Ven: 76 mmHg — ABNORMAL HIGH (ref 32.0–45.0)

## 2019-02-19 LAB — ETHANOL: Alcohol, Ethyl (B): 132 mg/dL — ABNORMAL HIGH (ref <10)

## 2019-02-19 LAB — CBG MONITORING, ED
Glucose-Capillary: 56 mg/dL — ABNORMAL LOW (ref 70–99)
Glucose-Capillary: 142 mg/dL — ABNORMAL HIGH (ref 70–99)

## 2019-02-19 LAB — SALICYLATE LEVEL: Salicylate Lvl: <7 mg/dL (ref 2.8–30.0)

## 2019-02-19 LAB — MAGNESIUM: Magnesium: 2.4 mg/dL (ref 1.7–2.4)

## 2019-02-19 LAB — LACTIC ACID, PLASMA: Lactic Acid, Venous: 8.3 mmol/L (ref 0.5–1.9)

## 2019-02-19 LAB — ACETAMINOPHEN LEVEL: Acetaminophen (Tylenol), Serum: <10 ug/mL — ABNORMAL LOW (ref 10–30)

## 2019-02-19 LAB — SARS CORONAVIRUS 2 BY RT PCR (HOSPITAL ORDER, PERFORMED IN ~~LOC~~ HOSPITAL LAB): SARS Coronavirus 2: NEGATIVE

## 2019-02-19 LAB — PHOSPHORUS: Phosphorus: 11.2 mg/dL — ABNORMAL HIGH (ref 2.5–4.6)

## 2019-02-19 MED ORDER — NALOXONE HCL 0.4 MG/ML IJ SOLN
0.4000 mg | Freq: Once | INTRAMUSCULAR | Status: AC
  Administered 2019-02-19: 0.4 mg via INTRAVENOUS
  Filled 2019-02-19: qty 1

## 2019-02-19 MED ORDER — LACTATED RINGERS BOLUS PEDS
1000.0000 mL | Freq: Once | INTRAVENOUS | Status: AC
  Administered 2019-02-19: 1000 mL via INTRAVENOUS

## 2019-02-19 MED ORDER — METRONIDAZOLE IN NACL 5-0.79 MG/ML-% IV SOLN
500.0000 mg | Freq: Two times a day (BID) | INTRAVENOUS | Status: DC
  Administered 2019-02-19 – 2019-02-21 (×4): 500 mg via INTRAVENOUS
  Filled 2019-02-19 (×4): qty 100

## 2019-02-19 MED ORDER — SODIUM CHLORIDE 0.9 % IV SOLN
2.0000 g | Freq: Once | INTRAVENOUS | Status: AC
  Administered 2019-02-19: 21:00:00 2 g via INTRAVENOUS
  Filled 2019-02-19: qty 2

## 2019-02-19 MED ORDER — SODIUM CHLORIDE 0.9 % IV BOLUS
1000.0000 mL | Freq: Once | INTRAVENOUS | Status: AC
  Administered 2019-02-19: 22:00:00 1000 mL via INTRAVENOUS

## 2019-02-19 MED ORDER — LEVETIRACETAM IN NACL 1000 MG/100ML IV SOLN
1000.0000 mg | Freq: Once | INTRAVENOUS | Status: AC
  Administered 2019-02-19: 20:00:00 1000 mg via INTRAVENOUS

## 2019-02-19 MED ORDER — SODIUM CHLORIDE 0.9 % IV BOLUS
1000.0000 mL | Freq: Once | INTRAVENOUS | Status: AC
  Administered 2019-02-19: 1000 mL via INTRAVENOUS

## 2019-02-19 MED ORDER — VANCOMYCIN HCL IN DEXTROSE 1-5 GM/200ML-% IV SOLN: 1000.0000 mg | Freq: Once | INTRAVENOUS | Status: DC

## 2019-02-19 MED ORDER — VANCOMYCIN HCL 10 G IV SOLR
1500.0000 mg | Freq: Once | INTRAVENOUS | Status: AC
  Administered 2019-02-19: 1500 mg via INTRAVENOUS
  Filled 2019-02-19: qty 1500

## 2019-02-19 MED ORDER — DEXTROSE 50 % IV SOLN
INTRAVENOUS | Status: AC
  Administered 2019-02-19: 20:00:00
  Filled 2019-02-19: qty 50

## 2019-02-19 MED ORDER — IOHEXOL 350 MG/ML SOLN
75.0000 mL | Freq: Once | INTRAVENOUS | Status: AC | PRN
  Administered 2019-02-19: 75 mL via INTRAVENOUS
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
## 2019-02-19 NOTE — ED Notes (Addendum)
Complete linen change / gown change with stool incontinence care provided , Flexiseal applied , repositioned on bed , IV sites intact , respirations unlabored . placed on a monitor/pulse oximetry . Patient remained somnolent lethargic - will open his eyes briefly and goes back to sleep .

## 2019-02-19 NOTE — ED Notes (Signed)
EEG technician at bedside

## 2019-02-19 NOTE — ED Notes (Signed)
Returned from CT scan , complete bed /linen change , stool incontinence care provided , condom cath applied / adult brief applied , Bair Hugger reapplied , waiting for admitting MD. Patient remained lethargic/somnolent.
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
## 2019-02-19 NOTE — ED Notes (Signed)
Patient transported to CT scan . 

## 2019-02-19 NOTE — ED Notes (Signed)
Patient placed on a monitor and pulse oximetry , warm NS IV bolus infusing , IV sites intact , Bair hugger intact , specimen for Corona virus nasal swab sent to lab .

## 2019-02-19 NOTE — Progress Notes (Signed)
EEG Complete  Results Pending 
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
## 2019-02-19 NOTE — Procedures (Signed)
History: 53 year old male presenting with unresponsiveness  Sedation: None  Technique: This is a 21 channel routine scalp EEG performed at the bedside with bipolar and monopolar montages arranged in accordance to the international 10/20 system of electrode placement. One channel was dedicated to EKG recording.    Background: The background consists predominantly of drowsiness and sleep.  During wakefulness, however there is a well defined posterior dominant rhythm of 8-9 Hz that attenuates with eye opening.  There is some intrusion of diffuse irregular delta and theta activity even during maximal wakefulness.  Sleep is recorded with normal appearing structures.   Photic stimulation: Physiologic driving is not performed  EEG Abnormalities: 1) mild intrusion of diffuse slow activity into the background  Clinical Interpretation: This EEG is consistent with a mild nonspecific generalized cerebral dysfunction (encephalopathy). There was no seizure or seizure predisposition recorded on this study. Please note that lack of epileptiform activity on EEG does not preclude the possibility of epilepsy.   Ritta Slot, MD [facility] [phone]  If 7pm- 7am, please page neurology on call as listed in AMION.

## 2019-02-19 NOTE — Progress Notes (Signed)
RT at bedside upon pt's arrival. Unable to pick up SpO2 due to pt's temp, pt left on 100% NRB at this time. Pt with coarse bbs, airway intact. RT will continue to monitor.

## 2019-02-20 ENCOUNTER — Other Ambulatory Visit: Payer: Self-pay

## 2019-02-20 DIAGNOSIS — R4182 Altered mental status, unspecified: Secondary | ICD-10-CM

## 2019-02-20 DIAGNOSIS — N179 Acute kidney failure, unspecified: Secondary | ICD-10-CM | POA: Diagnosis present

## 2019-02-20 DIAGNOSIS — A4150 Gram-negative sepsis, unspecified: Secondary | ICD-10-CM | POA: Diagnosis present

## 2019-02-20 DIAGNOSIS — E872 Acidosis, unspecified: Secondary | ICD-10-CM | POA: Diagnosis present

## 2019-02-20 DIAGNOSIS — F1010 Alcohol abuse, uncomplicated: Secondary | ICD-10-CM

## 2019-02-20 DIAGNOSIS — K7010 Alcoholic hepatitis without ascites: Secondary | ICD-10-CM | POA: Diagnosis present

## 2019-02-20 DIAGNOSIS — J690 Pneumonitis due to inhalation of food and vomit: Secondary | ICD-10-CM | POA: Diagnosis present

## 2019-02-20 LAB — LACTIC ACID, PLASMA
Lactic Acid, Venous: 1.2 mmol/L (ref 0.5–1.9)
Lactic Acid, Venous: 1.2 mmol/L (ref 0.5–1.9)
Lactic Acid, Venous: 3.4 mmol/L (ref 0.5–1.9)
Lactic Acid, Venous: 5.1 mmol/L (ref 0.5–1.9)

## 2019-02-20 LAB — COMPREHENSIVE METABOLIC PANEL
ALT: 279 U/L — ABNORMAL HIGH (ref 0–44)
AST: 345 U/L — ABNORMAL HIGH (ref 15–41)
Albumin: 3.3 g/dL — ABNORMAL LOW (ref 3.5–5.0)
Alkaline Phosphatase: 62 U/L (ref 38–126)
Anion gap: 11 (ref 5–15)
BUN: 24 mg/dL — ABNORMAL HIGH (ref 6–20)
CO2: 20 mmol/L — ABNORMAL LOW (ref 22–32)
Calcium: 7.6 mg/dL — ABNORMAL LOW (ref 8.9–10.3)
Chloride: 109 mmol/L (ref 98–111)
Creatinine, Ser: 2.28 mg/dL — ABNORMAL HIGH (ref 0.61–1.24)
GFR calc Af Amer: 37 mL/min — ABNORMAL LOW (ref >60)
GFR calc non Af Amer: 32 mL/min — ABNORMAL LOW (ref >60)
Glucose, Bld: 90 mg/dL (ref 70–99)
Potassium: 5 mmol/L (ref 3.5–5.1)
Sodium: 140 mmol/L (ref 135–145)
Total Bilirubin: 0.6 mg/dL (ref 0.3–1.2)
Total Protein: 6 g/dL — ABNORMAL LOW (ref 6.5–8.1)

## 2019-02-20 LAB — RAPID URINE DRUG SCREEN, HOSP PERFORMED
Amphetamines: NOT DETECTED
Barbiturates: NOT DETECTED
Benzodiazepines: NOT DETECTED
Cocaine: POSITIVE — AB
Opiates: NOT DETECTED
Tetrahydrocannabinol: NOT DETECTED

## 2019-02-20 LAB — POCT I-STAT 7, (LYTES, BLD GAS, ICA,H+H)
Acid-base deficit: 13 mmol/L — ABNORMAL HIGH (ref 0.0–2.0)
Bicarbonate: 16.1 mmol/L — ABNORMAL LOW (ref 20.0–28.0)
Calcium, Ion: 1.07 mmol/L — ABNORMAL LOW (ref 1.15–1.40)
HCT: 40 % (ref 39.0–52.0)
Hemoglobin: 13.6 g/dL (ref 13.0–17.0)
O2 Saturation: 99 %
Patient temperature: 98.6
Potassium: 5.4 mmol/L — ABNORMAL HIGH (ref 3.5–5.1)
Sodium: 139 mmol/L (ref 135–145)
TCO2: 17 mmol/L — ABNORMAL LOW (ref 22–32)
pCO2 arterial: 46.5 mmHg (ref 32.0–48.0)
pH, Arterial: 7.147 — CL (ref 7.350–7.450)
pO2, Arterial: 154 mmHg — ABNORMAL HIGH (ref 83.0–108.0)

## 2019-02-20 LAB — URINALYSIS, ROUTINE W REFLEX MICROSCOPIC
Bilirubin Urine: NEGATIVE
Glucose, UA: 50 mg/dL — AB
Ketones, ur: NEGATIVE mg/dL
Leukocytes,Ua: NEGATIVE
Nitrite: NEGATIVE
Protein, ur: 30 mg/dL — AB
Specific Gravity, Urine: 1.017 (ref 1.005–1.030)
pH: 5 (ref 5.0–8.0)

## 2019-02-20 LAB — C DIFFICILE QUICK SCREEN W PCR REFLEX
C Diff antigen: NEGATIVE
C Diff interpretation: NOT DETECTED
C Diff toxin: NEGATIVE

## 2019-02-20 LAB — PROTIME-INR
INR: 1.1 (ref 0.8–1.2)
Prothrombin Time: 14.2 seconds (ref 11.4–15.2)

## 2019-02-20 LAB — CK: Total CK: 3177 U/L — ABNORMAL HIGH (ref 49–397)

## 2019-02-20 LAB — MRSA PCR SCREENING: MRSA by PCR: NEGATIVE

## 2019-02-20 LAB — CBC
HCT: 43.8 % (ref 39.0–52.0)
Hemoglobin: 14.1 g/dL (ref 13.0–17.0)
MCH: 34.2 pg — ABNORMAL HIGH (ref 26.0–34.0)
MCHC: 32.2 g/dL (ref 30.0–36.0)
MCV: 106.3 fL — ABNORMAL HIGH (ref 80.0–100.0)
Platelets: 208 10*3/uL (ref 150–400)
RBC: 4.12 MIL/uL — ABNORMAL LOW (ref 4.22–5.81)
RDW: 14.4 % (ref 11.5–15.5)
WBC: 14.1 10*3/uL — ABNORMAL HIGH (ref 4.0–10.5)
nRBC: 0 % (ref 0.0–0.2)

## 2019-02-20 LAB — TSH: TSH: 1.396 u[IU]/mL (ref 0.350–4.500)

## 2019-02-20 LAB — CORTISOL-AM, BLOOD: Cortisol - AM: 19.3 ug/dL (ref 6.7–22.6)

## 2019-02-20 LAB — CBG MONITORING, ED: Glucose-Capillary: 81 mg/dL (ref 70–99)

## 2019-02-20 LAB — PROCALCITONIN
Procalcitonin: 14.73 ng/mL
Procalcitonin: 15.18 ng/mL

## 2019-02-20 LAB — AMMONIA: Ammonia: 12 umol/L (ref 9–35)

## 2019-02-20 MED ORDER — LORAZEPAM 2 MG/ML IJ SOLN: 1.0000 mg | Freq: Four times a day (QID) | INTRAMUSCULAR | Status: AC | PRN

## 2019-02-20 MED ORDER — LORAZEPAM 2 MG/ML IJ SOLN
0.0000 mg | Freq: Four times a day (QID) | INTRAMUSCULAR | Status: AC
  Administered 2019-02-21: 1 mg via INTRAVENOUS
  Filled 2019-02-20: qty 1

## 2019-02-20 MED ORDER — THIAMINE HCL 100 MG/ML IJ SOLN
Freq: Once | INTRAVENOUS | Status: AC
  Administered 2019-02-20: 05:00:00 via INTRAVENOUS
  Filled 2019-02-20: qty 1000

## 2019-02-20 MED ORDER — LORAZEPAM 2 MG/ML IJ SOLN
0.0000 mg | Freq: Two times a day (BID) | INTRAMUSCULAR | Status: AC
  Administered 2019-02-22 – 2019-02-23 (×2): 1 mg via INTRAVENOUS
  Filled 2019-02-20 (×2): qty 1

## 2019-02-20 MED ORDER — VITAMIN B-1 100 MG PO TABS
100.0000 mg | ORAL_TABLET | Freq: Every day | ORAL | Status: DC
  Administered 2019-02-22 – 2019-02-24 (×3): 100 mg via ORAL
  Filled 2019-02-20 (×4): qty 1

## 2019-02-20 MED ORDER — FOLIC ACID 1 MG PO TABS
1.0000 mg | ORAL_TABLET | Freq: Every day | ORAL | Status: DC
  Administered 2019-02-22 – 2019-02-24 (×3): 1 mg via ORAL
  Filled 2019-02-20 (×4): qty 1

## 2019-02-20 MED ORDER — THIAMINE HCL 100 MG/ML IJ SOLN: 100.0000 mg | Freq: Every day | INTRAMUSCULAR | Status: DC

## 2019-02-20 MED ORDER — SODIUM CHLORIDE 0.9 % IV SOLN
INTRAVENOUS | Status: DC
  Administered 2019-02-20 – 2019-02-22 (×6): via INTRAVENOUS
  Administered 2019-02-23: 1000 mL via INTRAVENOUS
  Administered 2019-02-23: 22:00:00 via INTRAVENOUS
  Administered 2019-02-24: 1000 mL via INTRAVENOUS

## 2019-02-20 MED ORDER — SODIUM CHLORIDE 0.9 % IV SOLN
2.0000 g | INTRAVENOUS | Status: DC
  Administered 2019-02-20: 2 g via INTRAVENOUS
  Filled 2019-02-20 (×2): qty 2

## 2019-02-20 MED ORDER — VANCOMYCIN HCL 10 G IV SOLR
1500.0000 mg | INTRAVENOUS | Status: DC
  Filled 2019-02-20: qty 1500

## 2019-02-20 MED ORDER — ORAL CARE MOUTH RINSE
15.0000 mL | Freq: Two times a day (BID) | OROMUCOSAL | Status: DC
  Administered 2019-02-20 – 2019-02-23 (×6): 15 mL via OROMUCOSAL

## 2019-02-20 MED ORDER — SODIUM CHLORIDE 0.9 % IV SOLN: 2.0000 g | Freq: Once | INTRAVENOUS | Status: DC

## 2019-02-20 MED ORDER — LORAZEPAM 1 MG PO TABS: 1.0000 mg | ORAL_TABLET | Freq: Four times a day (QID) | ORAL | Status: AC | PRN

## 2019-02-20 MED ORDER — ONDANSETRON HCL 4 MG/2ML IJ SOLN
4.0000 mg | Freq: Four times a day (QID) | INTRAMUSCULAR | Status: DC | PRN
  Administered 2019-02-20 – 2019-02-21 (×2): 4 mg via INTRAVENOUS
  Filled 2019-02-20 (×2): qty 2

## 2019-02-20 MED ORDER — ONDANSETRON HCL 4 MG PO TABS
4.0000 mg | ORAL_TABLET | Freq: Four times a day (QID) | ORAL | Status: DC | PRN
  Filled 2019-02-20: qty 1

## 2019-02-20 MED ORDER — ADULT MULTIVITAMIN W/MINERALS CH
1.0000 | ORAL_TABLET | Freq: Every day | ORAL | Status: DC
  Administered 2019-02-22 – 2019-02-24 (×3): 1 via ORAL
  Filled 2019-02-20 (×4): qty 1

## 2019-02-20 MED ORDER — LEVETIRACETAM IN NACL 1000 MG/100ML IV SOLN
1000.0000 mg | Freq: Two times a day (BID) | INTRAVENOUS | Status: DC
  Administered 2019-02-20 – 2019-02-22 (×5): 1000 mg via INTRAVENOUS
  Filled 2019-02-20 (×8): qty 100

## 2019-02-20 MED ORDER — VANCOMYCIN HCL IN DEXTROSE 1-5 GM/200ML-% IV SOLN: 1000.0000 mg | Freq: Once | INTRAVENOUS | Status: DC

## 2019-02-20 NOTE — ED Notes (Signed)
Patient is awake / more alert , requested a urinal and water to drink , PA notified.

## 2019-02-20 NOTE — H&P (Signed)
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 (ARF) (HCC)   Alcoholic hepatitis without ascites     #1 severe sepsis: Most likely secondary to aspiration pneumonia.  Also possible healthcare associated pneumonia with recent pneumonia.  He has poor dentition.  At this point will admit the patient to progressive care unit.  Follow the sepsis protocol.  Aggressive antibiotics.  Await blood cultures as well as sputum cultures.  #2 possible seizure: Patient's altered mental status could be due to seizure or drug withdrawal.  He is positive for cocaine.  Known history of alcohol abuse but alcohol level is negative.  Neurology has evaluated patient.  I will continue with Keppra while in the hospital.  #3 polysubstance abuse: Including alcohol tobacco cocaine and other substances.  Counseling will be provided once patient is stable.  Supportive care only at this point.  #4 lactic acidosis: Most likely a combination  of sepsis, acute kidney injury, and possible seizure.   Patient has been hydrated and is improving.  Cycle lactic acid levels.  #5 acute kidney injury: Most likely prerenal.  Aggressive hydration and follow renal function closely.  #6 alcoholic hepatitis: LFTs are elevated.  Most likely secondary to alcoholism.  It could also be secondary to sepsis.  Follow LFTs.  Supportive measures only.  Acute hepatitis panel.  #7 hypoxic respiratory failure: Most likely secondary to pneumonia.  Patient also has history of COPD with mild exacerbation.  I will hold off on steroids at this point.  Oxygen and treatment with antibiotics.  Nebulizer treatments.  Patient is COVID-19 negative.   DVT prophylaxis: SCD Code Status: Full code Family Communication: No family at bedside Disposition Plan: To be determined Consults called: Neurology and critical care medicine have been consulted by ER Admission status: Inpatient to progressive care unit  Severity of Illness: The appropriate patient status for this patient is INPATIENT. Inpatient status is judged to be reasonable and necessary in order to provide the required intensity of service to ensure the patient's safety. The patient's presenting symptoms, physical exam findings, and initial radiographic and laboratory data in the context of their chronic comorbidities is felt to place them at high risk for further clinical deterioration. Furthermore, it is not anticipated that the patient will be medically stable for discharge from the hospital within 2 midnights of admission. The following factors support the patient status of inpatient.   " The patient's presenting symptoms include altered mental status. " The worrisome physical exam findings include confusion. " The initial radiographic and laboratory data are worrisome because of evidence of septic shock. " The chronic co-morbidities include polysubstance abuse.   * I certify that at the point of admission it is my clinical judgment that the patient will require  inpatient hospital care spanning beyond 2 midnights from the point of admission due to high intensity of service, high risk for further deterioration and high frequency of surveillance required.Lonia Blood MD [facility] Pager [phone]  If 7PM-7AM, please contact night-coverage [email or web address] Password [email or web address]  02/20/2019, 3:35 AM

## 2019-02-20 NOTE — Progress Notes (Signed)
Pt arrived to 5w20, alert and oriented to self, place and time, disoriented to situation. Identified appropriately. VS stable, no signs of acute distress. Pt placed on PC monitor, and CCMD notified. Pt informed about belongings policy. Pt oriented to room and equipment, instructed to use call bell for assistance, and verbalized understanding. Call bell left within reach and bed alarm activated.  Will continue to monitor and treat pt per MD orders.
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
## 2019-02-20 NOTE — ED Provider Notes (Signed)
I assumed care of patient at shift change from previous team, please see their note for full H&P.  Briefly patient is here for evaluation of being altered mental status.  He has transaminitis, possible opioid overdose, seizures, has been seen by neurology and Keppra loaded, and pneumonia with elevated lactic acid.  Plan is to follow-up on repeat lactic and discussed with PCCM.     Physical Exam  BP 93/66   Pulse 88   Temp 97.6 F (36.4 C) (Temporal)   Resp 13   SpO2 100%   Physical Exam Vitals signs and nursing note reviewed.     ED Course/Procedures   Clinical Course as of Feb 20 735  Thu Feb 19, 2019  2359 Went to evaluate patient, he has pinpoint pupils bilaterally.  He was 88% on 4liters [place].  NRB placed and Oxygen improved.  He awakens to voice, is alert and oriented to person and place.    [EH]  Fri Feb 20, 2019  [phone]   [EH]  0130 E-link MD says they will get someone to come look at him.    [EH]  0306 PCCM saw patient, says ok for hosptialist to admit.    [EH]  [phone] Spoke with Dr. Mikeal Hawthorne who will admit patient.     [EH]    Clinical Course User Index [EH] Cristina Gong, PA-C    Procedures  MDM  Patient presents today for evaluation of multiple complaints.  I assumed care of patient from previous change at shift change.  Please see ED clinical course.  I spoke with a link/PCCM who came to evaluate patient and felt that he was suitable for stepdown.  I spoke with Dr. Mikeal Hawthorne who agreed to admit the patient.  While in the ED patient did have multiple bouts of foul-smelling diarrhea, C. difficile panel was ordered.       Cristina Gong, PA-C 02/20/19 0739    Ward, Layla Maw, DO 02/20/19 [phone]

## 2019-02-20 NOTE — ED Notes (Signed)
Stool specimen collected and hand carried to lab by EMT.

## 2019-02-20 NOTE — Progress Notes (Signed)
Pharmacy Antibiotic Note  David Benson is a 53 y.o. male admitted on 02/19/2019 with pneumonia.  Pharmacy has been consulted for vancomycin and cefepime dosing. Cefepime 2gm and vancomycin 1500 mg ordered in the ED  Plan: Continue cefepime 2gm IV q24 hours Continue vancomycin 1500 mg IV q36 hours F/u renal function, cultures and clinical course     Temp (24hrs), Avg:94.3 F (34.6 C), Min:85.6 F (29.8 C), Max:98.2 F (36.8 C)  Recent Labs  Lab 02/14/19 1330 02/19/19 2011 02/19/19 2028 02/19/19 2340 02/20/19 0116  WBC 3.8* 15.4*  --   --   --   CREATININE 1.14 2.21*  --   --   --   LATICACIDVEN  --   --  8.3* 5.1* 3.4*    Estimated Creatinine Clearance: 39.5 mL/min (A) (by C-G formula based on SCr of 2.21 mg/dL (H)).    Allergies  Allergen Reactions  . Naprosyn [Naproxen]   . Penicillins Itching    Has patient had a PCN reaction causing immediate rash, facial/tongue/throat swelling, SOB or lightheadedness with hypotension: No Has patient had a PCN reaction causing severe rash involving mucus membranes or skin necrosis: No Has patient had a PCN reaction that required hospitalization No Has patient had a PCN reaction occurring within the last 10 years: No If all of the above answers are "NO", then may proceed with Cephalosporin use.  . Pork-Derived Products     Pt does not eat pork    Thank you for allowing pharmacy to be a part of this patient's care.  Talbert Cage [place]et 02/20/2019 3:37 AM

## 2019-02-20 NOTE — Progress Notes (Addendum)
Patient seen and examined this morning, admitted overnight, H&P reviewed and agree with the assessment and plan.  In brief, this is a 53 year old male with history of polysubstance abuse, seizure disorder, alcohol and cocaine abuse, asthma, recent pneumonia who was found down in front of his house in the evening of 4/13 by the family.  He was found to have multiple episodes of possible seizures with eye deviation and disconjugate gaze, also had pinpoint pupils with mild response to Narcan.  He was hypoxic, hypothermic, and was brought to the ED.  He was severely acidotic with a pH of 7.1 and lactic acid was 8.  Chest x-ray showed pneumonia.  Neurology was called for concern for seizure disorder as well as critical care due to severe acidosis.  He improved with fluids, Keppra, antibiotics and was eventually admitted to the hospitalist service.   Acute metabolic encephalopathy -Likely multifactorial with possible seizure, polysubstance abuse, as well as sepsis.  Supportive treatment for underlying illnesses -Mental status slightly improved, no longer obtunded but still lethargic.  Continue to monitor in stepdown  Severe sepsis due to healthcare associated pneumonia, concern for aspiration pneumonia -Patient initially started on vancomycin, cefepime and metronidazole, continue, monitor cultures  Acute kidney injury -Likely in the setting of sepsis, seizures with associated mild rhabdomyolysis -Continue IV fluids, creatinine stable this morning compared to last night  Rhabdomyolysis -Likely in the setting of seizure disorder, continue IV fluids and closely monitor CK levels  Seizure disorder -Appreciate neurology input, unclear home compliance, for now continue Keppra on home dose  Alcohol abuse -Continue CIWA protocol, alcohol level in the ED negative  Alcoholic hepatitis -Monitor LFTs  Cocaine abuse -We will discuss with patient once more awake  Scheduled Meds: . folic acid  1 mg Oral  Daily  . LORazepam  0-4 mg Intravenous Q6H   Followed by  . [START ON 02/22/2019] LORazepam  0-4 mg Intravenous Q12H  . mouth rinse  15 mL Mouth Rinse BID  . multivitamin with minerals  1 tablet Oral Daily  . thiamine  100 mg Oral Daily   Continuous Infusions: . sodium chloride 125 mL/hr at 02/20/19 0522  . ceFEPime (MAXIPIME) IV    . levETIRAcetam    . metronidazole 500 mg (02/20/19 0813)  . [START ON 02/21/2019] vancomycin     PRN Meds:.LORazepam **OR** LORazepam, ondansetron **OR** ondansetron (ZOFRAN) IV   M. Elvera Lennox, MD, PhD [facility]  Contact via  [email or web address]  [facility] Office Info P: [phone]  F: [phone]

## 2019-02-21 ENCOUNTER — Encounter (HOSPITAL_COMMUNITY): Payer: Self-pay

## 2019-02-21 DIAGNOSIS — J690 Pneumonitis due to inhalation of food and vomit: Secondary | ICD-10-CM

## 2019-02-21 LAB — HEPATITIS PANEL, ACUTE
HCV Ab: <0.1 s/co ratio (ref 0.0–0.9)
Hep A IgM: NEGATIVE
Hep B C IgM: NEGATIVE
Hepatitis B Surface Ag: NEGATIVE

## 2019-02-21 LAB — COMPREHENSIVE METABOLIC PANEL
ALT: 163 U/L — ABNORMAL HIGH (ref 0–44)
AST: 181 U/L — ABNORMAL HIGH (ref 15–41)
Albumin: 2.8 g/dL — ABNORMAL LOW (ref 3.5–5.0)
Alkaline Phosphatase: 50 U/L (ref 38–126)
Anion gap: 7 (ref 5–15)
BUN: 18 mg/dL (ref 6–20)
CO2: 21 mmol/L — ABNORMAL LOW (ref 22–32)
Calcium: 7.9 mg/dL — ABNORMAL LOW (ref 8.9–10.3)
Chloride: 115 mmol/L — ABNORMAL HIGH (ref 98–111)
Creatinine, Ser: 1.54 mg/dL — ABNORMAL HIGH (ref 0.61–1.24)
GFR calc Af Amer: 60 mL/min — ABNORMAL LOW (ref >60)
GFR calc non Af Amer: 51 mL/min — ABNORMAL LOW (ref >60)
Glucose, Bld: 78 mg/dL (ref 70–99)
Potassium: 4.2 mmol/L (ref 3.5–5.1)
Sodium: 143 mmol/L (ref 135–145)
Total Bilirubin: 0.8 mg/dL (ref 0.3–1.2)
Total Protein: 5 g/dL — ABNORMAL LOW (ref 6.5–8.1)

## 2019-02-21 LAB — CBC
HCT: 37.6 % — ABNORMAL LOW (ref 39.0–52.0)
Hemoglobin: 11.9 g/dL — ABNORMAL LOW (ref 13.0–17.0)
MCH: 33.2 pg (ref 26.0–34.0)
MCHC: 31.6 g/dL (ref 30.0–36.0)
MCV: 105 fL — ABNORMAL HIGH (ref 80.0–100.0)
Platelets: 177 10*3/uL (ref 150–400)
RBC: 3.58 MIL/uL — ABNORMAL LOW (ref 4.22–5.81)
RDW: 14.5 % (ref 11.5–15.5)
WBC: 5.9 10*3/uL (ref 4.0–10.5)
nRBC: 0 % (ref 0.0–0.2)

## 2019-02-21 LAB — CK: Total CK: 2449 U/L — ABNORMAL HIGH (ref 49–397)

## 2019-02-21 LAB — PROCALCITONIN: Procalcitonin: 11.72 ng/mL

## 2019-02-21 LAB — PHOSPHORUS: Phosphorus: 3.3 mg/dL (ref 2.5–4.6)

## 2019-02-21 MED ORDER — METRONIDAZOLE IN NACL 5-0.79 MG/ML-% IV SOLN
500.0000 mg | Freq: Three times a day (TID) | INTRAVENOUS | Status: DC
  Administered 2019-02-21 – 2019-02-23 (×6): 500 mg via INTRAVENOUS
  Filled 2019-02-21 (×6): qty 100

## 2019-02-21 MED ORDER — MORPHINE SULFATE (PF) 2 MG/ML IV SOLN
2.0000 mg | INTRAVENOUS | Status: AC | PRN
  Administered 2019-02-21 – 2019-02-23 (×4): 2 mg via INTRAVENOUS
  Filled 2019-02-21 (×4): qty 1

## 2019-02-21 MED ORDER — SODIUM CHLORIDE 0.9 % IV SOLN
2.0000 g | Freq: Two times a day (BID) | INTRAVENOUS | Status: DC
  Administered 2019-02-21 – 2019-02-24 (×7): 2 g via INTRAVENOUS
  Filled 2019-02-21 (×9): qty 2

## 2019-02-21 MED ORDER — ALUM & MAG HYDROXIDE-SIMETH 200-200-20 MG/5ML PO SUSP
30.0000 mL | ORAL | Status: DC | PRN
  Administered 2019-02-21 – 2019-02-23 (×3): 30 mL via ORAL
  Filled 2019-02-21 (×3): qty 30

## 2019-02-21 MED ORDER — SODIUM CHLORIDE 0.9 % IV SOLN
2.0000 g | Freq: Two times a day (BID) | INTRAVENOUS | Status: DC
  Filled 2019-02-21 (×2): qty 2
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
## 2019-02-21 NOTE — Progress Notes (Signed)
Pharmacy Antibiotic Note  David Benson is a 53 y.o. male admitted on 02/19/2019 with pneumonia.  Pharmacy has been consulted for vancomycin and cefepime dosing. Cefepime 2gm and vancomycin 1500 mg ordered in the ED. SCR improved today to 1.54 (CrCl ~68ml/min), WBC WNL. Antibiotics adjusted for renal function  Plan: Adjust cefepime 2gm IV to q12 hours Adjust metronidazole to 500mg  IV Q8hrs F/u renal function, cultures and clinical course    No data recorded.  Recent Labs  Lab 02/14/19 1330 02/19/19 2011 02/19/19 2028 02/19/19 2340 02/20/19 0116 02/20/19 0454 02/20/19 0728 02/21/19 0258  WBC 3.8* 15.4*  --   --   --  14.1*  --  5.9  CREATININE 1.14 2.21*  --   --   --  2.28*  --  1.54*  LATICACIDVEN  --   --  8.3* 5.1* 3.4* 1.2 1.2  --     Estimated Creatinine Clearance: 56.7 mL/min (A) (by C-G formula based on SCr of 1.54 mg/dL (H)).    Allergies  Allergen Reactions  . Naprosyn [Naproxen]   . Penicillins Itching    Has patient had a PCN reaction causing immediate rash, facial/tongue/throat swelling, SOB or lightheadedness with hypotension: No Has patient had a PCN reaction causing severe rash involving mucus membranes or skin necrosis: No Has patient had a PCN reaction that required hospitalization No Has patient had a PCN reaction occurring within the last 10 years: No If all of the above answers are "NO", then may proceed with Cephalosporin use.  . Pork-Derived Products     Pt does not eat pork    Thank you for allowing pharmacy to be a part of this patient's care.  Lenward Chancellor, PharmD PGY1 Pharmacy Resident 02/21/2019 11:09 AM

## 2019-02-21 NOTE — Progress Notes (Signed)
PROGRESS NOTE  AUTHOR HATLESTAD ZOX:[id number] DOB: Apr 15, 1966 DOA: 02/19/2019 PCP: Patient, No Pcp Per   LOS: 1 day   Brief Narrative / Interim history: 53 year old male with history of polysubstance abuse, seizure disorder, alcohol and cocaine abuse, asthma, recent pneumonia who was found down in front of his house in the evening of 4/13 by the family.  He was found to have multiple episodes of possible seizures with eye deviation and disconjugate gaze, also had pinpoint pupils with mild response to Narcan.  He was hypoxic, hypothermic, and was brought to the ED.  He was severely acidotic with a pH of 7.1 and lactic acid was 8.  Chest x-ray showed pneumonia.  Neurology was called for concern for seizure disorder as well as critical care due to severe acidosis.  He improved with fluids, Keppra, antibiotics and was eventually admitted to the hospitalist service.  Subjective: Complains of generalized weakness.  Assessment & Plan: Principal Problem:   Sepsis, Gram negative (HCC) Active Problems:   Alcohol abuse   GERD   Tobacco abuse   Seizure disorder (HCC)   Polysubstance abuse (HCC)   Post-ictal state (HCC)   Aspiration pneumonia (HCC)   Lactic acid acidosis   Acute renal failure (ARF) (HCC)   Alcoholic hepatitis without ascites   Principal Problem: Acute metabolic encephalopathy -Likely multifactorial with possible seizure, polysubstance abuse, as well as sepsis.  Supportive treatment for underlying illnesses -mental status continues to improve today however still appears extremely weak.  PT to evaluate. -Transfer to telemetry from stepdown today  Active problems: Severe sepsis due to right lower lobe pneumonia, concern for aspiration pneumonia -Patient initially started on vancomycin, cefepime and metronidazole, continue, cultures have remained negative, discontinue vancomycin as MRSA negative, keep on cefepime and metronidazole and will need conversion to orals on  discharge  Acute kidney injury -Likely in the setting of sepsis, seizures with associated mild rhabdomyolysis -Creatinine improving at 1.5, continue IV fluids, still has poor p.o. intake is significant sleepiness and weakness today  Rhabdomyolysis -Likely in the setting of seizure disorder, continue IV fluids and closely  -CK improved with fluids, will not recheck but continue IV fluids  Seizure disorder -Appreciate neurology input, unclear home compliance, for now continue Keppra on home dose  Alcohol abuse -Continue CIWA protocol, alcohol level in the ED negative  Alcoholic hepatitis -Monitor LFTs  Cocaine abuse -Counseled for cessation.    Scheduled Meds:  folic acid  1 mg Oral Daily   LORazepam  0-4 mg Intravenous Q6H   Followed by   Melene Muller ON 02/22/2019] LORazepam  0-4 mg Intravenous Q12H   mouth rinse  15 mL Mouth Rinse BID   multivitamin with minerals  1 tablet Oral Daily   thiamine  100 mg Oral Daily   Continuous Infusions:  sodium chloride 125 mL/hr at 02/21/19 0826   ceFEPime (MAXIPIME) IV     levETIRAcetam Stopped (02/21/19 0835)   metronidazole     PRN Meds:.LORazepam **OR** LORazepam, ondansetron **OR** ondansetron (ZOFRAN) IV  DVT prophylaxis: SCDs Code Status: Full code Family Communication: No family at bedside Disposition Plan: Home when ready, perhaps within 24 hours  Consultants:   None  Procedures:   None   Antimicrobials:  Vancomycin 5/1   Cefepime 5/1 >>  Metronidazole 5/1 >>    Objective: Vitals:   02/21/19 0421 02/21/19 0616 02/21/19 0817 02/21/19 0830  BP: (!) 103/49 120/75 120/87   Pulse:      Resp: Temp:  98.4 F (36.9 C)   TempSrc:   Oral   SpO2:   97%   Weight:    81.7 kg  Height:     (1.753 m)    Intake/Output Summary (Last 24 hours) at 02/21/2019 1237 Last data filed at 02/21/2019 1100 Gross per 24 hour  Intake 2240 ml  Output 1200 ml  Net 1040 ml   Filed Weights   02/21/19 0830   Weight: 81.7 kg    Examination:  Constitutional: NAD, appears weak Eyes: PERRL, lids and conjunctivae normal ENMT: Mucous membranes are moist.  Respiratory: Diminished breath sounds at the bases, no wheezing or crackles heard.  Normal respiratory effort Cardiovascular: Regular rate and rhythm, no murmurs / rubs / gallops. No LE edema.  Abdomen: no tenderness. Bowel sounds positive.  Musculoskeletal: no clubbing / cyanosis.  Skin: no rashes Neurologic: CN 2-12 grossly intact. Strength 5/5 in all 4.   Data Reviewed: I have independently reviewed following labs and imaging studies   CBC: Recent Labs  Lab 02/14/19 1330 02/19/19 2011 02/19/19 2036 02/20/19 0041 02/20/19 0454 02/21/19 0258  WBC 3.8* 15.4*  --   --  14.1* 5.9  NEUTROABS  --  13.4*  --   --   --   --   HGB 12.3* 14.1 15.0 13.6 14.1 11.9*  HCT 37.8* 46.5 44.0 40.0 43.8 37.6*  MCV 105.0* 111.2*  --   --  106.3* 105.0*  PLT 190 230  --   --  208 177   Basic Metabolic Panel: Recent Labs  Lab 02/14/19 1330 02/19/19 2011 02/19/19 2036 02/19/19 2152 02/20/19 0041 02/20/19 0454 02/21/19 0258  NA 139 138 136  --  139 140 143  K 4.0 5.6* 5.6*  --  5.4* 5.0 4.2  CL 104 105  --   --   --  109 115*  CO2 25 15*  --   --   --  20* 21*  GLUCOSE 84 201*  --   --   --  90 78  BUN 10 16  --   --   --  24* 18  CREATININE 1.14 2.21*  --   --   --  2.28* 1.54*  CALCIUM 9.1 8.1*  --   --   --  7.6* 7.9*  MG  --   --   --  2.4  --   --   --   PHOS  --   --   --  11.2*  --   --  3.3   GFR: Estimated Creatinine Clearance: 56.7 mL/min (A) (by C-G formula based on SCr of 1.54 mg/dL (H)). Liver Function Tests: Recent Labs  Lab 02/14/19 1330 02/19/19 2011 02/20/19 0454 02/21/19 0258  AST 26 410* 345* 181*  ALT 24 324* 279* 163*  ALKPHOS 59 133* 62 50  BILITOT 0.3 0.2* 0.6 0.8  PROT 5.6* 6.0* 6.0* 5.0*  ALBUMIN 3.2* 3.4* 3.3* 2.8*   No results for input(s): LIPASE, AMYLASE in the last 168 hours. Recent Labs  Lab  02/19/19 2340  AMMONIA 12   Coagulation Profile: Recent Labs  Lab 02/20/19 0454  INR 1.1   Cardiac Enzymes: Recent Labs  Lab 02/14/19 1330 02/20/19 0318 02/21/19 0258  CKTOTAL  --  3,177* 2,449*  TROPONINI <0.03  --   --    BNP (last 3 results) No results for input(s): PROBNP in the last 8760 hours. HbA1C: No results for input(s): HGBA1C in the last 72 hours. CBG: Recent Labs  Lab 02/19/19 2001 02/19/19 2116 02/20/19 0145  GLUCAP 56* 142* 81   Lipid Profile: No results for input(s): CHOL, HDL, LDLCALC, TRIG, CHOLHDL, LDLDIRECT in the last 72 hours. Thyroid Function Tests: Recent Labs    02/19/19 2340  TSH 1.396   Anemia Panel: No results for input(s): VITAMINB12, FOLATE, FERRITIN, TIBC, IRON, RETICCTPCT in the last 72 hours. Urine analysis:    Component Value Date/Time   COLORURINE YELLOW 02/20/2019 0205   APPEARANCEUR HAZY (A) 02/20/2019 0205   LABSPEC 1.017 02/20/2019 0205   PHURINE 5.0 02/20/2019 0205   GLUCOSEU 50 (A) 02/20/2019 0205   HGBUR LARGE (A) 02/20/2019 0205   BILIRUBINUR NEGATIVE 02/20/2019 0205   KETONESUR NEGATIVE 02/20/2019 0205   PROTEINUR 30 (A) 02/20/2019 0205   UROBILINOGEN 0.2 03/04/2018 1216   NITRITE NEGATIVE 02/20/2019 0205   LEUKOCYTESUR NEGATIVE 02/20/2019 0205   Sepsis Labs: Invalid input(s): PROCALCITONIN, LACTICIDVEN  Recent Results (from the past 240 hour(s))  SARS Coronavirus 2 [facility] order, Performed in [facility] hospital lab)     Status: None   Collection Time: 02/19/19  8:10 PM  Result Value Ref Range Status   SARS Coronavirus 2 NEGATIVE NEGATIVE Final    Comment: (NOTE) If result is NEGATIVE SARS-CoV-2 target nucleic acids are NOT DETECTED. The SARS-CoV-2 RNA is generally detectable in upper and lower  respiratory specimens during the acute phase of infection. The lowest  concentration of SARS-CoV-2 viral copies this assay can detect is 250  copies / mL. A negative result does not preclude SARS-CoV-2  infection  and should not be used as the sole basis for treatment or other  patient management decisions.  A negative result may occur with  improper specimen collection / handling, submission of specimen other  than nasopharyngeal swab, presence of viral mutation(s) within the  areas targeted by this assay, and inadequate number of viral copies  (<250 copies / mL). A negative result must be combined with clinical  observations, patient history, and epidemiological information. If result is POSITIVE SARS-CoV-2 target nucleic acids are DETECTED. The SARS-CoV-2 RNA is generally detectable in upper and lower  respiratory specimens dur ing the acute phase of infection.  Positive  results are indicative of active infection with SARS-CoV-2.  Clinical  correlation with patient history and other diagnostic information is  necessary to determine patient infection status.  Positive results do  not rule out bacterial infection or co-infection with other viruses. If result is PRESUMPTIVE POSTIVE SARS-CoV-2 nucleic acids MAY BE PRESENT.   A presumptive positive result was obtained on the submitted specimen  and confirmed on repeat testing.  While 2019 novel coronavirus  (SARS-CoV-2) nucleic acids may be present in the submitted sample  additional confirmatory testing may be necessary for epidemiological  and / or clinical management purposes  to differentiate between  SARS-CoV-2 and other Sarbecovirus currently known to infect humans.  If clinically indicated additional testing with an alternate test  methodology [phone]) is advised. The SARS-CoV-2 RNA is generally  detectable in upper and lower respiratory sp ecimens during the acute  phase of infection. The expected result is Negative. Fact Sheet for Patients:  BoilerBrush.com.cy Fact Sheet for Healthcare Providers: https://pope.com/ This test is not yet approved or cleared by the [place]  FDA and has been authorized for detection and/or diagnosis of SARS-CoV-2 by FDA under an Emergency Use Authorization (EUA).  This EUA will remain in effect (meaning this test can be used) for the duration of the COVID-19 declaration under Section 564(b)(1)  of the Act, 21 U.S.C. section 360bbb-3(b)(1), unless the authorization is terminated or revoked sooner. Performed at [facility], [address] [place], [place] [address]   Blood Culture (routine x 2)     Status: None (Preliminary result)   Collection Time: 02/19/19  8:20 PM  Result Value Ref Range Status   Specimen Description BLOOD RIGHT UPPER ARM  Final   Special Requests   Final    BOTTLES DRAWN AEROBIC AND ANAEROBIC Blood Culture results may not be optimal due to an inadequate volume of blood received in culture bottles   Culture   Final    NO GROWTH 2 DAYS Performed at [facility], [address] [place], [place] [address]    Report Status PENDING  Incomplete  Blood Culture (routine x 2)     Status: None (Preliminary result)   Collection Time: 02/19/19  8:31 PM  Result Value Ref Range Status   Specimen Description BLOOD RIGHT ANTECUBITAL  Final   Special Requests   Final    BOTTLES DRAWN AEROBIC AND ANAEROBIC Blood Culture results may not be optimal due to an inadequate volume of blood received in culture bottles   Culture   Final    NO GROWTH 2 DAYS Performed at [facility], [address] [place], [place] [address]    Report Status PENDING  Incomplete  C difficile quick scan w PCR reflex     Status: None   Collection Time: 02/20/19  1:00 AM  Result Value Ref Range Status   C Diff antigen NEGATIVE NEGATIVE Final   C Diff toxin NEGATIVE NEGATIVE Final   C Diff interpretation No C. difficile detected.  Final    Comment: Performed at [facility], [address] [place], [place] [address]  MRSA PCR Screening     Status: None   Collection Time: 02/20/19  5:30 AM  Result Value Ref Range  Status   MRSA by PCR NEGATIVE NEGATIVE Final    Comment:        The GeneXpert MRSA Assay (FDA approved for NASAL specimens only), is one component of a comprehensive MRSA colonization surveillance program. It is not intended to diagnose MRSA infection nor to guide or monitor treatment for MRSA infections. Performed at [facility], [address] [place], [place] [address]       Radiology Studies: Ct Angio Head W Or Wo Contrast  Result Date: 02/19/2019 CLINICAL DATA:  53 year old male found unresponsive by family. Left side weakness. EXAM: CT ANGIOGRAPHY HEAD AND NECK TECHNIQUE: Multidetector CT imaging of the head and neck was performed using the standard protocol during bolus administration of intravenous contrast. Multiplanar CT image reconstructions and MIPs were obtained to evaluate the vascular anatomy. Carotid stenosis measurements (when applicable) are obtained utilizing NASCET criteria, using the distal internal carotid diameter as the denominator. CONTRAST:  75mL OMNIPAQUE IOHEXOL 350 MG/ML SOLN COMPARISON:  Head and cervical spine CT 10/06/2018. FINDINGS: CT HEAD Brain: Normal cerebral volume. No midline shift, ventriculomegaly, mass effect, evidence of mass lesion, intracranial hemorrhage or evidence of cortically based acute infarction. Gray-white matter differentiation is within normal limits throughout the brain. ASPECTS 10. These results were communicated to Dr. Laurence SlateAroor at 9:07 pm on 02/19/2019 by text page via the [facility] messaging system. Calvarium and skull base: Stable and intact. Paranasal sinuses: Stable bilateral mild to moderate mucosal thickening. Tympanic cavities and mastoids remain well pneumatized. Orbits: Mildly Disconjugate gaze, otherwise negative orbit and scalp  soft tissues. CTA NECK Skeleton: Advanced degenerative changes in the cervical spine appears stable since December. No acute osseous abnormality identified. Upper chest: Dependent atelectasis. Mild  paraseptal emphysema in the lung apices. No superior mediastinal lymphadenopathy. Other neck: Negative; no neck mass or lymphadenopathy. Aortic arch: 3 vessel arch configuration. No arch atherosclerosis. Right carotid system: Minimal plaque at the lateral right ICA origin. Mild motion artifact just distal to the bulb. No stenosis. Mildly beaded appearance of the right ICA at the C1 level on series 13, image 17. Possible similar beading further down although motion artifact in that segment. Left carotid system: Negative left CCA. Mild soft and calcified plaque at the lateral left ICA origin without stenosis. Small innominate vessel arises from the medial left ICA bulb on series 8, image 94 (normal variant). Motion artifact just distal to the bulb. No left ICA stenosis. Questionable mildly beaded appearance, less pronounced than on the right. Vertebral arteries: Normal proximal right subclavian and right vertebral artery origin. Right vertebral is patent to the skull base without stenosis. Normal proximal left subclavian and left vertebral origin. Left vertebral artery is mildly tortuous and patent to the skull base without stenosis. CTA HEAD Posterior circulation: Fairly codominant distal vertebral arteries with no stenosis to the basilar. Patent right PICA origin. The left AICA may be dominant. Patent basilar artery without stenosis. Patent SCA and PCA origins. Both posterior communicating arteries are present. Bilateral PCA branches are within normal limits. Anterior circulation: Both ICA siphons are patent. On the left there may be mild soft plaque, but no significant stenosis. On the right there is no convincing plaque or stenosis. Normal posterior communicating artery origins. Patent carotid termini. Normal MCA and ACA origins. Diminutive anterior communicating artery. Bilateral ACA branches are within normal limits. Left MCA M1 and bifurcation are patent without stenosis. Left MCA branches are within normal  limits. Right MCA M1 bifurcates early. No bifurcation stenosis. Right MCA branches are within normal limits. Venous sinuses: Patent. Anatomic variants: Small innominate branch arises from the medial left ICA bulb. Delayed phase: No abnormal enhancement identified. Review of the MIP images confirms the above findings IMPRESSION: 1. Negative for large vessel occlusion. No arterial stenosis identified in the head or neck. 2. Stable and normal CT appearance of the brain. 3. 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 4. Advanced cervical spine degeneration appears stable. These results were communicated to Dr. Amada Jupiter at 9:18 pm on 02/19/2019 by text page via the [facility] messaging system. Electronically Signed   By: Odessa Fleming M.D.   On: 02/19/2019 21:20   Ct Angio Neck W Or Wo Contrast  Result Date: 02/19/2019 CLINICAL DATA:  53 year old male found unresponsive by family. Left side weakness. EXAM: CT ANGIOGRAPHY HEAD AND NECK TECHNIQUE: Multidetector CT imaging of the head and neck was performed using the standard protocol during bolus administration of intravenous contrast. Multiplanar CT image reconstructions and MIPs were obtained to evaluate the vascular anatomy. Carotid stenosis measurements (when applicable) are obtained utilizing NASCET criteria, using the distal internal carotid diameter as the denominator. CONTRAST:  75mL OMNIPAQUE IOHEXOL 350 MG/ML SOLN COMPARISON:  Head and cervical spine CT 10/06/2018. FINDINGS: CT HEAD Brain: Normal cerebral volume. No midline shift, ventriculomegaly, mass effect, evidence of mass lesion, intracranial hemorrhage or evidence of cortically based acute infarction. Gray-white matter differentiation is within normal limits throughout the brain. ASPECTS 10. These results were communicated to Dr. Laurence Slate at 9:07 pm on 02/19/2019 by text page via the [facility]  messaging system. Calvarium and skull base: Stable and intact. Paranasal sinuses:  Stable bilateral mild to moderate mucosal thickening. Tympanic cavities and mastoids remain well pneumatized. Orbits: Mildly Disconjugate gaze, otherwise negative orbit and scalp soft tissues. CTA NECK Skeleton: Advanced degenerative changes in the cervical spine appears stable since December. No acute osseous abnormality identified. Upper chest: Dependent atelectasis. Mild paraseptal emphysema in the lung apices. No superior mediastinal lymphadenopathy. Other neck: Negative; no neck mass or lymphadenopathy. Aortic arch: 3 vessel arch configuration. No arch atherosclerosis. Right carotid system: Minimal plaque at the lateral right ICA origin. Mild motion artifact just distal to the bulb. No stenosis. Mildly beaded appearance of the right ICA at the C1 level on series 13, image 17. Possible similar beading further down although motion artifact in that segment. Left carotid system: Negative left CCA. Mild soft and calcified plaque at the lateral left ICA origin without stenosis. Small innominate vessel arises from the medial left ICA bulb on series 8, image 94 (normal variant). Motion artifact just distal to the bulb. No left ICA stenosis. Questionable mildly beaded appearance, less pronounced than on the right. Vertebral arteries: Normal proximal right subclavian and right vertebral artery origin. Right vertebral is patent to the skull base without stenosis. Normal proximal left subclavian and left vertebral origin. Left vertebral artery is mildly tortuous and patent to the skull base without stenosis. CTA HEAD Posterior circulation: Fairly codominant distal vertebral arteries with no stenosis to the basilar. Patent right PICA origin. The left AICA may be dominant. Patent basilar artery without stenosis. Patent SCA and PCA origins. Both posterior communicating arteries are present. Bilateral PCA branches are within normal limits. Anterior circulation: Both ICA siphons are patent. On the left there may be mild soft  plaque, but no significant stenosis. On the right there is no convincing plaque or stenosis. Normal posterior communicating artery origins. Patent carotid termini. Normal MCA and ACA origins. Diminutive anterior communicating artery. Bilateral ACA branches are within normal limits. Left MCA M1 and bifurcation are patent without stenosis. Left MCA branches are within normal limits. Right MCA M1 bifurcates early. No bifurcation stenosis. Right MCA branches are within normal limits. Venous sinuses: Patent. Anatomic variants: Small innominate branch arises from the medial left ICA bulb. Delayed phase: No abnormal enhancement identified. Review of the MIP images confirms the above findings IMPRESSION: 1. Negative for large vessel occlusion. No arterial stenosis identified in the head or neck. 2. Stable and normal CT appearance of the brain. 3. 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 4. Advanced cervical spine degeneration appears stable. These results were communicated to Dr. Amada Jupiter at 9:18 pm on 02/19/2019 by text page via the [facility] messaging system. Electronically Signed   By: Odessa Fleming M.D.   On: 02/19/2019 21:20   Ct C-spine No Charge  Result Date: 02/19/2019 CLINICAL DATA:  Found on the ground. Unresponsive. Left-sided weakness. EXAM: CT CERVICAL SPINE WITHOUT CONTRAST TECHNIQUE: Multidetector CT imaging of the cervical spine was performed without intravenous contrast. Multiplanar CT image reconstructions were also generated. COMPARISON:  10/08/2018 FINDINGS: Alignment: No change since the previous study. Kyphotic curvature of the cervical spine. 2 mm anterolisthesis C3-4. 2 mm retrolisthesis C6-7. No acute or traumatic malalignment. Skull base and vertebrae: No regional fracture. Soft tissues and spinal canal: Negative Disc levels:  Foramen magnum and C1-2 are unremarkable. C2-3: Anterior osteophytes as seen previously. Mild facet osteoarthritis. No stenosis. C3-4:  2 mm anterolisthesis. Advanced chronic facet arthropathy on the left. Left  foraminal stenosis. C4-5: Spondylosis with disc space narrowing. Uncovertebral osteophytes. Bilateral foraminal narrowing. C5-6: Spondylosis. Osteophytic narrowing of the foramen on the left. C6-7: Spondylosis. Osteophytic narrowing of the foramen on the right. C7-T1: Disc degeneration with disc space narrowing. No likely compressive stenosis. Mild foraminal narrowing on the left. Upper chest: Only the extreme lung apices are seen. There is bullous emphysema. Pneumonia or edema not excluded. Other: None IMPRESSION: No acute or traumatic finding. No change since the study of last year. Kyphotic curvature in the cervical spine. Degenerative spondylosis and facet arthropathy. Electronically Signed   By: Paulina Fusi M.D.   On: 02/19/2019 21:33   Dg Chest Portable 1 View  Result Date: 02/19/2019 CLINICAL DATA:  Initial evaluation for acute overdose. EXAM: PORTABLE CHEST 1 VIEW COMPARISON:  Prior radiograph from 02/14/2019. FINDINGS: Mild cardiomegaly, stable. Mediastinal silhouette within normal limits. Lungs mildly hypoinflated. Persistent patchy and hazy right lower lobe opacity, which could reflect atelectasis, infiltrate, or aspiration. Left lung is largely clear. Mild perihilar vascular congestion without overt pulmonary edema. No appreciable pleural effusion. No pneumothorax. No acute osseous finding. Metallic BB overlies the left upper quadrant, stable. IMPRESSION: Patchy and hazy right lower lobe opacity, which could reflect atelectasis, infiltrate, or aspiration. Electronically Signed   By: Rise Mu M.D.   On: 02/19/2019 20:24   Pamella Pert, MD, PhD [facility]  Contact via  [email or web address]  [facility] Office Info P: [phone]  F: [phone]
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
## 2019-02-22 LAB — BASIC METABOLIC PANEL
Anion gap: 9 (ref 5–15)
BUN: 9 mg/dL (ref 6–20)
CO2: 23 mmol/L (ref 22–32)
Calcium: 8.4 mg/dL — ABNORMAL LOW (ref 8.9–10.3)
Chloride: 109 mmol/L (ref 98–111)
Creatinine, Ser: 1.22 mg/dL (ref 0.61–1.24)
GFR calc Af Amer: >60 mL/min (ref >60)
GFR calc non Af Amer: >60 mL/min (ref >60)
Glucose, Bld: 92 mg/dL (ref 70–99)
Potassium: 3.9 mmol/L (ref 3.5–5.1)
Sodium: 141 mmol/L (ref 135–145)

## 2019-02-22 LAB — CBC
HCT: 37.5 % — ABNORMAL LOW (ref 39.0–52.0)
Hemoglobin: 12.2 g/dL — ABNORMAL LOW (ref 13.0–17.0)
MCH: 34.4 pg — ABNORMAL HIGH (ref 26.0–34.0)
MCHC: 32.5 g/dL (ref 30.0–36.0)
MCV: 105.6 fL — ABNORMAL HIGH (ref 80.0–100.0)
Platelets: 121 10*3/uL — ABNORMAL LOW (ref 150–400)
RBC: 3.55 MIL/uL — ABNORMAL LOW (ref 4.22–5.81)
RDW: 14 % (ref 11.5–15.5)
WBC: 5.3 10*3/uL (ref 4.0–10.5)
nRBC: 0 % (ref 0.0–0.2)

## 2019-02-22 LAB — LEVETIRACETAM LEVEL: Levetiracetam Lvl: <1 ug/mL — ABNORMAL LOW (ref 10.0–40.0)

## 2019-02-22 MED ORDER — LEVETIRACETAM 500 MG PO TABS
1000.0000 mg | ORAL_TABLET | Freq: Two times a day (BID) | ORAL | Status: DC
  Administered 2019-02-22 – 2019-02-24 (×4): 1000 mg via ORAL
  Filled 2019-02-22 (×4): qty 2

## 2019-02-22 NOTE — Plan of Care (Signed)

## 2019-02-22 NOTE — Progress Notes (Addendum)
PT Cancellation Note  Patient Details Name: David Benson MRN: [id number] DOB: Jun 20, 1966   Cancelled Treatment:    Reason Eval/Treat Not Completed: PT screened, no needs identified, will sign off. Per RN, pt ambulating and performing ADLs independently. Please reconsult if new needs arise.  Ina Homes, PT, DPT Acute Rehabilitation Services  Pager [phone] Office [phone]  Malachy Chamber 02/22/2019, 4:51 PM

## 2019-02-22 NOTE — TOC Progression Note (Signed)
Transition of Care [facility]) - Progression Note    Patient Details  Name: AUREN HEINZE MRN: [id number] Date of Birth: 1966-05-25  [facility] CM/SW Contact  Eduard Roux, [place] Phone Number: 02/22/2019, 1:02 PM  Clinical Narrative:     CSW unable to complete assessment - patient was sleep.  Antony Blackbird, MSW, LCSWA Clinical Social Worker [phone]         Expected Discharge Plan and Services                                                 Social Determinants of Health (SDOH) Interventions    Readmission Risk Interventions No flowsheet data found.

## 2019-02-22 NOTE — Progress Notes (Signed)
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 compared to most recent study. No fracture or dislocation. There is pes planus. Electronically Signed   By: Bretta Bang III M.D.   On: 02/06/2019 13:40     CBC Recent Labs  Lab 02/19/19 2011 02/19/19 2036 02/20/19 0041 02/20/19 0454 02/21/19 0258 02/22/19 0221  WBC 15.4*  --   --  14.1* 5.9 5.3  HGB 14.1 15.0 13.6 14.1 11.9* 12.2*  HCT 46.5 44.0 40.0 43.8 37.6* 37.5*  PLT 230  --   --  208 177 121*  MCV 111.2*  --   --  106.3* 105.0* 105.6*  MCH 33.7  --   --  34.2* 33.2 34.4*  MCHC 30.3  --   --  32.2 31.6 32.5  RDW 14.3  --   --  14.4 14.5 14.0  LYMPHSABS 0.9  --   --   --   --   --   MONOABS 1.0  --   --   --   --   --   EOSABS 0.0  --   --   --   --   --  BASOSABS 0.0  --   --   --   --   --     Chemistries  Recent Labs  Lab 02/19/19 2011 02/19/19 2036 02/19/19 2152 02/20/19 0041 02/20/19 0454 02/21/19 0258 02/22/19 0221  NA 138 136  --  139 140 143 141    K 5.6* 5.6*  --  5.4* 5.0 4.2 3.9  CL 105  --   --   --  109 115* 109  CO2 15*  --   --   --  20* 21* 23  GLUCOSE 201*  --   --   --  90 78 92  BUN 16  --   --   --  24* 18 9  CREATININE 2.21*  --   --   --  2.28* 1.54* 1.22  CALCIUM 8.1*  --   --   --  7.6* 7.9* 8.4*  MG  --   --  2.4  --   --   --   --   AST 410*  --   --   --  345* 181*  --   ALT 324*  --   --   --  279* 163*  --   ALKPHOS 133*  --   --   --  62 50  --   BILITOT 0.2*  --   --   --  0.6 0.8  --    ------------------------------------------------------------------------------------------------------------------ No results for input(s): CHOL, HDL, LDLCALC, TRIG, CHOLHDL, LDLDIRECT in the last 72 hours.  Lab Results  Component Value Date   HGBA1C 6.3 (H) 05/19/2012   ------------------------------------------------------------------------------------------------------------------ Recent Labs    02/19/19 2340  TSH 1.396   ------------------------------------------------------------------------------------------------------------------ No results for input(s): VITAMINB12, FOLATE, FERRITIN, TIBC, IRON, RETICCTPCT in the last 72 hours.  Coagulation profile Recent Labs  Lab 02/20/19 0454  INR 1.1    No results for input(s): DDIMER in the last 72 hours.  Cardiac Enzymes No results for input(s): CKMB, TROPONINI, MYOGLOBIN in the last 168 hours.  Invalid input(s): CK ------------------------------------------------------------------------------------------------------------------ No results found for: BNP   Shon Hale M.D on 02/22/2019 at 4:56 PM  Go to [email or web address] - for contact info  [facility] - Office  [phone]

## 2019-02-22 NOTE — Progress Notes (Signed)
Patient ID: David Benson, male   DOB: 1966/01/16, 53 y.o.   MRN: [id number] Flexiseal dc'd. of stool in bag. Mushy brown. Patient ambulated to the bathroom. Independent. Called and ordered breakfast. Meds taken without issues. Will continue to follow as needed.  Renaldo Reel Katrinka Blazing, MSN, RN, [employer]

## 2019-02-23 ENCOUNTER — Inpatient Hospital Stay (HOSPITAL_COMMUNITY): Payer: Self-pay

## 2019-02-23 DIAGNOSIS — R9431 Abnormal electrocardiogram [ECG] [EKG]: Secondary | ICD-10-CM

## 2019-02-23 DIAGNOSIS — R001 Bradycardia, unspecified: Secondary | ICD-10-CM

## 2019-02-23 LAB — CBC
HCT: 33.9 % — ABNORMAL LOW (ref 39.0–52.0)
Hemoglobin: 11.1 g/dL — ABNORMAL LOW (ref 13.0–17.0)
MCH: 33.6 pg (ref 26.0–34.0)
MCHC: 32.7 g/dL (ref 30.0–36.0)
MCV: 102.7 fL — ABNORMAL HIGH (ref 80.0–100.0)
Platelets: 153 10*3/uL (ref 150–400)
RBC: 3.3 MIL/uL — ABNORMAL LOW (ref 4.22–5.81)
RDW: 13.3 % (ref 11.5–15.5)
WBC: 4.3 10*3/uL (ref 4.0–10.5)
nRBC: 0 % (ref 0.0–0.2)

## 2019-02-23 LAB — COMPREHENSIVE METABOLIC PANEL
ALT: 69 U/L — ABNORMAL HIGH (ref 0–44)
AST: 45 U/L — ABNORMAL HIGH (ref 15–41)
Albumin: 2.6 g/dL — ABNORMAL LOW (ref 3.5–5.0)
Alkaline Phosphatase: 42 U/L (ref 38–126)
Anion gap: 7 (ref 5–15)
BUN: 6 mg/dL (ref 6–20)
CO2: 24 mmol/L (ref 22–32)
Calcium: 8.2 mg/dL — ABNORMAL LOW (ref 8.9–10.3)
Chloride: 109 mmol/L (ref 98–111)
Creatinine, Ser: 1.11 mg/dL (ref 0.61–1.24)
GFR calc Af Amer: >60 mL/min (ref >60)
GFR calc non Af Amer: >60 mL/min (ref >60)
Glucose, Bld: 99 mg/dL (ref 70–99)
Potassium: 3.7 mmol/L (ref 3.5–5.1)
Sodium: 140 mmol/L (ref 135–145)
Total Bilirubin: 0.9 mg/dL (ref 0.3–1.2)
Total Protein: 4.7 g/dL — ABNORMAL LOW (ref 6.5–8.1)

## 2019-02-23 LAB — CK: Total CK: 271 U/L (ref 49–397)

## 2019-02-23 LAB — ECHOCARDIOGRAM LIMITED
Height: 69 in
Weight: 2881.47 oz

## 2019-02-23 MED ORDER — METRONIDAZOLE 500 MG PO TABS
500.0000 mg | ORAL_TABLET | Freq: Three times a day (TID) | ORAL | Status: DC
  Administered 2019-02-23 – 2019-02-24 (×3): 500 mg via ORAL
  Filled 2019-02-23 (×3): qty 1

## 2019-02-23 NOTE — Progress Notes (Signed)
Patient's HR 35-40 sustained. He verbalized that he has problems breathing. Staff started oxygen via [place] at 2L. MD made aware. Will continue to monitor.
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
## 2019-02-23 NOTE — Progress Notes (Signed)
CCMD called staff and informed that patient's HR is now between junctional and brady rhythm. MD made aware. Patient is resting in bed; no complaints at this time. Will continue to monitor.

## 2019-02-23 NOTE — Progress Notes (Signed)
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 07, 2018 FINDINGS: Frontal, oblique, and lateral views were obtained. No evident acute fracture or dislocation. There is slight increase in narrowing of the first MTP joint. There is also osteoarthritic change in the medial cuneiform-first metatarsal joint. There is spurring throughout the dorsal midfoot, also present previously. No erosions. There is pes planus. 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 Slight increase in narrowing of the first MTP joint compared to most recent study. No fracture or dislocation. There is pes planus. Electronically Signed   By: Bretta Bang III M.D.   On: 02/06/2019 13:40     CBC Recent Labs  Lab 02/19/19 2011  02/20/19 0041 02/20/19 0454 02/21/19 0258 02/22/19 0221 02/23/19 0347  WBC 15.4*  --   --  14.1* 5.9 5.3 4.3  HGB 14.1   < > 13.6 14.1 11.9* 12.2* 11.1*  HCT 46.5   < > 40.0 43.8 37.6* 37.5* 33.9*  PLT 230  --   --   208 177 121* 153  MCV 111.2*  --   --  106.3* 105.0* 105.6*  102.7*  MCH 33.7  --   --  34.2* 33.2 34.4* 33.6  MCHC 30.3  --   --  32.2 31.6 32.5 32.7  RDW 14.3  --   --  14.4 14.5 14.0 13.3  LYMPHSABS 0.9  --   --   --   --   --   --   MONOABS 1.0  --   --   --   --   --   --   EOSABS 0.0  --   --   --   --   --   --   BASOSABS 0.0  --   --   --   --   --   --    < > = values in this interval not displayed.    Chemistries  Recent Labs  Lab 02/19/19 2011  02/19/19 2152 02/20/19 0041 02/20/19 0454 02/21/19 0258 02/22/19 0221 02/23/19 0347  NA 138   < >  --  139 140 143 141 140  K 5.6*   < >  --  5.4* 5.0 4.2 3.9 3.7  CL 105  --   --   --  109 115* 109 109  CO2 15*  --   --   --  20* 21* 23 24  GLUCOSE 201*  --   --   --  90 78 92 99  BUN 16  --   --   --  24* 18 9 6   CREATININE 2.21*  --   --   --  2.28* 1.54* 1.22 1.11  CALCIUM 8.1*  --   --   --  7.6* 7.9* 8.4* 8.2*  MG  --   --  2.4  --   --   --   --   --   AST 410*  --   --   --  345* 181*  --  45*  ALT 324*  --   --   --  279* 163*  --  69*  ALKPHOS 133*  --   --   --  62 50  --  42  BILITOT 0.2*  --   --   --  0.6 0.8  --  0.9   < > = values in this interval not displayed.   ------------------------------------------------------------------------------------------------------------------ No results for input(s): CHOL, HDL, LDLCALC, TRIG, CHOLHDL, LDLDIRECT in the last 72 hours.  Lab Results  Component Value Date   HGBA1C 6.3 (H) 05/19/2012   ------------------------------------------------------------------------------------------------------------------ No results for input(s): TSH, T4TOTAL, T3FREE, THYROIDAB in the last 72 hours.  Invalid input(s): FREET3 ------------------------------------------------------------------------------------------------------------------ No results for input(s): VITAMINB12, FOLATE, FERRITIN, TIBC, IRON, RETICCTPCT in the last 72 hours.  Coagulation profile Recent Labs  Lab  02/20/19 0454  INR 1.1    No results for input(s): DDIMER in the last 72 hours.  Cardiac Enzymes No results for input(s): CKMB, TROPONINI, MYOGLOBIN in the last 168 hours.  Invalid input(s): CK ------------------------------------------------------------------------------------------------------------------ No results found for: BNP   Shon Hale M.D on 02/23/2019 at 8:33 PM  Go to [email or web address] - for contact info  [facility] - Office  [phone]

## 2019-02-23 NOTE — Progress Notes (Signed)
Pharmacy is unable to confirm the medications the patient was taking at home. All options have been exhausted and a resolution to the situation is not expected.   Where possible, their outpatient pharmacy(s) have been contacted for the last time prescriptions were filled and that information has been added to each medication in an Order Note (highlighted yellow below the medication).  Please contact pharmacy if further assistance is needed.   Charolotte Eke, PharmD. Mobile: [phone]. 02/23/2019,8:35 AM.

## 2019-02-23 NOTE — Plan of Care (Signed)
  Problem: Education: Goal: Knowledge of General Education information will improve Description Including pain rating scale, medication(s)/side effects and non-pharmacologic comfort measures Outcome: Progressing Note:  POC and pain management reviewed with pt.   

## 2019-02-23 NOTE — Progress Notes (Signed)
  Echocardiogram 2D Echocardiogram has been performed.  David Benson 02/23/2019, 1:41 PM

## 2019-02-23 NOTE — Progress Notes (Signed)
CCMD called- pt.'s HR 34-38, nonsustained; HR mainly in the 40's; pt. resting; Bodenheimer,NP paged and notified.

## 2019-02-24 LAB — CULTURE, BLOOD (ROUTINE X 2)
Culture: NO GROWTH
Culture: NO GROWTH

## 2019-02-24 MED ORDER — THIAMINE HCL 100 MG PO TABS: 100.0000 mg | ORAL_TABLET | Freq: Every day | ORAL | 1 refills | Status: DC

## 2019-02-24 MED ORDER — FOLIC ACID 1 MG PO TABS: 1.0000 mg | ORAL_TABLET | Freq: Every day | ORAL | 2 refills | Status: DC

## 2019-02-24 MED ORDER — IBUPROFEN 600 MG PO TABS: 600.0000 mg | ORAL_TABLET | Freq: Four times a day (QID) | ORAL | 0 refills | Status: DC | PRN

## 2019-02-24 MED ORDER — LEVETIRACETAM 1000 MG PO TABS: 1000.0000 mg | ORAL_TABLET | Freq: Two times a day (BID) | ORAL | 4 refills | Status: DC

## 2019-02-24 MED ORDER — TRAMADOL HCL 50 MG PO TABS
50.0000 mg | ORAL_TABLET | Freq: Once | ORAL | Status: DC
  Filled 2019-02-24: qty 1

## 2019-02-24 MED ORDER — ALBUTEROL SULFATE HFA 108 (90 BASE) MCG/ACT IN AERS: 2.0000 | INHALATION_SPRAY | Freq: Four times a day (QID) | RESPIRATORY_TRACT | 0 refills | Status: DC | PRN

## 2019-02-24 MED ORDER — LEVOFLOXACIN 750 MG PO TABS: 750.0000 mg | ORAL_TABLET | Freq: Every day | ORAL | 0 refills | Status: DC

## 2019-02-24 MED ORDER — ADULT MULTIVITAMIN W/MINERALS CH: 1.0000 | ORAL_TABLET | Freq: Every day | ORAL | 0 refills | Status: DC

## 2019-02-24 NOTE — Progress Notes (Signed)
Page NP Craige Cotta 0210 about no PRN pain meds. One time tramadol added. Not given as patient asleep during attempts to administer medication

## 2019-02-24 NOTE — Progress Notes (Signed)
SATURATION QUALIFICATIONS: (This note is used to comply with regulatory documentation for home oxygen)  Patient Saturations on Room Air at Rest = 96%  Patient Saturations on Room Air while Ambulating = 82%  Patient Saturations on 2 Liters of oxygen while Ambulating = 98%  Please briefly explain why patient needs home oxygen: To maintain sats >90%.  Nicola Police, PT Acute Rehabilitation Services Pager [phone]  Office [phone]

## 2019-02-24 NOTE — Progress Notes (Signed)
   Anticipate patient will need oxygen for 1 month  Please give oxygen via nasal cannula at 2 L/min continuously with gaseous portability and conserving device  Patient Saturations on Room Air at Rest = 96%  Patient Saturations on Room Air while Ambulating = 82%  Patient Saturations on 2 Liters of oxygen while Ambulating = 98%  Please briefly explain why patient needs home oxygen: To maintain sats >90%.  Please give oxygen via nasal cannula at 2 L/min continuously with gaseous portability and conserving device  Anticipate patient will need oxygen for 1 month

## 2019-02-24 NOTE — TOC Initial Note (Signed)
Transition of Care [facility]) - Initial/Assessment Note    Patient Details  Name: David Benson MRN: [id number] Date of Birth: 11-May-1966  Transition of Care [facility]) CM/SW Contact:    Mearl Latin, LCSW Phone Number: 02/24/2019, 1:47 PM  Clinical Narrative:                 CSW received consult regarding substance use resources. Patient reported that he would like assistance with cessation, though he has been given information on it before. He reports that his family is supportive. CSW provided resources.   Expected Discharge Plan: Home/Self Care Barriers to Discharge: No Barriers Identified   Patient Goals and CMS Choice Patient states their goals for this hospitalization and ongoing recovery are:: Feel better   Choice offered to / list presented to : NA  Expected Discharge Plan and Services Expected Discharge Plan: Home/Self Care   Discharge Planning Services: NA Post Acute Care Choice: NA Living arrangements for the past 2 months: Apartment Expected Discharge Date: 02/24/19               DME Arranged: N/A DME Agency: NA       HH Arranged: NA HH Agency: NA        Prior Living Arrangements/Services Living arrangements for the past 2 months: Apartment Lives with:: Spouse Patient language and need for interpreter reviewed:: Yes Do you feel safe going back to the place where you live?: Yes      Need for Family Participation in Patient Care: No (Comment) Care giver support system in place?: Yes (comment)   Criminal Activity/Legal Involvement Pertinent to Current Situation/Hospitalization: No - Comment as needed  Activities of Daily Living Home Assistive Devices/Equipment: None ADL Screening (condition at time of admission) Patient's cognitive ability adequate to safely complete daily activities?: Yes Is the patient deaf or have difficulty hearing?: No Does the patient have difficulty seeing, even when wearing glasses/contacts?: No Does the patient have difficulty  concentrating, remembering, or making decisions?: Yes Patient able to express need for assistance with ADLs?: Yes Does the patient have difficulty dressing or bathing?: Yes Independently performs ADLs?: No Communication: Independent Dressing (OT): Needs assistance Is this a change from baseline?: Change from baseline, expected to last <3days Grooming: Needs assistance Is this a change from baseline?: Change from baseline, expected to last <3 days Feeding: Independent Bathing: Needs assistance Is this a change from baseline?: Change from baseline, expected to last <3 days Toileting: Needs assistance Is this a change from baseline?: Change from baseline, expected to last <3 days In/Out Bed: Needs assistance Is this a change from baseline?: Change from baseline, expected to last <3 days Walks in Home: Independent Does the patient have difficulty walking or climbing stairs?: Yes Weakness of Legs: Both Weakness of Arms/Hands: Both  Permission Sought/Granted                  Emotional Assessment Appearance:: Appears stated age Attitude/Demeanor/Rapport: Gracious Affect (typically observed): Appropriate Orientation: : Oriented to Self, Oriented to Place, Oriented to  Time, Oriented to Situation Alcohol / Substance Use: Alcohol Use, Illicit Drugs Psych Involvement: No (comment)  Admission diagnosis:  Lactic acidosis [E87.2] Transaminitis [R74.0] Left-sided weakness [R53.1] Seizure-like activity (HCC) [R56.9] Altered mental status, unspecified altered mental status type [R41.82] Patient Active Problem List   Diagnosis Date Noted  . Aspiration pneumonia (HCC) 02/20/2019  . Lactic acid acidosis 02/20/2019  . Sepsis, Gram negative (HCC) 02/20/2019  . Acute renal failure (ARF) (HCC) 02/20/2019  . Alcoholic  hepatitis without ascites 02/20/2019  . Post-ictal state (HCC) 10/06/2018  . SBO (small bowel obstruction) (HCC) 08/10/2016  . Polysubstance abuse (HCC) 08/10/2016  . 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 Seizure disorder (HCC) 11/23/2015  . Poor dentition 11/23/2015  . Macrocytic anemia   . Dysphagia   . Dyshidrotic eczema 06/11/2012  . Tobacco abuse 06/10/2012  . Oral candidiasis 05/20/2012  . Tinea corporis 05/20/2012  . 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 Alcohol abuse 01/24/2010  . DEPRESSION 01/24/2010  . COPD 01/24/2010  . GERD 01/24/2010  . OSTEOARTHRITIS 01/24/2010  . Personal history of other disorders of nervous system and sense organs 01/24/2010   PCP:  Patient, No Pcp Per Pharmacy:   [facility] [address] - [place] ([address]), [place] - [address] [place] [address] Phone: [phone] Fax: [phone]  [facility] [place] [place] [address] Phone: [phone] Fax: [phone]     Social Determinants of Health (SDOH) Interventions    Readmission Risk Interventions Readmission Risk Prevention Plan 02/24/2019  Transportation Screening Complete  PCP or Specialist Appt within 3-5 Days Complete  HRI or Home Care Consult Complete  Social Work Consult for Recovery Care Planning/Counseling Complete  Palliative Care Screening Not Applicable  Medication Review Oceanographer(RN Care Manager) Complete  Some recent data might be hidden

## 2019-02-24 NOTE — TOC Progression Note (Signed)
Transition of Care [facility]) - Progression Note    Patient Details  Name: David Benson MRN: [id number] Date of Birth: [birth year]  Transition of [facility]) CM/SW Contact  Epifanio Lesches, RN Phone Number: 02/24/2019, 3:46 PM  Clinical Narrative:    Admitted 02/19/19 after found down by family, found to have multiple possible seizures/PNA. Hx of polysubstance abuse, and severe sepsis; rhabdomyolysis. NCM noted orders for DME needs and home health services. Pt without health insurance. States jobless.From home with mom, grandmother and wife.  NCM reached out to [employer]/[facility] [phone]) for oxygen, rolling walker and BSC , charity case. NCM also reached out to [facility]/ [facility] [phone]) for home health services ( PT), charity case.  DME  and home health needs are both awaiting charity  Approval.  Match Letter vs good Rx card to assist with med cost.  Pt without PCP, NCM scheduled post hospital f/u with [facility], noted on AVS and explained to pt.  Pt does state has transportation to home.  Expected Discharge Plan: Home w Home Health Services(lives with mom) Barriers to Discharge: Barriers Resolved  Expected Discharge Plan and Services Expected Discharge Plan: Home w Home Health Services(lives with mom)   Discharge Planning Services: CM Consult Post Acute Care Choice: NA Living arrangements for the past 2 months: Single Family Home Expected Discharge Date: 02/24/19               DME Arranged: 3-N-1, Walker rolling, Oxygen DME Agency: [facility] Date DME Agency Contacted: 02/24/19 Time DME Agency Contacted: [phone] Representative spoke with at DME Agency: [facility] HH Arranged: PT HH Agency: [facility] Date HH Agency Contacted: 02/24/19(charity case pending approval) Time HH Agency Contacted: 1534 Representative spoke with at [facility] Agency: [facility]   Social Determinants of Health (SDOH) Interventions    Readmission Risk Interventions Readmission  Risk Prevention Plan 02/24/2019  Transportation Screening Complete  PCP or Specialist Appt within 3-5 Days Complete  HRI or Home Care Consult Complete  Social Work Consult for Recovery Care Planning/Counseling Complete  Palliative Care Screening Not Applicable  Medication Review Oceanographer) Complete  Some recent data might be hidden

## 2019-02-24 NOTE — Progress Notes (Signed)
Patient was discharged home with home health by MD order; discharged instructions review and give to patient with care notes; IV DIC; skin intact; DME delivered to the patient's room; patient will be escorted to the car by nurse tech via wheelchair.

## 2019-02-24 NOTE — Discharge Summary (Addendum)
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 right lower lobe opacity, which could reflect atelectasis, infiltrate, or aspiration. Electronically Signed   By: Rise Mu M.D.   On: 02/19/2019 20:24   Dg Chest Port 1 View  Result Date: 02/14/2019 CLINICAL DATA:  53 year old male with history of shortness of breath, neck pain and night sweats for several days. EXAM: PORTABLE CHEST 1 VIEW COMPARISON:  Chest x-ray 08/10/2016. FINDINGS: Ill-defined opacities in the lung bases bilaterally, concerning for developing multilobar bronchopneumonia. Lung volumes are low. No pleural effusions. No evidence of pulmonary edema. Heart size is borderline enlarged. Upper mediastinal contours are within normal limits. IMPRESSION: 1. The appearance of the lung bases is concerning for multilobar bronchopneumonia. Electronically Signed   By: Trudie Reed M.D.   On: 02/14/2019 14:11   Dg Foot Complete Right  Result Date: 02/06/2019 CLINICAL DATA:  Pain for  approximately 1 month EXAM: RIGHT FOOT COMPLETE - 3+ VIEW COMPARISON:  October 07, 2018 FINDINGS: Frontal, oblique, and lateral views were obtained. No evident acute fracture or dislocation. There is slight increase in narrowing of the first MTP joint. There is also osteoarthritic change in the medial cuneiform-first metatarsal joint. There is spurring throughout the dorsal midfoot, also present previously. No erosions. There is pes planus. 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 Slight increase in narrowing of the first MTP joint compared to most recent study. No fracture or dislocation. There is pes planus. Electronically Signed   By: Bretta Bang III M.D.   On: 02/06/2019 13:40    Micro Results   Recent Results (from the past 240 hour(s))  SARS Coronavirus 2 [facility] order, Performed in [facility] hospital lab)     Status: None   Collection Time: 02/19/19  8:10 PM  Result Value Ref Range Status   SARS Coronavirus 2 NEGATIVE NEGATIVE Final    Comment: (NOTE) If result is NEGATIVE SARS-CoV-2 target nucleic acids are NOT DETECTED. The SARS-CoV-2 RNA is generally detectable in upper and lower  respiratory specimens during the acute phase of infection. The lowest  concentration of SARS-CoV-2 viral copies this assay can detect is 250  copies / mL. A negative result does not preclude SARS-CoV-2 infection  and should not be used as the sole basis for treatment or other  patient management decisions.  A negative result may occur with  improper specimen collection / handling, submission of specimen other  than nasopharyngeal swab, presence of viral mutation(s) within the  areas targeted by this assay, and inadequate number of viral copies  (<250 copies / mL). A negative result must be combined with clinical  observations, patient history, and epidemiological information. If result is POSITIVE SARS-CoV-2 target nucleic acids are DETECTED. The SARS-CoV-2 RNA is  generally detectable in upper and lower  respiratory specimens dur ing the acute phase of infection.  Positive  results are indicative of active infection with SARS-CoV-2.  Clinical  correlation with patient history and other diagnostic information is  necessary to determine patient infection status.  Positive results do  not rule out bacterial infection or co-infection with other viruses. If result is PRESUMPTIVE POSTIVE SARS-CoV-2 nucleic acids MAY BE PRESENT.   A presumptive positive result was obtained on the submitted specimen  and confirmed on repeat testing.  While 2019 novel coronavirus  (SARS-CoV-2) nucleic acids may be present in the submitted sample  additional confirmatory testing may be necessary for epidemiological  and / or clinical management purposes  to differentiate between  SARS-CoV-2 and other Sarbecovirus currently known to infect humans.  If clinically indicated additional testing with an alternate test  methodology [phone]) is advised. The SARS-CoV-2 RNA is generally  detectable in upper and lower respiratory sp ecimens during the acute  phase of infection. The expected result is Negative. Fact Sheet for Patients:  BoilerBrush.com.cy Fact Sheet for Healthcare Providers: https://pope.com/ This test is not yet approved or cleared by the [place] FDA and has been authorized for detection and/or diagnosis of SARS-CoV-2 by FDA under an Emergency Use Authorization (EUA).  This EUA will remain in effect (meaning this test can be used) for the duration of the COVID-19 declaration under Section 564(b)(1) of the Act, 21 U.S.C. section 360bbb-3(b)(1), unless the authorization is terminated or revoked sooner. Performed at [facility], [address] [place], [place] [address]   Blood Culture (routine x 2)     Status: None   Collection Time: 02/19/19  8:20 PM  Result Value Ref Range Status   Specimen  Description BLOOD RIGHT UPPER ARM  Final   Special Requests   Final    BOTTLES DRAWN AEROBIC AND ANAEROBIC Blood Culture results may not be optimal due to an inadequate volume of blood received in culture bottles   Culture   Final    NO GROWTH 5 DAYS Performed at [facility], [address] [place], [place] [address]    Report Status 02/24/2019 FINAL  Final  Blood Culture (routine x 2)     Status: None   Collection Time: 02/19/19  8:31 PM  Result Value Ref Range Status   Specimen Description BLOOD RIGHT ANTECUBITAL  Final   Special Requests   Final    BOTTLES DRAWN AEROBIC AND ANAEROBIC Blood Culture results may not be optimal due to an inadequate volume of blood received in culture bottles   Culture   Final    NO GROWTH 5 DAYS Performed at [facility], [address], [place], [place] [address]    Report Status 02/24/2019 FINAL  Final  C difficile quick scan w PCR reflex     Status: None   Collection Time: 02/20/19  1:00 AM  Result Value Ref Range Status   C Diff antigen NEGATIVE NEGATIVE Final   C Diff toxin NEGATIVE NEGATIVE Final   C Diff interpretation No C. difficile detected.  Final    Comment: Performed at [facility], [address] [place], [place] [address]  MRSA PCR Screening     Status: None   Collection Time: 02/20/19  5:30 AM  Result Value Ref Range Status   MRSA by PCR NEGATIVE NEGATIVE Final    Comment:        The GeneXpert MRSA Assay (FDA approved for NASAL specimens only), is one component of a comprehensive MRSA colonization surveillance program. It is not intended to diagnose MRSA infection nor to guide or monitor treatment for MRSA infections. Performed at [facility], [address] [place], [place] [address]        Today   Subjective    David Benson today has no new complaints Cognitively back to baseline, ambulating around in the room without dizziness or dyspnea on exertion         At rest heart rate in  the 40s, heart rate goes up to the 70s with ambulation....   Patient has been seen and examined prior to discharge   Objective   Blood pressure 133/85, pulse (!) 45, temperature 98.5 F (36.9 C), temperature source Oral, resp. rate 18, height 5\' 9"  (1.753 m), weight 81.7 kg, SpO2 100 %.   Intake/Output Summary (Last 24 hours) at 02/24/2019 1526 Last data filed at 02/24/2019 0954 Gross per 24 hour  Intake 1209.07 ml  Output 250 ml  Net 959.07 ml    Exam Gen:- Awake Alert,  In no apparent distress  HEENT:- [place].AT, No sclera icterus Neck-Supple Neck,No JVD,.  Lungs-much improved air movement, no wheezing or rhonchi  CV- S1, S2 normal, HR around 44.Marland KitchenMarland KitchenMarland KitchenMarland Kitchen Abd-  +ve B.Sounds, Abd Soft, No tenderness,    Extremity/Skin:- No  edema, pedal pulses present  Psych-affect is appropriate, oriented x3 Neuro-generalized weakness, but no new focal deficits, no tremors   Data Review   CBC w Diff:  Lab Results  Component Value Date   WBC 4.3 02/23/2019   HGB 11.1 (L) 02/23/2019   HCT 33.9 (L) 02/23/2019   PLT 153 02/23/2019  LYMPHOPCT 6 02/19/2019   MONOPCT 6 02/19/2019   EOSPCT 0 02/19/2019   BASOPCT 0 02/19/2019    CMP:  Lab Results  Component Value Date   NA 140 02/23/2019   K 3.7 02/23/2019   CL 109 02/23/2019   CO2 24 02/23/2019   BUN 6 02/23/2019   CREATININE 1.11 02/23/2019   PROT 4.7 (L) 02/23/2019   ALBUMIN 2.6 (L) 02/23/2019   BILITOT 0.9 02/23/2019   ALKPHOS 42 02/23/2019   AST 45 (H) 02/23/2019   ALT 69 (H) 02/23/2019    Total Discharge time is about 33 minutes  Shon Hale M.D on 02/24/2019 at 3:26 PM  Go to [email or web address] -  for contact info  [facility] - Office  [phone]

## 2019-02-24 NOTE — Progress Notes (Signed)
Patient's wife David Benson was informed about patient's needs at home, and the fact that Case Manager is working on IT trainer. Will continue to monitor.
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
## 2019-02-24 NOTE — Progress Notes (Signed)
Progress Note  Patient Name: David Benson Date of Encounter: 02/24/2019  Primary Cardiologist: Last seen by Tereso Newcomer, PA-C, in 2011.  Subjective   No significant overnight events.  Pt is more awake today  HR remains slow.  Sinus brady   Inpatient Medications    Scheduled Meds: . folic acid  1 mg Oral Daily  . levETIRAcetam  1,000 mg Oral BID  . mouth rinse  15 mL Mouth Rinse BID  . metroNIDAZOLE  500 mg Oral Q8H  . multivitamin with minerals  1 tablet Oral Daily  . thiamine  100 mg Oral Daily  . traMADol  50 mg Oral Once   Continuous Infusions: . sodium chloride 75 mL/hr at 02/23/19 2145  . ceFEPime (MAXIPIME) IV 2 g (02/23/19 2153)   PRN Meds: alum & mag hydroxide-simeth, ondansetron **OR** ondansetron (ZOFRAN) IV   Vital Signs    Vitals:   02/23/19 1413 02/23/19 1800 02/23/19 2109 02/24/19 0534  BP: 132/86  (!) 119/96 119/72  Pulse: (!) 40 (!) 46 (!) 44 (!) 43  Resp: 17  18 18   Temp: 98.9 F (37.2 C)  99.3 F (37.4 C) 97.9 F (36.6 C)  TempSrc: Oral  Oral   SpO2: 100%  99% 100%  Weight:      Height:        Intake/Output Summary (Last 24 hours) at 02/24/2019 0848 Last data filed at 02/24/2019 0805 Gross per 24 hour  Intake 1194.02 ml  Output 250 ml  Net 944.02 ml   Last 3 Weights 02/21/2019 02/14/2019 05/03/2018  Weight (lbs) 180 lb 1.5 oz 180 lb 171 lb 1.2 oz  Weight (kg) 81.689 kg 81.647 kg 77.6 kg      Telemetry    Sinus brady at 43  - Personally Reviewed  ECG    No new ECG tracing today. - Personally Reviewed  Physical Exam   Physical Exam per MD:   GEN: No acute distress. Much more awake today  Neck: No JVD. Cardiac: RRR.  Soft systolic murmur  Respiratory:  clear to auscultation  GI: Soft, non-tender, non-distended. MS:  no edema Neuro:  No focal deficits.  More awake today .   Psych: Normal affect.  Labs    Chemistry Recent Labs  Lab 02/20/19 0454 02/21/19 0258 02/22/19 0221 02/23/19 0347  NA 140 143 141 140  K 5.0  4.2 3.9 3.7  CL 109 115* 109 109  CO2 20* 21* 23 24  GLUCOSE 90 78 92 99  BUN 24* 18 9 6   CREATININE 2.28* 1.54* 1.22 1.11  CALCIUM 7.6* 7.9* 8.4* 8.2*  PROT 6.0* 5.0*  --  4.7*  ALBUMIN 3.3* 2.8*  --  2.6*  AST 345* 181*  --  45*  ALT 279* 163*  --  69*  ALKPHOS 62 50  --  42  BILITOT 0.6 0.8  --  0.9  GFRNONAA 32* 51* >60 >60  GFRAA 37* 60* >60 >60  ANIONGAP 11 7 9 7      Hematology Recent Labs  Lab 02/21/19 0258 02/22/19 0221 02/23/19 0347  WBC 5.9 5.3 4.3  RBC 3.58* 3.55* 3.30*  HGB 11.9* 12.2* 11.1*  HCT 37.6* 37.5* 33.9*  MCV 105.0* 105.6* 102.7*  MCH 33.2 34.4* 33.6  MCHC 31.6 32.5 32.7  RDW 14.5 14.0 13.3  PLT 177 121* 153    Cardiac EnzymesNo results for input(s): TROPONINI in the last 168 hours. No results for input(s): TROPIPOC in the last 168 hours.   BNPNo  results for input(s): BNP, PROBNP in the last 168 hours.   DDimer No results for input(s): DDIMER in the last 168 hours.   Radiology    No results found.  Cardiac Studies   Echocardiogram   1. The left ventricle has normal systolic function, with an ejection fraction of 60-65%. The cavity size was normal. Left ventricular diastolic parameters were normal.  2. The right ventricle has normal systolc function. The cavity was normal. There is no increase in right ventricular wall thickness. Right ventricular systolic pressure could not be assessed.  3. The aortic root and ascending aorta are normal in size and structure.  Summary: Marked sinus bradycardia and periods of junctional rhythm are seen during the study.  Patient Profile     53 y.o. male with a history of polysubstance abuse (cocaine, alcohol, and tobacco), seizure disorder, asthma, GERD, aspiration pneumonia, and alcoholic hepatitis who was admitted on 02/20/2019 after being found on the sidewalk outside an apartment complex. Family reported a possibly drug overdose. Cardiology was consulted for evaluation of bradycardia at the request of  Dr. Mariea ClontsEmokpae.  Assessment & Plan    Sinus Bradycardia - Patient was admitted with sepsis syndrome, rhabdomyolysis, cocaine abuse, and alcohol abuse. He had marked mental status changes on exam. The exact etiology of his sinus bradycardia is not clear but may be related to all his underlying medical issues. Patient is not on any AV nodal agents at home. - Telemetry shows sinus brady  - Echo this admission shows LVEF of 60-65%. - Patient felt to be a poor candidate for pacer insertion. Fortunately patient has had no symptoms. Most recent BP 119/72. Creatinine continues to improve. - Bradycardia will likely gradually improve as his current acute problems improve.   Sepsis Syndrome - Continue antibiotics for pneumonia/sepsis syndrome. - Management per primary team.  Polysubstance Abuse - Dr. Elease HashimotoNahser had discussion with patient yesterday and advised complete cessation of his cocaine use and moderation of his alcohol use.    For questions or updates, please contact [facility] Please consult [email or web address] for contact info under        Signed, Corrin ParkerCallie E Goodrich, PA-C  02/24/2019, 8:48 AM     Attending Note:   The patient was seen and examined.  Agree with assessment and plan as noted above.  Changes made to the above note as needed.  Patient seen and independently examined with  Marjie Skiffallie Goodrich, PA .   We discussed all aspects of the encounter. I agree with the assessment and plan as stated above.  1.  Sinus bradycardia:  No syncope.   Pt seem more awake today . Will see how he does once he's up and around .  No indication for pacer given lack of symptoms    I have spent a total of 40 minutes with patient reviewing hospital  notes , telemetry, EKGs, labs and examining patient as well as establishing an assessment and plan that was discussed with the patient. > 50% of time was spent in direct patient care.  [facility] will sign off.   Medication Recommendations:  No new  medications from a cardiac standpoint  Other recommendations (labs, testing, etc):    Follow up as an outpatient:   With primary MD    Vesta MixerPhilip J. Harsha Yusko, Montez HagemanJr., MD, [facility] HospitalFACC 02/24/2019, 9:56 AM [address],  [address] Office [phone] Pager [phone]

## 2019-03-04 ENCOUNTER — Inpatient Hospital Stay: Payer: Self-pay | Admitting: Critical Care Medicine

## 2019-09-03 ENCOUNTER — Ambulatory Visit (HOSPITAL_COMMUNITY)
Admission: EM | Admit: 2019-09-03 | Discharge: 2019-09-03 | Disposition: A | Discharge status: Home / Self Care | Payer: Self-pay | Attending: Family Medicine | Admitting: Family Medicine

## 2019-09-03 ENCOUNTER — Other Ambulatory Visit: Payer: Self-pay

## 2019-09-03 ENCOUNTER — Encounter (HOSPITAL_COMMUNITY): Payer: Self-pay | Admitting: Emergency Medicine

## 2019-09-03 DIAGNOSIS — R369 Urethral discharge, unspecified: Secondary | ICD-10-CM

## 2019-09-03 MED ORDER — CEFTRIAXONE SODIUM 250 MG IJ SOLR
INTRAMUSCULAR | Status: AC
  Filled 2019-09-03: qty 250

## 2019-09-03 MED ORDER — CEFTRIAXONE SODIUM 250 MG IJ SOLR
250.0000 mg | Freq: Once | INTRAMUSCULAR | Status: AC
  Administered 2019-09-03: 250 mg via INTRAMUSCULAR

## 2019-09-03 MED ORDER — AZITHROMYCIN 250 MG PO TABS
1000.0000 mg | ORAL_TABLET | Freq: Once | ORAL | Status: AC
  Administered 2019-09-03: 1000 mg via ORAL

## 2019-09-03 MED ORDER — AZITHROMYCIN 250 MG PO TABS
ORAL_TABLET | ORAL | Status: AC
  Filled 2019-09-03: qty 4

## 2019-09-03 NOTE — ED Triage Notes (Signed)
Penile discharge started yesterday

## 2019-09-03 NOTE — Discharge Instructions (Signed)
You have been given the following medications today for treatment of suspected gonorrhea and/or chlamydia: ° °cefTRIAXone (ROCEPHIN) injection 250 mg °azithromycin (ZITHROMAX) tablet 1,000 mg ° °Even though we have treated you today, we have sent testing for sexually transmitted infections. We will notify you of any positive results once they are received. If required, we will prescribe any medications you might need. ° °Please refrain from all sexual activity for at least the next seven days. °
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
## 2019-09-07 ENCOUNTER — Telehealth (HOSPITAL_COMMUNITY): Payer: Self-pay | Admitting: Emergency Medicine

## 2019-09-07 LAB — CYTOLOGY, (ORAL, ANAL, URETHRAL) ANCILLARY ONLY
Chlamydia: NEGATIVE
Neisseria Gonorrhea: POSITIVE — AB
Trichomonas: NEGATIVE

## 2019-09-07 NOTE — Telephone Encounter (Signed)
Test for gonorrhea was positive. This was treated at the urgent care visit with IM rocephin 250mg  and po zithromax 1g. Pt needs education to refrain from sexual intercourse for 7 days after treatment to give the medicine time to work. Sexual partners need to be notified and tested/treated. Condoms may reduce risk of reinfection. Recheck or followup with PCP for further evaluation if symptoms are not improving. [facility] notified.   Attempted to reach patient. No answer at this time. Wife answered, states he does not have a phone. Wife attempted to ask what reason for phone call was, was upset that I would not discuss anything with her. Wife stated "well then I just dont know when you'll get to talk to him", and hung up on me. Letter sent.

## 2019-12-09 ENCOUNTER — Ambulatory Visit (HOSPITAL_COMMUNITY)
Admission: EM | Admit: 2019-12-09 | Discharge: 2019-12-09 | Payer: HRSA Program | Attending: Family Medicine | Admitting: Family Medicine

## 2019-12-09 ENCOUNTER — Emergency Department (HOSPITAL_COMMUNITY): Payer: Self-pay

## 2019-12-09 ENCOUNTER — Ambulatory Visit (INDEPENDENT_AMBULATORY_CARE_PROVIDER_SITE_OTHER): Payer: HRSA Program

## 2019-12-09 ENCOUNTER — Encounter (HOSPITAL_COMMUNITY): Payer: Self-pay

## 2019-12-09 ENCOUNTER — Inpatient Hospital Stay (HOSPITAL_COMMUNITY): Payer: Self-pay

## 2019-12-09 ENCOUNTER — Other Ambulatory Visit: Payer: Self-pay

## 2019-12-09 ENCOUNTER — Inpatient Hospital Stay (HOSPITAL_COMMUNITY)
Admission: EM | Admit: 2019-12-09 | Discharge: 2019-12-13 | DRG: 390 | Disposition: A | Discharge status: Home / Self Care | Payer: Self-pay | Attending: Internal Medicine | Admitting: Internal Medicine

## 2019-12-09 DIAGNOSIS — Z91018 Allergy to other foods: Secondary | ICD-10-CM

## 2019-12-09 DIAGNOSIS — Z880 Allergy status to penicillin: Secondary | ICD-10-CM

## 2019-12-09 DIAGNOSIS — R109 Unspecified abdominal pain: Secondary | ICD-10-CM | POA: Diagnosis not present

## 2019-12-09 DIAGNOSIS — Z8719 Personal history of other diseases of the digestive system: Secondary | ICD-10-CM | POA: Insufficient documentation

## 2019-12-09 DIAGNOSIS — K56609 Unspecified intestinal obstruction, unspecified as to partial versus complete obstruction: Principal | ICD-10-CM | POA: Diagnosis present

## 2019-12-09 DIAGNOSIS — D539 Nutritional anemia, unspecified: Secondary | ICD-10-CM | POA: Diagnosis present

## 2019-12-09 DIAGNOSIS — J449 Chronic obstructive pulmonary disease, unspecified: Secondary | ICD-10-CM | POA: Diagnosis not present

## 2019-12-09 DIAGNOSIS — Z833 Family history of diabetes mellitus: Secondary | ICD-10-CM

## 2019-12-09 DIAGNOSIS — M549 Dorsalgia, unspecified: Secondary | ICD-10-CM | POA: Diagnosis not present

## 2019-12-09 DIAGNOSIS — Z8249 Family history of ischemic heart disease and other diseases of the circulatory system: Secondary | ICD-10-CM

## 2019-12-09 DIAGNOSIS — Z20822 Contact with and (suspected) exposure to covid-19: Secondary | ICD-10-CM | POA: Insufficient documentation

## 2019-12-09 DIAGNOSIS — K567 Ileus, unspecified: Secondary | ICD-10-CM

## 2019-12-09 DIAGNOSIS — Z0189 Encounter for other specified special examinations: Secondary | ICD-10-CM

## 2019-12-09 DIAGNOSIS — Z79899 Other long term (current) drug therapy: Secondary | ICD-10-CM | POA: Insufficient documentation

## 2019-12-09 DIAGNOSIS — K219 Gastro-esophageal reflux disease without esophagitis: Secondary | ICD-10-CM | POA: Diagnosis present

## 2019-12-09 DIAGNOSIS — F1010 Alcohol abuse, uncomplicated: Secondary | ICD-10-CM | POA: Diagnosis present

## 2019-12-09 DIAGNOSIS — J45909 Unspecified asthma, uncomplicated: Secondary | ICD-10-CM | POA: Diagnosis not present

## 2019-12-09 DIAGNOSIS — R1110 Vomiting, unspecified: Secondary | ICD-10-CM | POA: Insufficient documentation

## 2019-12-09 DIAGNOSIS — F17200 Nicotine dependence, unspecified, uncomplicated: Secondary | ICD-10-CM | POA: Diagnosis present

## 2019-12-09 DIAGNOSIS — M1990 Unspecified osteoarthritis, unspecified site: Secondary | ICD-10-CM | POA: Diagnosis present

## 2019-12-09 DIAGNOSIS — Z4659 Encounter for fitting and adjustment of other gastrointestinal appliance and device: Secondary | ICD-10-CM

## 2019-12-09 DIAGNOSIS — G40909 Epilepsy, unspecified, not intractable, without status epilepticus: Secondary | ICD-10-CM | POA: Diagnosis present

## 2019-12-09 DIAGNOSIS — Z905 Acquired absence of kidney: Secondary | ICD-10-CM

## 2019-12-09 DIAGNOSIS — Z886 Allergy status to analgesic agent status: Secondary | ICD-10-CM

## 2019-12-09 DIAGNOSIS — F1910 Other psychoactive substance abuse, uncomplicated: Secondary | ICD-10-CM | POA: Diagnosis present

## 2019-12-09 DIAGNOSIS — Z809 Family history of malignant neoplasm, unspecified: Secondary | ICD-10-CM

## 2019-12-09 LAB — CBC
HCT: 45.8 % (ref 39.0–52.0)
Hemoglobin: 14.5 g/dL (ref 13.0–17.0)
MCH: 34.3 pg — ABNORMAL HIGH (ref 26.0–34.0)
MCHC: 31.7 g/dL (ref 30.0–36.0)
MCV: 108.3 fL — ABNORMAL HIGH (ref 80.0–100.0)
Platelets: 212 10*3/uL (ref 150–400)
RBC: 4.23 MIL/uL (ref 4.22–5.81)
RDW: 13.7 % (ref 11.5–15.5)
WBC: 5.1 10*3/uL (ref 4.0–10.5)
nRBC: 0 % (ref 0.0–0.2)

## 2019-12-09 LAB — COMPREHENSIVE METABOLIC PANEL
ALT: 19 U/L (ref 0–44)
AST: 24 U/L (ref 15–41)
Albumin: 3.5 g/dL (ref 3.5–5.0)
Alkaline Phosphatase: 62 U/L (ref 38–126)
Anion gap: 8 (ref 5–15)
BUN: 8 mg/dL (ref 6–20)
CO2: 27 mmol/L (ref 22–32)
Calcium: 9 mg/dL (ref 8.9–10.3)
Chloride: 105 mmol/L (ref 98–111)
Creatinine, Ser: 1.18 mg/dL (ref 0.61–1.24)
GFR calc Af Amer: >60 mL/min (ref >60)
GFR calc non Af Amer: >60 mL/min (ref >60)
Glucose, Bld: 98 mg/dL (ref 70–99)
Potassium: 4.1 mmol/L (ref 3.5–5.1)
Sodium: 140 mmol/L (ref 135–145)
Total Bilirubin: 0.6 mg/dL (ref 0.3–1.2)
Total Protein: 6.4 g/dL — ABNORMAL LOW (ref 6.5–8.1)

## 2019-12-09 LAB — LIPASE, BLOOD: Lipase: 33 U/L (ref 11–51)

## 2019-12-09 LAB — RESPIRATORY PANEL BY RT PCR (FLU A&B, COVID)
Influenza A by PCR: NEGATIVE
Influenza B by PCR: NEGATIVE
SARS Coronavirus 2 by RT PCR: NEGATIVE

## 2019-12-09 MED ORDER — THIAMINE HCL 100 MG/ML IJ SOLN
100.0000 mg | Freq: Every day | INTRAMUSCULAR | Status: DC
  Administered 2019-12-10 – 2019-12-11 (×2): 100 mg via INTRAVENOUS
  Filled 2019-12-09 (×2): qty 2

## 2019-12-09 MED ORDER — LEVETIRACETAM IN NACL 1000 MG/100ML IV SOLN
1000.0000 mg | Freq: Once | INTRAVENOUS | Status: AC
  Administered 2019-12-09: 1000 mg via INTRAVENOUS
  Filled 2019-12-09: qty 100

## 2019-12-09 MED ORDER — MORPHINE SULFATE (PF) 4 MG/ML IV SOLN
4.0000 mg | Freq: Once | INTRAVENOUS | Status: AC
  Administered 2019-12-09: 4 mg via INTRAVENOUS
  Filled 2019-12-09: qty 1

## 2019-12-09 MED ORDER — ACETAMINOPHEN 325 MG PO TABS
650.0000 mg | ORAL_TABLET | Freq: Four times a day (QID) | ORAL | Status: DC | PRN
  Administered 2019-12-11 – 2019-12-12 (×4): 650 mg via ORAL
  Filled 2019-12-09 (×5): qty 2

## 2019-12-09 MED ORDER — LORAZEPAM 2 MG/ML IJ SOLN: 0.0000 mg | Freq: Two times a day (BID) | INTRAMUSCULAR | Status: DC

## 2019-12-09 MED ORDER — ALBUTEROL SULFATE (2.5 MG/3ML) 0.083% IN NEBU: 3.0000 mL | INHALATION_SOLUTION | Freq: Four times a day (QID) | RESPIRATORY_TRACT | Status: DC | PRN

## 2019-12-09 MED ORDER — ONDANSETRON HCL 4 MG/2ML IJ SOLN
4.0000 mg | Freq: Once | INTRAMUSCULAR | Status: AC
  Administered 2019-12-09: 16:00:00 4 mg via INTRAVENOUS
  Filled 2019-12-09: qty 2

## 2019-12-09 MED ORDER — SODIUM CHLORIDE 0.9 % IV SOLN: INTRAVENOUS | Status: DC

## 2019-12-09 MED ORDER — ACETAMINOPHEN 650 MG RE SUPP: 650.0000 mg | Freq: Four times a day (QID) | RECTAL | Status: DC | PRN

## 2019-12-09 MED ORDER — SODIUM CHLORIDE 0.9% FLUSH: 3.0000 mL | Freq: Once | INTRAVENOUS | Status: DC

## 2019-12-09 MED ORDER — ONDANSETRON HCL 4 MG/2ML IJ SOLN: 4.0000 mg | Freq: Four times a day (QID) | INTRAMUSCULAR | Status: DC | PRN

## 2019-12-09 MED ORDER — MORPHINE SULFATE (PF) 2 MG/ML IV SOLN
2.0000 mg | INTRAVENOUS | Status: DC | PRN
  Administered 2019-12-09 – 2019-12-10 (×4): 4 mg via INTRAVENOUS
  Filled 2019-12-09 (×4): qty 2

## 2019-12-09 MED ORDER — HYDROMORPHONE HCL 1 MG/ML IJ SOLN
1.0000 mg | Freq: Once | INTRAMUSCULAR | Status: AC
  Administered 2019-12-09: 1 mg via INTRAVENOUS
  Filled 2019-12-09: qty 1

## 2019-12-09 MED ORDER — LORAZEPAM 2 MG/ML IJ SOLN
0.0000 mg | Freq: Four times a day (QID) | INTRAMUSCULAR | Status: AC
  Filled 2019-12-09 (×2): qty 1

## 2019-12-09 MED ORDER — LORAZEPAM 2 MG/ML IJ SOLN: 1.0000 mg | INTRAMUSCULAR | Status: AC | PRN

## 2019-12-09 MED ORDER — LEVETIRACETAM IN NACL 1000 MG/100ML IV SOLN
1000.0000 mg | Freq: Two times a day (BID) | INTRAVENOUS | Status: DC
  Administered 2019-12-10 – 2019-12-13 (×7): 1000 mg via INTRAVENOUS
  Filled 2019-12-09 (×9): qty 100

## 2019-12-09 MED ORDER — THIAMINE HCL 100 MG PO TABS
100.0000 mg | ORAL_TABLET | Freq: Every day | ORAL | Status: DC
  Administered 2019-12-12 – 2019-12-13 (×2): 100 mg via ORAL
  Filled 2019-12-09 (×3): qty 1

## 2019-12-09 MED ORDER — LORAZEPAM 1 MG PO TABS: 1.0000 mg | ORAL_TABLET | ORAL | Status: AC | PRN

## 2019-12-09 MED ORDER — SODIUM CHLORIDE 0.9 % IV SOLN: INTRAVENOUS | Status: AC

## 2019-12-09 MED ORDER — IOHEXOL 300 MG/ML  SOLN
100.0000 mL | Freq: Once | INTRAMUSCULAR | Status: AC | PRN
  Administered 2019-12-09: 100 mL via INTRAVENOUS
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
## 2019-12-09 NOTE — ED Triage Notes (Signed)
Pt presents to ED w/generalized abd pain, seen at [facility] dx w/a "blockage" in his abd, sent here for further evaluation. Hx of SBO's
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
## 2019-12-09 NOTE — Progress Notes (Signed)
Patient admitted from ED to [phone]. Alert and oriented x4. Vital signs within normal limit. Skin intact. Denies c/o pain at this time.. NGT to right nare in place. Oriented to room and remote. Paged admitting MD for admission orders.

## 2019-12-09 NOTE — ED Notes (Signed)
Patient is being discharged from the Urgent Care Center and sent to the Emergency Department via personal vehicle by self. Per provider Dahlia Byes, patient is stable but in need of higher level of care due to small bowel obstruction. Patient is aware and verbalizes understanding of plan of care.    Vitals:   12/09/19 1213  BP: 118/80  Pulse: 66  Resp: 20  Temp: 98.4 F (36.9 C)  SpO2: 100%

## 2019-12-09 NOTE — ED Triage Notes (Signed)
Pt reports he started having lower back pain since yesterday, after he was unloading a truck. Pt reports having abdominal pain and vomiting since yesterday.

## 2019-12-09 NOTE — Discharge Instructions (Addendum)
Go to the ER.
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
## 2019-12-09 NOTE — ED Notes (Signed)
Please call Pt's daughter Angle Tarnowski [phone]. Angle also works on [address]  And may be called there tonight.

## 2019-12-10 ENCOUNTER — Inpatient Hospital Stay (HOSPITAL_COMMUNITY): Payer: Self-pay

## 2019-12-10 DIAGNOSIS — F1010 Alcohol abuse, uncomplicated: Secondary | ICD-10-CM

## 2019-12-10 DIAGNOSIS — G40909 Epilepsy, unspecified, not intractable, without status epilepticus: Secondary | ICD-10-CM

## 2019-12-10 DIAGNOSIS — K56609 Unspecified intestinal obstruction, unspecified as to partial versus complete obstruction: Principal | ICD-10-CM

## 2019-12-10 DIAGNOSIS — J449 Chronic obstructive pulmonary disease, unspecified: Secondary | ICD-10-CM

## 2019-12-10 LAB — CBC
HCT: 40.2 % (ref 39.0–52.0)
Hemoglobin: 12.9 g/dL — ABNORMAL LOW (ref 13.0–17.0)
MCH: 34.4 pg — ABNORMAL HIGH (ref 26.0–34.0)
MCHC: 32.1 g/dL (ref 30.0–36.0)
MCV: 107.2 fL — ABNORMAL HIGH (ref 80.0–100.0)
Platelets: 188 10*3/uL (ref 150–400)
RBC: 3.75 MIL/uL — ABNORMAL LOW (ref 4.22–5.81)
RDW: 13.8 % (ref 11.5–15.5)
WBC: 3.5 10*3/uL — ABNORMAL LOW (ref 4.0–10.5)
nRBC: 0 % (ref 0.0–0.2)

## 2019-12-10 LAB — COMPREHENSIVE METABOLIC PANEL
ALT: 18 U/L (ref 0–44)
AST: 28 U/L (ref 15–41)
Albumin: 3.1 g/dL — ABNORMAL LOW (ref 3.5–5.0)
Alkaline Phosphatase: 47 U/L (ref 38–126)
Anion gap: 8 (ref 5–15)
BUN: 6 mg/dL (ref 6–20)
CO2: 28 mmol/L (ref 22–32)
Calcium: 8.5 mg/dL — ABNORMAL LOW (ref 8.9–10.3)
Chloride: 106 mmol/L (ref 98–111)
Creatinine, Ser: 1.1 mg/dL (ref 0.61–1.24)
GFR calc Af Amer: >60 mL/min (ref >60)
GFR calc non Af Amer: >60 mL/min (ref >60)
Glucose, Bld: 93 mg/dL (ref 70–99)
Potassium: 4.1 mmol/L (ref 3.5–5.1)
Sodium: 142 mmol/L (ref 135–145)
Total Bilirubin: 0.6 mg/dL (ref 0.3–1.2)
Total Protein: 5.3 g/dL — ABNORMAL LOW (ref 6.5–8.1)

## 2019-12-10 LAB — PHOSPHORUS: Phosphorus: 3.8 mg/dL (ref 2.5–4.6)

## 2019-12-10 LAB — HIV ANTIBODY (ROUTINE TESTING W REFLEX): HIV Screen 4th Generation wRfx: NONREACTIVE

## 2019-12-10 LAB — MAGNESIUM: Magnesium: 1.7 mg/dL (ref 1.7–2.4)

## 2019-12-10 MED ORDER — ORAL CARE MOUTH RINSE
15.0000 mL | Freq: Two times a day (BID) | OROMUCOSAL | Status: DC
  Administered 2019-12-10 – 2019-12-12 (×3): 15 mL via OROMUCOSAL

## 2019-12-10 MED ORDER — MORPHINE SULFATE (PF) 2 MG/ML IV SOLN
2.0000 mg | INTRAVENOUS | Status: DC | PRN
  Administered 2019-12-10 – 2019-12-11 (×5): 2 mg via INTRAVENOUS
  Filled 2019-12-10 (×5): qty 1

## 2019-12-10 MED ORDER — DIATRIZOATE MEGLUMINE & SODIUM 66-10 % PO SOLN
90.0000 mL | Freq: Once | ORAL | Status: AC
  Administered 2019-12-10: 90 mL via NASOGASTRIC
  Filled 2019-12-10: qty 90
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
## 2019-12-10 NOTE — Plan of Care (Signed)
  Problem: Education: Goal: Knowledge of General Education information will improve Description Including pain rating scale, medication(s)/side effects and non-pharmacologic comfort measures Outcome: Progressing   
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
## 2019-12-11 LAB — IRON AND TIBC
Iron: 91 ug/dL (ref 45–182)
Saturation Ratios: 31 % (ref 17.9–39.5)
TIBC: 290 ug/dL (ref 250–450)
UIBC: 199 ug/dL

## 2019-12-11 LAB — BASIC METABOLIC PANEL
Anion gap: 11 (ref 5–15)
BUN: 6 mg/dL (ref 6–20)
CO2: 28 mmol/L (ref 22–32)
Calcium: 8.7 mg/dL — ABNORMAL LOW (ref 8.9–10.3)
Chloride: 106 mmol/L (ref 98–111)
Creatinine, Ser: 1.18 mg/dL (ref 0.61–1.24)
GFR calc Af Amer: >60 mL/min (ref >60)
GFR calc non Af Amer: >60 mL/min (ref >60)
Glucose, Bld: 67 mg/dL — ABNORMAL LOW (ref 70–99)
Potassium: 3.8 mmol/L (ref 3.5–5.1)
Sodium: 145 mmol/L (ref 135–145)

## 2019-12-11 LAB — FERRITIN: Ferritin: 63 ng/mL (ref 24–336)

## 2019-12-11 LAB — CBC WITH DIFFERENTIAL/PLATELET
Abs Immature Granulocytes: 0.01 10*3/uL (ref 0.00–0.07)
Basophils Absolute: 0 10*3/uL (ref 0.0–0.1)
Basophils Relative: 0 %
Eosinophils Absolute: 0.2 10*3/uL (ref 0.0–0.5)
Eosinophils Relative: 4 %
HCT: 41.8 % (ref 39.0–52.0)
Hemoglobin: 13.2 g/dL (ref 13.0–17.0)
Immature Granulocytes: 0 %
Lymphocytes Relative: 26 %
Lymphs Abs: 1.2 10*3/uL (ref 0.7–4.0)
MCH: 34.1 pg — ABNORMAL HIGH (ref 26.0–34.0)
MCHC: 31.6 g/dL (ref 30.0–36.0)
MCV: 108 fL — ABNORMAL HIGH (ref 80.0–100.0)
Monocytes Absolute: 0.3 10*3/uL (ref 0.1–1.0)
Monocytes Relative: 7 %
Neutro Abs: 2.9 10*3/uL (ref 1.7–7.7)
Neutrophils Relative %: 63 %
Platelets: 196 10*3/uL (ref 150–400)
RBC: 3.87 MIL/uL — ABNORMAL LOW (ref 4.22–5.81)
RDW: 13.4 % (ref 11.5–15.5)
WBC: 4.7 10*3/uL (ref 4.0–10.5)
nRBC: 0 % (ref 0.0–0.2)

## 2019-12-11 LAB — VITAMIN B12: Vitamin B-12: 246 pg/mL (ref 180–914)

## 2019-12-11 LAB — FOLATE: Folate: 27.7 ng/mL (ref >5.9)

## 2019-12-11 MED ORDER — ENOXAPARIN SODIUM 40 MG/0.4ML ~~LOC~~ SOLN: 40.0000 mg | SUBCUTANEOUS | Status: DC

## 2019-12-11 MED ORDER — METHOCARBAMOL 500 MG PO TABS
500.0000 mg | ORAL_TABLET | Freq: Three times a day (TID) | ORAL | Status: DC | PRN
  Administered 2019-12-11 – 2019-12-12 (×3): 500 mg via ORAL
  Filled 2019-12-11 (×3): qty 1

## 2019-12-11 MED ORDER — ZOLPIDEM TARTRATE 5 MG PO TABS
5.0000 mg | ORAL_TABLET | Freq: Once | ORAL | Status: AC
  Administered 2019-12-11: 5 mg via ORAL
  Filled 2019-12-11: qty 1

## 2019-12-11 MED ORDER — DEXTROSE-NACL 5-0.45 % IV SOLN: INTRAVENOUS | Status: DC

## 2019-12-11 MED ORDER — VITAMIN B-12 1000 MCG PO TABS
1000.0000 ug | ORAL_TABLET | Freq: Every day | ORAL | Status: DC
  Administered 2019-12-11 – 2019-12-13 (×3): 1000 ug via ORAL
  Filled 2019-12-11 (×3): qty 1

## 2019-12-11 NOTE — Plan of Care (Signed)

## 2019-12-11 NOTE — Plan of Care (Signed)
  Problem: Activity: Goal: Risk for activity intolerance will decrease Outcome: Progressing   Problem: Coping: Goal: Level of anxiety will decrease Outcome: Progressing   Problem: Pain Managment: Goal: General experience of comfort will improve Outcome: Progressing   Problem: Safety: Goal: Ability to remain free from injury will improve Outcome: Progressing   Problem: Skin Integrity: Goal: Risk for impaired skin integrity will decrease Outcome: Progressing   
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
## 2019-12-12 LAB — CBC WITH DIFFERENTIAL/PLATELET
Abs Immature Granulocytes: 0.01 10*3/uL (ref 0.00–0.07)
Basophils Absolute: 0 10*3/uL (ref 0.0–0.1)
Basophils Relative: 0 %
Eosinophils Absolute: 0.2 10*3/uL (ref 0.0–0.5)
Eosinophils Relative: 6 %
HCT: 40 % (ref 39.0–52.0)
Hemoglobin: 13 g/dL (ref 13.0–17.0)
Immature Granulocytes: 0 %
Lymphocytes Relative: 31 %
Lymphs Abs: 1.3 10*3/uL (ref 0.7–4.0)
MCH: 34 pg (ref 26.0–34.0)
MCHC: 32.5 g/dL (ref 30.0–36.0)
MCV: 104.7 fL — ABNORMAL HIGH (ref 80.0–100.0)
Monocytes Absolute: 0.4 10*3/uL (ref 0.1–1.0)
Monocytes Relative: 10 %
Neutro Abs: 2.1 10*3/uL (ref 1.7–7.7)
Neutrophils Relative %: 53 %
Platelets: 193 10*3/uL (ref 150–400)
RBC: 3.82 MIL/uL — ABNORMAL LOW (ref 4.22–5.81)
RDW: 12.9 % (ref 11.5–15.5)
WBC: 4 10*3/uL (ref 4.0–10.5)
nRBC: 0 % (ref 0.0–0.2)

## 2019-12-12 LAB — NOVEL CORONAVIRUS, NAA (HOSP ORDER, SEND-OUT TO REF LAB; TAT 18-24 HRS): SARS-CoV-2, NAA: NOT DETECTED

## 2019-12-12 LAB — BASIC METABOLIC PANEL
Anion gap: 7 (ref 5–15)
BUN: 7 mg/dL (ref 6–20)
CO2: 30 mmol/L (ref 22–32)
Calcium: 8.6 mg/dL — ABNORMAL LOW (ref 8.9–10.3)
Chloride: 102 mmol/L (ref 98–111)
Creatinine, Ser: 1.12 mg/dL (ref 0.61–1.24)
GFR calc Af Amer: >60 mL/min (ref >60)
GFR calc non Af Amer: >60 mL/min (ref >60)
Glucose, Bld: 159 mg/dL — ABNORMAL HIGH (ref 70–99)
Potassium: 3.5 mmol/L (ref 3.5–5.1)
Sodium: 139 mmol/L (ref 135–145)
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
## 2019-12-12 NOTE — Progress Notes (Signed)
Patient ID: David Benson, male   DOB: 14-Nov-1965, 54 y.o.   MRN: [id number] [facility] Progress Note:   * No surgery found *  Subjective: Mental status is alert and oriented and responding appropriately Objective: Vital signs in last 24 hours: Temp:  [98.2 F (36.8 C)-98.5 F (36.9 C)] 98.5 F (36.9 C) (02/20 0527) Pulse Rate:  [49-65] 52 (02/20 0527) Resp:  [17-18] 18 (02/20 0527) BP: (125-145)/(80-86) 144/80 (02/20 0527) SpO2:  [93 %-98 %] 98 % (02/20 0527)  Intake/Output from previous day: 02/19 0701 - 02/20 0700 In: 2309.8 [P.O.:924; I.V.:1085.8; IV Piggyback:300] Out: 0  Intake/Output this shift: No intake/output data recorded.  Physical Exam: Work of breathing is not labored;  Taking liquids with scant flatus.  Lab Results:  Results for orders placed or performed during the hospital encounter of 12/09/19 (from the past 48 hour(s))  CBC with Differential/Platelet     Status: Abnormal   Collection Time: 12/11/19  2:49 AM  Result Value Ref Range   WBC 4.7 4.0 - 10.5 K/uL   RBC 3.87 (L) 4.22 - 5.81 MIL/uL   Hemoglobin 13.2 13.0 - 17.0 g/dL   HCT 65.9 93.5 - 70.1 %   MCV 108.0 (H) 80.0 - 100.0 fL   MCH 34.1 (H) 26.0 - 34.0 pg   MCHC 31.6 30.0 - 36.0 g/dL   RDW 77.9 39.0 - 30.0 %   Platelets 196 150 - 400 K/uL   nRBC 0.0 0.0 - 0.2 %   Neutrophils Relative % 63 %   Neutro Abs 2.9 1.7 - 7.7 K/uL   Lymphocytes Relative 26 %   Lymphs Abs 1.2 0.7 - 4.0 K/uL   Monocytes Relative 7 %   Monocytes Absolute 0.3 0.1 - 1.0 K/uL   Eosinophils Relative 4 %   Eosinophils Absolute 0.2 0.0 - 0.5 K/uL   Basophils Relative 0 %   Basophils Absolute 0.0 0.0 - 0.1 K/uL   Immature Granulocytes 0 %   Abs Immature Granulocytes 0.01 0.00 - 0.07 K/uL    Comment: Performed at [facility], [address] [place], [place] [address]  Basic metabolic panel     Status: Abnormal   Collection Time: 12/11/19  2:49 AM  Result Value Ref Range   Sodium 145 135 - 145 mmol/L   Potassium 3.8 3.5 - 5.1 mmol/L   Chloride 106 98 - 111 mmol/L   CO2 28 22 - 32 mmol/L   Glucose, Bld 67 (L) 70 - 99 mg/dL   BUN 6 6 - 20 mg/dL   Creatinine, Ser 0.76 0.61 - 1.24 mg/dL   Calcium 8.7 (L) 8.9 - 10.3 mg/dL   GFR calc non Af Amer >60 >60 mL/min   GFR calc Af Amer >60 >60 mL/min   Anion gap 11 5 - 15    Comment: Performed at [facility], [address], [place], [place] [address]  Vitamin B12     Status: None   Collection Time: 12/11/19  2:49 AM  Result Value Ref Range   Vitamin B-12 246 180 - 914 pg/mL    Comment: (NOTE) This assay is not validated for testing neonatal or myeloproliferative syndrome specimens for Vitamin B12 levels. Performed at [facility], [address], [place], [place] [address]   Folate     Status: None   Collection Time: 12/11/19  2:49 AM  Result Value Ref Range   Folate 27.7 >5.9 ng/mL    Comment: Performed at [facility], [address] [place], [place] [address]  Iron and TIBC     Status: None   Collection Time: 12/11/19  2:49 AM  Result Value Ref Range   Iron 91 45 - 182 ug/dL   TIBC 354 656 - 812 ug/dL   Saturation Ratios 31 17.9 - 39.5 %   UIBC 199 ug/dL    Comment: Performed at [facility], [address] [place], [place] [address]  Ferritin     Status: None   Collection Time: 12/11/19  2:49 AM  Result Value Ref Range   Ferritin 63 24 - 336 ng/mL    Comment: Performed at [facility], [address], [place], [place] [address]  CBC with Differential/Platelet     Status: Abnormal   Collection Time: 12/12/19  1:45 AM  Result Value Ref Range   WBC 4.0 4.0 - 10.5 K/uL   RBC 3.82 (L) 4.22 - 5.81 MIL/uL   Hemoglobin 13.0 13.0 - 17.0 g/dL   HCT 44.9 67.5 - 91.6 %   MCV 104.7 (H) 80.0 - 100.0 fL   MCH 34.0 26.0 - 34.0 pg   MCHC 32.5 30.0 - 36.0 g/dL   RDW 38.4 66.5 - 99.3 %   Platelets 193 150 - 400 K/uL   nRBC 0.0 0.0 - 0.2 %   Neutrophils Relative % 53 %   Neutro Abs 2.1 1.7 - 7.7 K/uL    Lymphocytes Relative 31 %   Lymphs Abs 1.3 0.7 - 4.0 K/uL   Monocytes Relative 10 %   Monocytes Absolute 0.4 0.1 - 1.0 K/uL   Eosinophils Relative 6 %   Eosinophils Absolute 0.2 0.0 - 0.5 K/uL   Basophils Relative 0 %   Basophils Absolute 0.0 0.0 - 0.1 K/uL   Immature Granulocytes 0 %   Abs Immature Granulocytes 0.01 0.00 - 0.07 K/uL    Comment: Performed at [facility], [address], [place], [place] [address]  Basic metabolic panel     Status: Abnormal   Collection Time: 12/12/19  1:45 AM  Result Value Ref Range   Sodium 139 135 - 145 mmol/L   Potassium 3.5 3.5 - 5.1 mmol/L   Chloride 102 98 - 111 mmol/L   CO2 30 22 - 32 mmol/L   Glucose, Bld 159 (H) 70 - 99 mg/dL   BUN 7 6 - 20 mg/dL   Creatinine, Ser 7.93 0.61 - 1.24 mg/dL   Calcium 8.6 (L) 8.9 - 10.3 mg/dL   GFR calc non Af Amer >60 >60 mL/min   GFR calc Af Amer >60 >60 mL/min   Anion gap 7 5 - 15    Comment: Performed at [facility], [address] [place], [place] [address]    Radiology/Results: DG Abd Portable 1V-Small Bowel Obstruction Protocol-initial, 8 hr delay  Result Date: 12/10/2019 CLINICAL DATA:  8 hour delayed film.  Small bowel follow-through. EXAM: PORTABLE ABDOMEN - 1 VIEW COMPARISON:  December 09, 2019 FINDINGS: There is contrast in the colon to the level the rectum. All the contrast filled colon is in the right side of the abdomen. [place] is seen over the left side of the abdomen. No convincing evidence of obstruction on this study. IMPRESSION: Contrast has reached the rectum. No convincing evidence of high-grade obstruction. Electronically Signed   By: Gerome Sam III M.D   On: 12/10/2019 20:08    Anti-infectives: Anti-infectives (From admission, onward)   None  Assessment/Plan: Problem List: Patient Active Problem List   Diagnosis Date Noted  . Aspiration pneumonia ([place]) 02/20/2019  . Lactic acid acidosis 02/20/2019  . Sepsis, Gram negative ([place]) 02/20/2019  . Acute  renal failure (ARF) ([place]) 02/20/2019  . Alcoholic hepatitis without ascites 02/20/2019  . Post-ictal state ([place]) 10/06/2018  . SBO (small bowel obstruction) ([place]) 08/10/2016  . Polysubstance abuse ([place]) 08/10/2016  . 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 Seizure disorder ([place]) 11/23/2015  . Poor dentition 11/23/2015  . Macrocytic anemia   . Dysphagia   . Dyshidrotic eczema 06/11/2012  . Tobacco abuse 06/10/2012  . Oral candidiasis 05/20/2012  . Tinea corporis 05/20/2012  . 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 Alcohol abuse 01/24/2010  . DEPRESSION 01/24/2010  . COPD (chronic obstructive pulmonary disease) ([place]) 01/24/2010  . GERD 01/24/2010  . OSTEOARTHRITIS 01/24/2010  . Personal history of other disorders of nervous system and sense organs 01/24/2010    Would expand diet slowly.   * No surgery found *    LOS: 3 days   Matt B. Hassell Done, MD, [facility] [phone] beeper [phone]  12/12/2019 9:25 AM

## 2019-12-13 ENCOUNTER — Encounter (HOSPITAL_COMMUNITY): Payer: Self-pay | Admitting: *Deleted

## 2019-12-13 LAB — CBC WITH DIFFERENTIAL/PLATELET
Abs Immature Granulocytes: 0.01 10*3/uL (ref 0.00–0.07)
Basophils Absolute: 0 10*3/uL (ref 0.0–0.1)
Basophils Relative: 1 %
Eosinophils Absolute: 0.2 10*3/uL (ref 0.0–0.5)
Eosinophils Relative: 6 %
HCT: 39.8 % (ref 39.0–52.0)
Hemoglobin: 13 g/dL (ref 13.0–17.0)
Immature Granulocytes: 0 %
Lymphocytes Relative: 35 %
Lymphs Abs: 1.4 10*3/uL (ref 0.7–4.0)
MCH: 34.2 pg — ABNORMAL HIGH (ref 26.0–34.0)
MCHC: 32.7 g/dL (ref 30.0–36.0)
MCV: 104.7 fL — ABNORMAL HIGH (ref 80.0–100.0)
Monocytes Absolute: 0.5 10*3/uL (ref 0.1–1.0)
Monocytes Relative: 13 %
Neutro Abs: 1.8 10*3/uL (ref 1.7–7.7)
Neutrophils Relative %: 45 %
Platelets: 186 10*3/uL (ref 150–400)
RBC: 3.8 MIL/uL — ABNORMAL LOW (ref 4.22–5.81)
RDW: 12.9 % (ref 11.5–15.5)
WBC: 3.9 10*3/uL — ABNORMAL LOW (ref 4.0–10.5)
nRBC: 0 % (ref 0.0–0.2)

## 2019-12-13 LAB — BASIC METABOLIC PANEL
Anion gap: 8 (ref 5–15)
BUN: <5 mg/dL — ABNORMAL LOW (ref 6–20)
CO2: 28 mmol/L (ref 22–32)
Calcium: 8.6 mg/dL — ABNORMAL LOW (ref 8.9–10.3)
Chloride: 104 mmol/L (ref 98–111)
Creatinine, Ser: 1.23 mg/dL (ref 0.61–1.24)
GFR calc Af Amer: >60 mL/min (ref >60)
GFR calc non Af Amer: >60 mL/min (ref >60)
Glucose, Bld: 116 mg/dL — ABNORMAL HIGH (ref 70–99)
Potassium: 3.8 mmol/L (ref 3.5–5.1)
Sodium: 140 mmol/L (ref 135–145)

## 2019-12-13 MED ORDER — CYANOCOBALAMIN 1000 MCG PO TABS: 1000.0000 ug | ORAL_TABLET | Freq: Every day | ORAL | 0 refills | Status: AC

## 2019-12-13 NOTE — Discharge Instructions (Signed)
Bowel Obstruction °A bowel obstruction means that something is blocking the small or large bowel. The bowel is also called the intestine. It is the long tube that connects the stomach to the opening of the butt (anus). When something blocks the bowel, food and fluids cannot pass through like normal. This condition needs to be treated. Treatment depends on the cause of the problem and how bad the problem is. °What are the causes? °Common causes of this condition include: °· Scar tissue (adhesions) from past surgery or from high-energy X-rays (radiation). °· Recent surgery in the belly. This affects how food moves in the bowel. °· Some diseases, such as: °? Irritation of the lining of the digestive tract (Crohn's disease). °? Irritation of small pouches in the bowel (diverticulitis). °· Growths or tumors. °· A bulging organ (hernia). °· Twisting of the bowel (volvulus). °· A foreign body. °· Slipping of a part of the bowel into another part (intussusception). °What are the signs or symptoms? °Symptoms of this condition include: °· Pain in the belly. °· Feeling sick to your stomach (nauseous). °· Throwing up (vomiting). °· Bloating in the belly. °· Being unable to pass gas. °· Trouble pooping (constipation). °· Watery poop (diarrhea). °· A lot of belching. °How is this diagnosed? °This condition may be diagnosed based on: °· A physical exam. °· Medical history. °· Imaging tests, such as X-ray or CT scan. °· Blood tests. °· Urine tests. °How is this treated? °Treatment for this condition may include: °· Fluids and pain medicines that are given through an IV tube. Your doctor may tell you not to eat or drink if you feel sick to your stomach and are throwing up. °· Eating a clear liquid diet for a few days. °· Putting a small tube (nasogastric tube) into the stomach. This will help with pain, discomfort, and nausea by removing blocked air and fluids from the stomach. °· Surgery. This may be needed if other treatments do  not work. °Follow these instructions at home: °Medicines °· Take over-the-counter and prescription medicines only as told by your doctor. °· If you were prescribed an antibiotic medicine, take it as told by your doctor. Do not stop taking the antibiotic even if you start to feel better. °General instructions °· Follow your diet as told by your doctor. You may need to: °? Only drink clear liquids until you start to get better. °? Avoid solid foods. °· Return to your normal activities as told by your doctor. Ask your doctor what activities are safe for you. °· Do not sit for a long time without moving. Get up to take short walks every 1-2 hours. This is important. Ask for help if you feel weak or unsteady. °· Keep all follow-up visits as told by your doctor. This is important. °How is this prevented? °After having a bowel obstruction, you may be more likely to have another. You can do some things to stop it from happening again. °· If you have a long-term (chronic) disease, contact your doctor if you see changes or problems. °· Take steps to prevent or treat trouble pooping. Your doctor may ask that you: °? Drink enough fluid to keep your pee (urine) pale yellow. °? Take over-the-counter or prescription medicines. °? Eat foods that are high in fiber. These include beans, whole grains, and fresh fruits and vegetables. °? Limit foods that are high in fat and sugar. These include fried or sweet foods. °· Stay active. Ask your doctor which exercises are   safe for you. °· Avoid stress. °· Eat three small meals and three small snacks each day. °· Work with a food expert (dietitian) to make a meal plan that works for you. °· Do not use any products that contain nicotine or tobacco, such as cigarettes and e-cigarettes. If you need help quitting, ask your doctor. °Contact a doctor if: °· You have a fever. °· You have chills. °Get help right away if: °· You have pain or cramps that get worse. °· You throw up blood. °· You are  sick to your stomach. °· You cannot stop throwing up. °· You cannot drink fluids. °· You feel mixed up (confused). °· You feel very thirsty (dehydrated). °· Your belly gets more bloated. °· You feel weak or you pass out (faint). °Summary °· A bowel obstruction means that something is blocking the small or large bowel. °· Treatment may include IV fluids and pain medicine. You may also have a clear liquid diet, a small tube in your stomach, or surgery. °· Drink clear liquids and avoid solid foods until you get better. °This information is not intended to replace advice given to you by your health care provider. Make sure you discuss any questions you have with your health care provider. °Document Revised: 02/19/2018 Document Reviewed: 02/19/2018 °Elsevier Patient Education © 2020 [employer] ° °

## 2019-12-13 NOTE — Plan of Care (Signed)
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
## 2019-12-13 NOTE — Progress Notes (Signed)
Patient ID: David Benson, male   DOB: [birth year], 54 y.o.   MRN: [id number] [facility] Surgery Progress Note:   * No surgery found *  Subjective: Mental status is clear and he is hungry Objective: Vital signs in last 24 hours: Temp:  [98 F (36.7 C)-98.6 F (37 C)] 98 F (36.7 C) (02/21 0508) Pulse Rate:  [51-54] 51 (02/21 0508) Resp:  [16-18] 18 (02/21 0508) BP: (107-152)/(73-96) 107/73 (02/21 0508) SpO2:  [93 %-99 %] 93 % (02/21 0508)  Intake/Output from previous day: 02/20 0701 - 02/21 0700 In: 1600 [P.O.:1600] Out: -  Intake/Output this shift: No intake/output data recorded.  Physical Exam: Work of breathing is normal.  Abdomen is soft and nontenders.    Lab Results:  Results for orders placed or performed during the hospital encounter of 12/09/19 (from the past 48 hour(s))  CBC with Differential/Platelet     Status: Abnormal   Collection Time: 12/12/19  1:45 AM  Result Value Ref Range   WBC 4.0 4.0 - 10.5 K/uL   RBC 3.82 (L) 4.22 - 5.81 MIL/uL   Hemoglobin 13.0 13.0 - 17.0 g/dL   HCT 43.1 54.0 - 08.6 %   MCV 104.7 (H) 80.0 - 100.0 fL   MCH 34.0 26.0 - 34.0 pg   MCHC 32.5 30.0 - 36.0 g/dL   RDW 76.1 95.0 - 93.2 %   Platelets 193 150 - 400 K/uL   nRBC 0.0 0.0 - 0.2 %   Neutrophils Relative % 53 %   Neutro Abs 2.1 1.7 - 7.7 K/uL   Lymphocytes Relative 31 %   Lymphs Abs 1.3 0.7 - 4.0 K/uL   Monocytes Relative 10 %   Monocytes Absolute 0.4 0.1 - 1.0 K/uL   Eosinophils Relative 6 %   Eosinophils Absolute 0.2 0.0 - 0.5 K/uL   Basophils Relative 0 %   Basophils Absolute 0.0 0.0 - 0.1 K/uL   Immature Granulocytes 0 %   Abs Immature Granulocytes 0.01 0.00 - 0.07 K/uL    Comment: Performed at [facility], [address] [place], [place] [address]  Basic metabolic panel     Status: Abnormal   Collection Time: 12/12/19  1:45 AM  Result Value Ref Range   Sodium 139 135 - 145 mmol/L   Potassium 3.5 3.5 - 5.1 mmol/L   Chloride 102 98 - 111 mmol/L   CO2  30 22 - 32 mmol/L   Glucose, Bld 159 (H) 70 - 99 mg/dL   BUN 7 6 - 20 mg/dL   Creatinine, Ser 5.80 0.61 - 1.24 mg/dL   Calcium 8.6 (L) 8.9 - 10.3 mg/dL   GFR calc non Af Amer >60 >60 mL/min   GFR calc Af Amer >60 >60 mL/min   Anion gap 7 5 - 15    Comment: Performed at [facility], [address], [place], [place] [address]  CBC with Differential/Platelet     Status: Abnormal   Collection Time: 12/13/19  3:00 AM  Result Value Ref Range   WBC 3.9 (L) 4.0 - 10.5 K/uL   RBC 3.80 (L) 4.22 - 5.81 MIL/uL   Hemoglobin 13.0 13.0 - 17.0 g/dL   HCT 82.5 05.3 - 97.6 %   MCV 104.7 (H) 80.0 - 100.0 fL   MCH 34.2 (H) 26.0 - 34.0 pg   MCHC 32.7 30.0 - 36.0 g/dL   RDW 73.4 19.3 - 79.0 %   Platelets 186 150 - 400 K/uL   nRBC 0.0  0.0 - 0.2 %   Neutrophils Relative % 45 %   Neutro Abs 1.8 1.7 - 7.7 K/uL   Lymphocytes Relative 35 %   Lymphs Abs 1.4 0.7 - 4.0 K/uL   Monocytes Relative 13 %   Monocytes Absolute 0.5 0.1 - 1.0 K/uL   Eosinophils Relative 6 %   Eosinophils Absolute 0.2 0.0 - 0.5 K/uL   Basophils Relative 1 %   Basophils Absolute 0.0 0.0 - 0.1 K/uL   Immature Granulocytes 0 %   Abs Immature Granulocytes 0.01 0.00 - 0.07 K/uL    Comment: Performed at [facility] [address] [place], [place] [address]  Basic metabolic panel     Status: Abnormal   Collection Time: 12/13/19  3:00 AM  Result Value Ref Range   Sodium 140 135 - 145 mmol/L   Potassium 3.8 3.5 - 5.1 mmol/L   Chloride 104 98 - 111 mmol/L   CO2 28 22 - 32 mmol/L   Glucose, Bld 116 (H) 70 - 99 mg/dL   BUN <5 (L) 6 - 20 mg/dL   Creatinine, Ser 1.23 0.61 - 1.24 mg/dL   Calcium 8.6 (L) 8.9 - 10.3 mg/dL   GFR calc non Af Amer >60 >60 mL/min   GFR calc Af Amer >60 >60 mL/min   Anion gap 8 5 - 15    Comment: Performed at [facility], [place] [address] [place], [place] [address]    Radiology/Results: No results found.  Anti-infectives: Anti-infectives (From admission, onward)   None       Assessment/Plan: Problem List: Patient Active Problem List   Diagnosis Date Noted  . Aspiration pneumonia ([place]) 02/20/2019  . Lactic acid acidosis 02/20/2019  . Sepsis, Gram negative ([place]) 02/20/2019  . Acute renal failure (ARF) ([place]) 02/20/2019  . Alcoholic hepatitis without ascites 02/20/2019  . Post-ictal state ([place]) 10/06/2018  . SBO (small bowel obstruction) ([place]) 08/10/2016  . Polysubstance abuse ([place]) 08/10/2016  . 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 Seizure disorder ([place]) 11/23/2015  . Poor dentition 11/23/2015  . Macrocytic anemia   . Dysphagia   . Dyshidrotic eczema 06/11/2012  . Tobacco abuse 06/10/2012  . Oral candidiasis 05/20/2012  . Tinea corporis 05/20/2012  . 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 Alcohol abuse 01/24/2010  . DEPRESSION 01/24/2010  . COPD (chronic obstructive pulmonary disease) ([employer]) 01/24/2010  . GERD 01/24/2010  . OSTEOARTHRITIS 01/24/2010  . Personal history of other disorders of nervous system and sense organs 01/24/2010    Advance diet * No surgery found *    LOS: 4 days   Matt B. Hassell Done, MD, [facility] [phone] beeper [phone]  12/13/2019 9:15 AM

## 2020-03-19 ENCOUNTER — Ambulatory Visit (HOSPITAL_COMMUNITY)
Admission: EM | Admit: 2020-03-19 | Discharge: 2020-03-19 | Disposition: A | Discharge status: Nursing Home (Includes ICF) | Payer: Self-pay

## 2020-03-19 ENCOUNTER — Encounter (HOSPITAL_COMMUNITY): Payer: Self-pay | Admitting: Emergency Medicine

## 2020-03-19 ENCOUNTER — Inpatient Hospital Stay (HOSPITAL_COMMUNITY): Payer: Self-pay

## 2020-03-19 ENCOUNTER — Emergency Department (HOSPITAL_COMMUNITY): Payer: Self-pay

## 2020-03-19 ENCOUNTER — Inpatient Hospital Stay (HOSPITAL_COMMUNITY)
Admission: EM | Admit: 2020-03-19 | Discharge: 2020-03-22 | DRG: 390 | Disposition: A | Discharge status: Home / Self Care | Payer: Self-pay | Attending: Surgery | Admitting: Surgery

## 2020-03-19 ENCOUNTER — Other Ambulatory Visit: Payer: Self-pay

## 2020-03-19 DIAGNOSIS — Z91048 Other nonmedicinal substance allergy status: Secondary | ICD-10-CM

## 2020-03-19 DIAGNOSIS — K56609 Unspecified intestinal obstruction, unspecified as to partial versus complete obstruction: Secondary | ICD-10-CM | POA: Diagnosis present

## 2020-03-19 DIAGNOSIS — M1990 Unspecified osteoarthritis, unspecified site: Secondary | ICD-10-CM | POA: Diagnosis present

## 2020-03-19 DIAGNOSIS — Z888 Allergy status to other drugs, medicaments and biological substances status: Secondary | ICD-10-CM

## 2020-03-19 DIAGNOSIS — J449 Chronic obstructive pulmonary disease, unspecified: Secondary | ICD-10-CM | POA: Diagnosis present

## 2020-03-19 DIAGNOSIS — K56600 Partial intestinal obstruction, unspecified as to cause: Principal | ICD-10-CM | POA: Diagnosis present

## 2020-03-19 DIAGNOSIS — Z20822 Contact with and (suspected) exposure to covid-19: Secondary | ICD-10-CM | POA: Diagnosis present

## 2020-03-19 DIAGNOSIS — Z79899 Other long term (current) drug therapy: Secondary | ICD-10-CM

## 2020-03-19 DIAGNOSIS — K219 Gastro-esophageal reflux disease without esophagitis: Secondary | ICD-10-CM | POA: Diagnosis present

## 2020-03-19 DIAGNOSIS — Z0189 Encounter for other specified special examinations: Secondary | ICD-10-CM

## 2020-03-19 DIAGNOSIS — K439 Ventral hernia without obstruction or gangrene: Secondary | ICD-10-CM | POA: Diagnosis present

## 2020-03-19 DIAGNOSIS — Z905 Acquired absence of kidney: Secondary | ICD-10-CM

## 2020-03-19 DIAGNOSIS — Z9049 Acquired absence of other specified parts of digestive tract: Secondary | ICD-10-CM

## 2020-03-19 DIAGNOSIS — Z880 Allergy status to penicillin: Secondary | ICD-10-CM

## 2020-03-19 DIAGNOSIS — F17210 Nicotine dependence, cigarettes, uncomplicated: Secondary | ICD-10-CM | POA: Diagnosis present

## 2020-03-19 DIAGNOSIS — G40909 Epilepsy, unspecified, not intractable, without status epilepticus: Secondary | ICD-10-CM | POA: Diagnosis present

## 2020-03-19 DIAGNOSIS — R1084 Generalized abdominal pain: Secondary | ICD-10-CM

## 2020-03-19 DIAGNOSIS — Z91018 Allergy to other foods: Secondary | ICD-10-CM

## 2020-03-19 LAB — CBC
HCT: 43.5 % (ref 39.0–52.0)
Hemoglobin: 14.4 g/dL (ref 13.0–17.0)
MCH: 34.5 pg — ABNORMAL HIGH (ref 26.0–34.0)
MCHC: 33.1 g/dL (ref 30.0–36.0)
MCV: 104.3 fL — ABNORMAL HIGH (ref 80.0–100.0)
Platelets: 234 10*3/uL (ref 150–400)
RBC: 4.17 MIL/uL — ABNORMAL LOW (ref 4.22–5.81)
RDW: 13.8 % (ref 11.5–15.5)
WBC: 5 10*3/uL (ref 4.0–10.5)
nRBC: 0 % (ref 0.0–0.2)

## 2020-03-19 LAB — COMPREHENSIVE METABOLIC PANEL
ALT: 28 U/L (ref 0–44)
AST: 47 U/L — ABNORMAL HIGH (ref 15–41)
Albumin: 4 g/dL (ref 3.5–5.0)
Alkaline Phosphatase: 67 U/L (ref 38–126)
Anion gap: 11 (ref 5–15)
BUN: 11 mg/dL (ref 6–20)
CO2: 27 mmol/L (ref 22–32)
Calcium: 9.3 mg/dL (ref 8.9–10.3)
Chloride: 103 mmol/L (ref 98–111)
Creatinine, Ser: 1.18 mg/dL (ref 0.61–1.24)
GFR calc Af Amer: >60 mL/min (ref >60)
GFR calc non Af Amer: >60 mL/min (ref >60)
Glucose, Bld: 75 mg/dL (ref 70–99)
Potassium: 4.3 mmol/L (ref 3.5–5.1)
Sodium: 141 mmol/L (ref 135–145)
Total Bilirubin: 0.8 mg/dL (ref 0.3–1.2)
Total Protein: 7.2 g/dL (ref 6.5–8.1)

## 2020-03-19 LAB — URINALYSIS, ROUTINE W REFLEX MICROSCOPIC
Bacteria, UA: NONE SEEN
Bilirubin Urine: NEGATIVE
Glucose, UA: NEGATIVE mg/dL
Hgb urine dipstick: NEGATIVE
Ketones, ur: 5 mg/dL — AB
Leukocytes,Ua: NEGATIVE
Nitrite: NEGATIVE
Protein, ur: 30 mg/dL — AB
Specific Gravity, Urine: 1.027 (ref 1.005–1.030)
pH: 5 (ref 5.0–8.0)

## 2020-03-19 LAB — SARS CORONAVIRUS 2 BY RT PCR (HOSPITAL ORDER, PERFORMED IN ~~LOC~~ HOSPITAL LAB): SARS Coronavirus 2: NEGATIVE

## 2020-03-19 LAB — LIPASE, BLOOD: Lipase: 22 U/L (ref 11–51)

## 2020-03-19 MED ORDER — DIATRIZOATE MEGLUMINE & SODIUM 66-10 % PO SOLN
90.0000 mL | Freq: Once | ORAL | Status: AC
  Administered 2020-03-19: 90 mL via NASOGASTRIC
  Filled 2020-03-19: qty 90

## 2020-03-19 MED ORDER — SODIUM CHLORIDE 0.9% FLUSH
3.0000 mL | Freq: Once | INTRAVENOUS | Status: AC
  Administered 2020-03-19: 3 mL via INTRAVENOUS

## 2020-03-19 MED ORDER — ALBUTEROL SULFATE (2.5 MG/3ML) 0.083% IN NEBU: 2.5000 mg | INHALATION_SOLUTION | Freq: Four times a day (QID) | RESPIRATORY_TRACT | Status: DC | PRN

## 2020-03-19 MED ORDER — MORPHINE SULFATE (PF) 2 MG/ML IV SOLN: 2.0000 mg | INTRAVENOUS | Status: DC | PRN

## 2020-03-19 MED ORDER — ONDANSETRON HCL 4 MG/2ML IJ SOLN
4.0000 mg | Freq: Once | INTRAMUSCULAR | Status: AC
  Administered 2020-03-19: 4 mg via INTRAVENOUS
  Filled 2020-03-19: qty 2

## 2020-03-19 MED ORDER — ACETAMINOPHEN 325 MG PO TABS: 650.0000 mg | ORAL_TABLET | Freq: Four times a day (QID) | ORAL | Status: DC | PRN

## 2020-03-19 MED ORDER — ACETAMINOPHEN 650 MG RE SUPP: 650.0000 mg | Freq: Four times a day (QID) | RECTAL | Status: DC | PRN

## 2020-03-19 MED ORDER — ONDANSETRON 4 MG PO TBDP: 4.0000 mg | ORAL_TABLET | Freq: Four times a day (QID) | ORAL | Status: DC | PRN

## 2020-03-19 MED ORDER — MORPHINE SULFATE (PF) 2 MG/ML IV SOLN
2.0000 mg | INTRAVENOUS | Status: DC | PRN
  Administered 2020-03-19 – 2020-03-20 (×2): 2 mg via INTRAVENOUS
  Administered 2020-03-20: 4 mg via INTRAVENOUS
  Administered 2020-03-20 – 2020-03-21 (×4): 2 mg via INTRAVENOUS
  Administered 2020-03-21: 4 mg via INTRAVENOUS
  Administered 2020-03-21 – 2020-03-22 (×6): 2 mg via INTRAVENOUS
  Filled 2020-03-19: qty 2
  Filled 2020-03-19 (×2): qty 1
  Filled 2020-03-19: qty 2
  Filled 2020-03-19 (×10): qty 1

## 2020-03-19 MED ORDER — SODIUM CHLORIDE 0.9 % IV SOLN: INTRAVENOUS | Status: DC

## 2020-03-19 MED ORDER — PROCHLORPERAZINE MALEATE 10 MG PO TABS
10.0000 mg | ORAL_TABLET | Freq: Four times a day (QID) | ORAL | Status: DC | PRN
  Filled 2020-03-19: qty 1

## 2020-03-19 MED ORDER — HYDROMORPHONE HCL 1 MG/ML IJ SOLN
1.0000 mg | Freq: Once | INTRAMUSCULAR | Status: AC
  Administered 2020-03-19: 1 mg via INTRAVENOUS
  Filled 2020-03-19: qty 1

## 2020-03-19 MED ORDER — IOHEXOL 300 MG/ML  SOLN
100.0000 mL | Freq: Once | INTRAMUSCULAR | Status: AC | PRN
  Administered 2020-03-19: 100 mL via INTRAVENOUS

## 2020-03-19 MED ORDER — SIMETHICONE 80 MG PO CHEW: 40.0000 mg | CHEWABLE_TABLET | Freq: Four times a day (QID) | ORAL | Status: DC | PRN

## 2020-03-19 MED ORDER — LEVETIRACETAM IN NACL 1000 MG/100ML IV SOLN
1000.0000 mg | Freq: Two times a day (BID) | INTRAVENOUS | Status: DC
  Administered 2020-03-19 – 2020-03-22 (×6): 1000 mg via INTRAVENOUS
  Filled 2020-03-19 (×8): qty 100

## 2020-03-19 MED ORDER — ONDANSETRON HCL 4 MG/2ML IJ SOLN: 4.0000 mg | Freq: Four times a day (QID) | INTRAMUSCULAR | Status: DC | PRN

## 2020-03-19 MED ORDER — PROCHLORPERAZINE EDISYLATE 10 MG/2ML IJ SOLN: 5.0000 mg | Freq: Four times a day (QID) | INTRAMUSCULAR | Status: DC | PRN

## 2020-03-19 NOTE — ED Triage Notes (Addendum)
C/o sharp abd pain with nausea and vomiting since this morning.  Last BM yesterday.

## 2020-03-19 NOTE — ED Notes (Signed)
Patient transported to CT 
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
## 2020-03-19 NOTE — ED Provider Notes (Addendum)
Pt complains of abdominal pain and vomiting.  Pt reports he has a history of bowel obstructions that have required hospitalization.  Pt reports last episode was in January Pt reports this feels like a bowel obstruction.  Pt is unable to eat or drink. ROS nausea  PE Resp normal Heart normal rate Vitals stable, abdomen diffusely tender, distended  Pt counseled. I advised pt to go to ED for evaluation.  Rn assisted pt in getting to ED    Elson Areas, PA-C 03/19/20 1046   Diagnosis  Acute abdominal pain Elson Areas, PA-C 03/23/20 1627    Elson Areas, [place] 06/20/20 1234
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
## 2020-03-19 NOTE — ED Triage Notes (Signed)
Pt reports significant abd pain since yesterday with onset of vomiting today PTA.  States he's "had this pain for years; I know it's a blockage again".  "Cannot remember" the last time he had BM. Pt reports hx intestinal obstructions with surgeries.   K. Keenan Bachelor, NP discussed with pt need for ED eval and care.  Pt verbalized understanding.  Patient is being discharged from the Urgent [facility] and sent to the Emergency Department via wheelchair by staff. Per K. Sofia, patient is stable but in need of higher level of care due to abd pain & vomiting with hx intestinal blockage and abd surgery. Patient is aware and verbalizes understanding of plan of care.

## 2020-03-20 ENCOUNTER — Inpatient Hospital Stay (HOSPITAL_COMMUNITY): Payer: Self-pay

## 2020-03-20 LAB — CBC
HCT: 42.6 % (ref 39.0–52.0)
Hemoglobin: 13.8 g/dL (ref 13.0–17.0)
MCH: 34.8 pg — ABNORMAL HIGH (ref 26.0–34.0)
MCHC: 32.4 g/dL (ref 30.0–36.0)
MCV: 107.6 fL — ABNORMAL HIGH (ref 80.0–100.0)
Platelets: 202 10*3/uL (ref 150–400)
RBC: 3.96 MIL/uL — ABNORMAL LOW (ref 4.22–5.81)
RDW: 14 % (ref 11.5–15.5)
WBC: 3.7 10*3/uL — ABNORMAL LOW (ref 4.0–10.5)
nRBC: 0 % (ref 0.0–0.2)

## 2020-03-20 LAB — BASIC METABOLIC PANEL
Anion gap: 11 (ref 5–15)
BUN: 10 mg/dL (ref 6–20)
CO2: 24 mmol/L (ref 22–32)
Calcium: 8.6 mg/dL — ABNORMAL LOW (ref 8.9–10.3)
Chloride: 107 mmol/L (ref 98–111)
Creatinine, Ser: 1.32 mg/dL — ABNORMAL HIGH (ref 0.61–1.24)
GFR calc Af Amer: >60 mL/min (ref >60)
GFR calc non Af Amer: >60 mL/min (ref >60)
Glucose, Bld: 53 mg/dL — ABNORMAL LOW (ref 70–99)
Potassium: 4.1 mmol/L (ref 3.5–5.1)
Sodium: 142 mmol/L (ref 135–145)
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
## 2020-03-21 NOTE — Progress Notes (Signed)
Pt. Requested that his diet be upgraded. Pt. Required pain medication 3 times last night. No other issues noted through out this shift.

## 2020-03-22 DIAGNOSIS — K56609 Unspecified intestinal obstruction, unspecified as to partial versus complete obstruction: Secondary | ICD-10-CM

## 2020-03-22 HISTORY — DX: Unspecified intestinal obstruction, unspecified as to partial versus complete obstruction: K56.609

## 2020-03-22 MED ORDER — ACETAMINOPHEN 500 MG PO TABS
1000.0000 mg | ORAL_TABLET | Freq: Four times a day (QID) | ORAL | Status: DC
  Administered 2020-03-22: 1000 mg via ORAL
  Filled 2020-03-22: qty 2

## 2020-03-22 MED ORDER — ONDANSETRON 4 MG PO TBDP: 4.0000 mg | ORAL_TABLET | Freq: Four times a day (QID) | ORAL | 0 refills | Status: DC | PRN

## 2020-03-22 MED ORDER — MORPHINE SULFATE (PF) 2 MG/ML IV SOLN: 2.0000 mg | INTRAVENOUS | Status: DC | PRN

## 2020-03-22 MED ORDER — OXYCODONE HCL 5 MG PO TABS
5.0000 mg | ORAL_TABLET | ORAL | Status: DC | PRN
  Administered 2020-03-22: 10 mg via ORAL
  Filled 2020-03-22: qty 2

## 2020-03-22 MED ORDER — METHOCARBAMOL 500 MG PO TABS
500.0000 mg | ORAL_TABLET | Freq: Three times a day (TID) | ORAL | Status: DC
  Administered 2020-03-22: 500 mg via ORAL
  Filled 2020-03-22: qty 1

## 2020-03-22 MED ORDER — ENOXAPARIN SODIUM 40 MG/0.4ML ~~LOC~~ SOLN
40.0000 mg | SUBCUTANEOUS | Status: DC
  Administered 2020-03-22: 40 mg via SUBCUTANEOUS
  Filled 2020-03-22: qty 0.4

## 2020-03-22 MED ORDER — OXYCODONE HCL 5 MG PO TABS: 5.0000 mg | ORAL_TABLET | Freq: Four times a day (QID) | ORAL | 0 refills | Status: DC | PRN

## 2020-03-22 MED FILL — ONDANSETRON ODT 4 MG TABLET: 4 | 5 days supply | Qty: 20 | Fill #0

## 2020-03-22 MED FILL — oxyCODONE HCL 5 MG TABS: 5 | 3 days supply | Qty: 12 | Fill #0

## 2020-03-22 NOTE — Care Management (Signed)
Prescriptions sent to [facility], entered in [facility] with no co pay. Placed [employer] [facility] information on AVS.  Ronny Flurry RN

## 2020-03-22 NOTE — Plan of Care (Signed)
Goals met 

## 2020-03-22 NOTE — Progress Notes (Signed)
       Subjective: CC: Patient notes that he is tolerating full liquid diet without any nausea or vomiting.  No abdominal pain.  Passing flatus.  BM this AM.  Objective: Vital signs in last 24 hours: Temp:  [97.9 F (36.6 C)-98.1 F (36.7 C)] 98.1 F (36.7 C) (06/01 0426) Pulse Rate:  [50-58] 50 (06/01 0426) Resp:  [18] 18 (06/01 0426) BP: (115-130)/(85-90) 124/86 (06/01 0426) SpO2:  [96 %-100 %] 100 % (06/01 0426) Last BM Date: 03/21/20  Intake/Output from previous day: 05/31 0701 - 06/01 0700 In: 2701 [P.O.:1720; I.V.:981] Out: 1800 [Urine:1800] Intake/Output this shift: Total I/O In: -  Out: 400 [Urine:400]  PE: Gen: Awake and alert, NAD Lungs: normal rate and effort Abd: Soft, ND, NT, +BS, prior abdominal scars noted Msk: no edema  Lab Results:  Recent Labs    03/19/20 1124 03/20/20 0313  WBC 5.0 3.7*  HGB 14.4 13.8  HCT 43.5 42.6  PLT 234 202   BMET Recent Labs    03/19/20 1124 03/20/20 0313  NA 141 142  K 4.3 4.1  CL 103 107  CO2 27 24  GLUCOSE 75 53*  BUN 11 10  CREATININE 1.18 1.32*  CALCIUM 9.3 8.6*   PT/INR No results for input(s): LABPROT, INR in the last 72 hours. CMP     Component Value Date/Time   NA 142 03/20/2020 0313   K 4.1 03/20/2020 0313   CL 107 03/20/2020 0313   CO2 24 03/20/2020 0313   GLUCOSE 53 (L) 03/20/2020 0313   BUN 10 03/20/2020 0313   CREATININE 1.32 (H) 03/20/2020 0313   CALCIUM 8.6 (L) 03/20/2020 0313   PROT 7.2 03/19/2020 1124   ALBUMIN 4.0 03/19/2020 1124   AST 47 (H) 03/19/2020 1124   ALT 28 03/19/2020 1124   ALKPHOS 67 03/19/2020 1124   BILITOT 0.8 03/19/2020 1124   GFRNONAA >60 03/20/2020 0313   GFRAA >60 03/20/2020 0313   Lipase     Component Value Date/Time   LIPASE 22 03/19/2020 1124       Studies/Results: No results found.  Anti-infectives: Anti-infectives (From admission, onward)   None       Assessment/Plan SBO Left lateral abdominal wall hernia  -SBO appears to resolved  radiographically and clinically -Patient is tolerating a full liquid diet. -Advance diet.  If tolerates soft diet and pain controlled with oral medications, can discharge this afternoon.  FEN - Soft VTE - SCDs, Lovenox ID - None   LOS: 3 days    Jacinto Halim , [facility] 03/22/2020, 9:52 AM Please see Amion for pager number during day hours 7:00am-4:30pm
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
## 2020-03-22 NOTE — Discharge Instructions (Signed)
Hernia, Adult     A hernia is the bulging of an organ or tissue through a weak spot in the muscles of the abdomen (abdominal wall). Hernias develop most often near the belly button (navel) or the area where the leg meets the lower abdomen (groin). Common types of hernias include:  Incisional hernia. This type bulges through a scar from an abdominal surgery.  Umbilical hernia. This type develops near the navel.  Inguinal hernia. This type develops in the groin or scrotum.  Femoral hernia. This type develops under the groin, in the upper thigh area.  Hiatal hernia. This type occurs when part of the stomach slides above the muscle that separates the abdomen from the chest (diaphragm). What are the causes? This condition may be caused by:  Heavy lifting.  Coughing over a long period of time.  Straining to have a bowel movement. Constipation can lead to straining.  An incision made during an abdominal surgery.  A physical problem that is present at birth (congenital defect).  Being overweight or obese.  Smoking.  Excess fluid in the abdomen.  Undescended testicles in males. What are the signs or symptoms? The main symptom is a skin-colored, rounded bulge in the area of the hernia. However, a bulge may not always be present. It may grow bigger or be more visible when you cough or strain (such as when lifting something heavy). A hernia that can be pushed back into the area (is reducible) rarely causes pain. A hernia that cannot be pushed back into the area (is incarcerated) may lose its blood supply (become strangulated). A hernia that is incarcerated may cause:  Pain.  Fever.  Nausea and vomiting.  Swelling.  Constipation. How is this diagnosed? A hernia may be diagnosed based on:  Your symptoms and medical history.  A physical exam. Your health care provider may ask you to cough or move in certain ways to see if the hernia becomes visible.  Imaging tests, such  as: ? X-rays. ? Ultrasound. ? CT scan. How is this treated? A hernia that is small and painless may not need to be treated. A hernia that is large or painful may be treated with surgery. Inguinal hernias may be treated with surgery to prevent incarceration or strangulation. Strangulated hernias are always treated with surgery because a lack of blood supply to the trapped organ or tissue can cause it to die. Surgery to treat a hernia involves pushing the bulge back into place and repairing the weak area of the muscle or abdominal wall. Follow these instructions at home: Activity  Avoid straining.  Do not lift anything that is heavier than 10 lb (4.5 kg), or the limit that you are told, until your health care provider says that it is safe.  When lifting heavy objects, lift with your leg muscles, not your back muscles. Preventing constipation  Take actions to prevent constipation. Constipation leads to straining with bowel movements, which can make a hernia worse or cause a hernia repair to break down. Your health care provider may recommend that you: ? Drink enough fluid to keep your urine pale yellow. ? Eat foods that are high in fiber, such as fresh fruits and vegetables, whole grains, and beans. ? Limit foods that are high in fat and processed sugars, such as fried or sweet foods. ? Take an over-the-counter or prescription medicine for constipation. General instructions  When coughing, try to cough gently.  You may try to push the hernia back in place   by very gently pressing on it while lying down. Do not try to force the bulge back in if it will not push in easily.  If you are overweight, work with your health care provider to lose weight safely.  Do not use any products that contain nicotine or tobacco, such as cigarettes and e-cigarettes. If you need help quitting, ask your health care provider.  If you are scheduled for hernia repair, watch your hernia for any changes in shape,  size, or color. Tell your health care provider about any changes or new symptoms.  Take over-the-counter and prescription medicines only as told by your health care provider.  Keep all follow-up visits as told by your health care provider. This is important. Contact a health care provider if:  You develop new pain, swelling, or redness around your hernia.  You have signs of constipation, such as: ? Fewer bowel movements in a week than normal. ? Difficulty having a bowel movement. ? Stools that are dry, hard, or larger than normal. Get help right away if:  You have a fever.  You have abdomen pain that gets worse.  You feel nauseous or you vomit.  You cannot push the hernia back in place by very gently pressing on it while lying down. Do not try to force the bulge back in if it will not push in easily.  The hernia: ? Changes in shape, size, or color. ? Feels hard or tender. These symptoms may represent a serious problem that is an emergency. Do not wait to see if the symptoms will go away. Get medical help right away. Call your local emergency services (911 in the [place] Summary  A hernia is the bulging of an organ or tissue through a weak spot in the muscles of the abdomen (abdominal wall).  The main symptom is a skin-colored, rounded lump (bulge) in the hernia area. However, a bulge may not always be present. It may grow bigger or more visible when you cough or strain (such as when having a bowel movement).  A hernia that is small and painless may not need to be treated. A hernia that is large or painful may be treated with surgery.  Surgery to treat a hernia involves pushing the bulge back into place and repairing the weak part of the abdomen. This information is not intended to replace advice given to you by your health care provider. Make sure you discuss any questions you have with your health care provider. Document Revised: 01/29/2019 Document Reviewed: 07/10/2017 Elsevier  Patient Education  [place] Please follow this eating plan for the next 2 weeks A soft-food eating plan includes foods that are safe and easy to chew and swallow. Your health care provider or dietitian can help you find foods and flavors that fit into this plan. Follow this plan until your health care provider or dietitian says it is safe to start eating other foods and food textures. What are tips for following this plan? General guidelines   Take small bites of food, or cut food into pieces about  inch or smaller. Bite-sized pieces of food are easier to chew and swallow.  Eat moist foods. Avoid overly dry foods.  Avoid foods that: ? Are difficult to swallow, such as dry, chunky, crispy, or sticky foods. ? Are difficult to chew, such as hard, tough, or stringy foods. ? Contain nuts, seeds, or fruits.  Follow instructions from your dietitian about the types  of liquids that are safe for you to swallow. You may be allowed to have: ? Thick liquids only. This includes only liquids that are thicker than honey. ? Thin and thick liquids. This includes all beverages and foods that become liquid at room temperature.  To make thick liquids: ? Purchase a commercial liquid thickening powder. These are available at grocery stores and pharmacies. ? Mix the thickener into liquids according to instructions on the label. ? Purchase ready-made thickened liquids. ? Thicken soup by pureeing, straining to remove chunks, and adding flour, potato flakes, or corn starch. ? Add commercial thickener to foods that become liquid at room temperature, such as milk shakes, yogurt, ice cream, gelatin, and sherbet.  Ask your health care provider whether you need to take a fiber supplement. Cooking  Cook meats so they stay tender and moist. Use methods like braising, stewing, or baking in liquid.  Cook vegetables and fruit until they are soft enough to be mashed with a  fork.  Peel soft, fresh fruits such as peaches, nectarines, and melons.  When making soup, make sure chunks of meat and vegetables are smaller than  inch.  Reheat leftover foods slowly so that a tough crust does not form. What foods are allowed? The items listed below may not be a complete list. Talk with your dietitian about what dietary choices are best for you. Grains Breads, muffins, pancakes, or waffles moistened with syrup, jelly, or butter. Dry cereals well-moistened with milk. Moist, cooked cereals. Well-cooked pasta and rice. Vegetables All soft-cooked vegetables. Shredded lettuce. Fruits All canned and cooked fruits. Soft, peeled fresh fruits. Strawberries. Dairy Milk. Cream. Yogurt. Cottage cheese. Soft cheese without the rind. Meats and other protein foods Tender, moist ground meat, poultry, or fish. Meat cooked in gravy or sauces. Eggs. Sweets and desserts Ice cream. Milk shakes. Sherbet. Pudding. Fats and oils Butter. Margarine. Olive, canola, sunflower, and grapeseed oil. Smooth salad dressing. Smooth cream cheese. Mayonnaise. Gravy. What foods are not allowed? The items listed bemay not be a complete list. Talk with your dietitian about what dietary choices are best for you. Grains Coarse or dry cereals, such as bran, granola, and shredded wheat. Tough or chewy crusty breads, such as [place] bread or baguettes. Breads with nuts, seeds, or fruit. Vegetables All raw vegetables. Cooked corn. Cooked vegetables that are tough or stringy. Tough, crisp, fried potatoes and potato skins. Fruits Fresh fruits with skins or seeds, or both, such as apples, pears, and grapes. Stringy, high-pulp fruits, such as papaya, pineapple, coconut, and mango. Fruit leather and all dried fruit. Dairy Yogurt with nuts or coconut. Meats and other protein foods Hard, dry sausages. Dry meat, poultry, or fish. Meats with gristle. Fish with bones. Fried meat or fish. Lunch meat and hotdogs. Nuts  and seeds. Chunky peanut butter or other nut butters. Sweets and desserts Cakes or cookies that are very dry or chewy. Desserts with dried fruit, nuts, or coconut. Fried pastries. Very rich pastries. Fats and oils Cream cheese with fruit or nuts. Salad dressings with seeds or chunks. Summary  A soft-food eating plan includes foods that are safe and easy to swallow. Generally, the foods should be soft enough to be mashed with a fork.  Avoid foods that are dry, hard to chew, crunchy, sticky, stringy, or crispy.  Ask your health care provider whether you need to thicken your liquids and if you need to take a fiber supplement. This information is not intended to replace advice given to you  by your health care provider. Make sure you discuss any questions you have with your health care provider. Document Revised: 01/29/2019 Document Reviewed: 12/11/2016 Elsevier Patient Education  2020 Elsevier Inc.   High-Fiber Diet He can begin following his diet starting 04/05/2020 Fiber, also called dietary fiber, is a type of carbohydrate that is found in fruits, vegetables, whole grains, and beans. A high-fiber diet can have many health benefits. Your health care provider may recommend a high-fiber diet to help:  Prevent constipation. Fiber can make your bowel movements more regular.  Lower your cholesterol.  Relieve the following conditions: ? Swelling of veins in the anus (hemorrhoids). ? Swelling and irritation (inflammation) of specific areas of the digestive tract (uncomplicated diverticulosis). ? A problem of the large intestine (colon) that sometimes causes pain and diarrhea (irritable bowel syndrome, IBS).  Prevent overeating as part of a weight-loss plan.  Prevent heart disease, type 2 diabetes, and certain cancers. What is my plan? The recommended daily fiber intake in grams (g) includes:  38 g for men age 15 or younger.  30 g for men over age 79.  25 g for women age 81 or  younger.  21 g for women over age 37. You can get the recommended daily intake of dietary fiber by:  Eating a variety of fruits, vegetables, grains, and beans.  Taking a fiber supplement, if it is not possible to get enough fiber through your diet. What do I need to know about a high-fiber diet?  It is better to get fiber through food sources rather than from fiber supplements. There is not a lot of research about how effective supplements are.  Always check the fiber content on the nutrition facts label of any prepackaged food. Look for foods that contain 5 g of fiber or more per serving.  Talk with a diet and nutrition specialist (dietitian) if you have questions about specific foods that are recommended or not recommended for your medical condition, especially if those foods are not listed below.  Gradually increase how much fiber you consume. If you increase your intake of dietary fiber too quickly, you may have bloating, cramping, or gas.  Drink plenty of water. Water helps you to digest fiber. What are tips for following this plan?  Eat a wide variety of high-fiber foods.  Make sure that half of the grains that you eat each day are whole grains.  Eat breads and cereals that are made with whole-grain flour instead of refined flour or white flour.  Eat brown rice, bulgur wheat, or millet instead of white rice.  Start the day with a breakfast that is high in fiber, such as a cereal that contains 5 g of fiber or more per serving.  Use beans in place of meat in soups, salads, and pasta dishes.  Eat high-fiber snacks, such as berries, raw vegetables, nuts, and popcorn.  Choose whole fruits and vegetables instead of processed forms like juice or sauce. What foods can I eat?  Fruits Berries. Pears. Apples. Oranges. Avocado. Prunes and raisins. Dried figs. Vegetables Sweet potatoes. Spinach. Kale. Artichokes. Cabbage. Broccoli. Cauliflower. Green peas. Carrots.  Squash. Grains Whole-grain breads. Multigrain cereal. Oats and oatmeal. Brown rice. Barley. Bulgur wheat. Millet. Quinoa. Bran muffins. Popcorn. Rye wafer crackers. Meats and other proteins Navy, kidney, and pinto beans. Soybeans. Split peas. Lentils. Nuts and seeds. Dairy Fiber-fortified yogurt. Beverages Fiber-fortified soy milk. Fiber-fortified orange juice. Other foods Fiber bars. The items listed above may not be a complete list  of recommended foods and beverages. Contact a dietitian for more options. What foods are not recommended? Fruits Fruit juice. Cooked, strained fruit. Vegetables Fried potatoes. Canned vegetables. Well-cooked vegetables. Grains White bread. Pasta made with refined flour. White rice. Meats and other proteins Fatty cuts of meat. Fried chicken or fried fish. Dairy Milk. Yogurt. Cream cheese. Sour cream. Fats and oils Butters. Beverages Soft drinks. Other foods Cakes and pastries. The items listed above may not be a complete list of foods and beverages to avoid. Contact a dietitian for more information. Summary  Fiber is a type of carbohydrate. It is found in fruits, vegetables, whole grains, and beans.  There are many health benefits of eating a high-fiber diet, such as preventing constipation, lowering blood cholesterol, helping with weight loss, and reducing your risk of heart disease, diabetes, and certain cancers.  Gradually increase your intake of fiber. Increasing too fast can result in cramping, bloating, and gas. Drink plenty of water while you increase your fiber.  The best sources of fiber include whole fruits and vegetables, whole grains, nuts, seeds, and beans. This information is not intended to replace advice given to you by your health care provider. Make sure you discuss any questions you have with your health care provider. Document Revised: 08/12/2017 Document Reviewed: 08/12/2017 Elsevier Patient Education  2020 [employer].  Please do not drive or operate heavy machinery while taking oxycodone.

## 2020-03-30 ENCOUNTER — Other Ambulatory Visit: Payer: Self-pay

## 2020-03-30 ENCOUNTER — Inpatient Hospital Stay (HOSPITAL_COMMUNITY)
Admission: EM | Admit: 2020-03-30 | Discharge: 2020-04-02 | DRG: 390 | Disposition: A | Discharge status: Home / Self Care | Payer: Self-pay | Attending: General Surgery | Admitting: General Surgery

## 2020-03-30 ENCOUNTER — Emergency Department (HOSPITAL_COMMUNITY): Payer: Self-pay

## 2020-03-30 DIAGNOSIS — Z8249 Family history of ischemic heart disease and other diseases of the circulatory system: Secondary | ICD-10-CM

## 2020-03-30 DIAGNOSIS — M549 Dorsalgia, unspecified: Secondary | ICD-10-CM

## 2020-03-30 DIAGNOSIS — Z933 Colostomy status: Secondary | ICD-10-CM

## 2020-03-30 DIAGNOSIS — K56600 Partial intestinal obstruction, unspecified as to cause: Principal | ICD-10-CM | POA: Diagnosis present

## 2020-03-30 DIAGNOSIS — Z833 Family history of diabetes mellitus: Secondary | ICD-10-CM

## 2020-03-30 DIAGNOSIS — Z905 Acquired absence of kidney: Secondary | ICD-10-CM

## 2020-03-30 DIAGNOSIS — Z20822 Contact with and (suspected) exposure to covid-19: Secondary | ICD-10-CM | POA: Diagnosis present

## 2020-03-30 DIAGNOSIS — K56609 Unspecified intestinal obstruction, unspecified as to partial versus complete obstruction: Secondary | ICD-10-CM | POA: Diagnosis present

## 2020-03-30 DIAGNOSIS — Z880 Allergy status to penicillin: Secondary | ICD-10-CM

## 2020-03-30 DIAGNOSIS — Z886 Allergy status to analgesic agent status: Secondary | ICD-10-CM

## 2020-03-30 DIAGNOSIS — F17200 Nicotine dependence, unspecified, uncomplicated: Secondary | ICD-10-CM | POA: Diagnosis present

## 2020-03-30 DIAGNOSIS — J449 Chronic obstructive pulmonary disease, unspecified: Secondary | ICD-10-CM | POA: Diagnosis present

## 2020-03-30 LAB — URINALYSIS, ROUTINE W REFLEX MICROSCOPIC
Bilirubin Urine: NEGATIVE
Glucose, UA: NEGATIVE mg/dL
Hgb urine dipstick: NEGATIVE
Ketones, ur: NEGATIVE mg/dL
Leukocytes,Ua: NEGATIVE
Nitrite: NEGATIVE
Protein, ur: NEGATIVE mg/dL
Specific Gravity, Urine: 1.026 (ref 1.005–1.030)
pH: 6 (ref 5.0–8.0)

## 2020-03-30 LAB — COMPREHENSIVE METABOLIC PANEL
ALT: 25 U/L (ref 0–44)
AST: 38 U/L (ref 15–41)
Albumin: 3.6 g/dL (ref 3.5–5.0)
Alkaline Phosphatase: 68 U/L (ref 38–126)
Anion gap: 10 (ref 5–15)
BUN: 12 mg/dL (ref 6–20)
CO2: 25 mmol/L (ref 22–32)
Calcium: 9 mg/dL (ref 8.9–10.3)
Chloride: 106 mmol/L (ref 98–111)
Creatinine, Ser: 1.23 mg/dL (ref 0.61–1.24)
GFR calc Af Amer: >60 mL/min (ref >60)
GFR calc non Af Amer: >60 mL/min (ref >60)
Glucose, Bld: 107 mg/dL — ABNORMAL HIGH (ref 70–99)
Potassium: 3.6 mmol/L (ref 3.5–5.1)
Sodium: 141 mmol/L (ref 135–145)
Total Bilirubin: 0.6 mg/dL (ref 0.3–1.2)
Total Protein: 6.3 g/dL — ABNORMAL LOW (ref 6.5–8.1)

## 2020-03-30 LAB — CBC
HCT: 41.3 % (ref 39.0–52.0)
Hemoglobin: 13.7 g/dL (ref 13.0–17.0)
MCH: 35.1 pg — ABNORMAL HIGH (ref 26.0–34.0)
MCHC: 33.2 g/dL (ref 30.0–36.0)
MCV: 105.9 fL — ABNORMAL HIGH (ref 80.0–100.0)
Platelets: 211 10*3/uL (ref 150–400)
RBC: 3.9 MIL/uL — ABNORMAL LOW (ref 4.22–5.81)
RDW: 13.8 % (ref 11.5–15.5)
WBC: 3.7 10*3/uL — ABNORMAL LOW (ref 4.0–10.5)
nRBC: 0 % (ref 0.0–0.2)

## 2020-03-30 LAB — LIPASE, BLOOD: Lipase: 37 U/L (ref 11–51)

## 2020-03-30 MED ORDER — SODIUM CHLORIDE 0.9 % IV BOLUS
1000.0000 mL | Freq: Once | INTRAVENOUS | Status: AC
  Administered 2020-03-30: 1000 mL via INTRAVENOUS

## 2020-03-30 MED ORDER — MORPHINE SULFATE (PF) 4 MG/ML IV SOLN
4.0000 mg | Freq: Once | INTRAVENOUS | Status: AC
  Administered 2020-03-30: 4 mg via INTRAVENOUS
  Filled 2020-03-30: qty 1

## 2020-03-30 MED ORDER — FENTANYL CITRATE (PF) 100 MCG/2ML IJ SOLN
50.0000 ug | Freq: Once | INTRAMUSCULAR | Status: AC
  Administered 2020-03-31: 50 ug via INTRAVENOUS
  Filled 2020-03-30: qty 2

## 2020-03-30 MED ORDER — IOHEXOL 300 MG/ML  SOLN
100.0000 mL | Freq: Once | INTRAMUSCULAR | Status: AC | PRN
  Administered 2020-03-30: 100 mL via INTRAVENOUS

## 2020-03-30 MED ORDER — DIATRIZOATE MEGLUMINE & SODIUM 66-10 % PO SOLN
90.0000 mL | Freq: Once | ORAL | Status: AC
  Administered 2020-03-31: 90 mL via ORAL
  Filled 2020-03-30: qty 90
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
## 2020-03-30 NOTE — ED Triage Notes (Signed)
Pt here for generalized abdominal pain onset noon today. Denies n/v/d. Recent admission for bowel obstruction and sts this feels the same. Last BM yesterday.
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
## 2020-03-31 ENCOUNTER — Encounter (HOSPITAL_COMMUNITY): Payer: Self-pay

## 2020-03-31 ENCOUNTER — Inpatient Hospital Stay (HOSPITAL_COMMUNITY): Payer: Self-pay

## 2020-03-31 LAB — SARS CORONAVIRUS 2 BY RT PCR (HOSPITAL ORDER, PERFORMED IN ~~LOC~~ HOSPITAL LAB): SARS Coronavirus 2: NEGATIVE

## 2020-03-31 MED ORDER — DIPHENHYDRAMINE HCL 12.5 MG/5ML PO ELIX: 12.5000 mg | ORAL_SOLUTION | Freq: Four times a day (QID) | ORAL | Status: DC | PRN

## 2020-03-31 MED ORDER — HYDROMORPHONE HCL 1 MG/ML IJ SOLN
0.5000 mg | INTRAMUSCULAR | Status: DC | PRN
  Administered 2020-03-31 – 2020-04-02 (×8): 0.5 mg via INTRAVENOUS
  Filled 2020-03-31 (×8): qty 1

## 2020-03-31 MED ORDER — DIPHENHYDRAMINE HCL 50 MG/ML IJ SOLN: 12.5000 mg | Freq: Four times a day (QID) | INTRAMUSCULAR | Status: DC | PRN

## 2020-03-31 MED ORDER — HEPARIN SODIUM (PORCINE) 5000 UNIT/ML IJ SOLN
5000.0000 [IU] | Freq: Three times a day (TID) | INTRAMUSCULAR | Status: DC
  Administered 2020-03-31 – 2020-04-01 (×6): 5000 [IU] via SUBCUTANEOUS
  Filled 2020-03-31 (×6): qty 1

## 2020-03-31 MED ORDER — OXYCODONE HCL 5 MG PO TABS
5.0000 mg | ORAL_TABLET | Freq: Four times a day (QID) | ORAL | Status: DC | PRN
  Administered 2020-03-31 – 2020-04-01 (×5): 5 mg via ORAL
  Filled 2020-03-31 (×5): qty 1

## 2020-03-31 MED ORDER — LACTATED RINGERS IV SOLN: INTRAVENOUS | Status: DC

## 2020-03-31 MED ORDER — ACETAMINOPHEN 325 MG PO TABS: 650.0000 mg | ORAL_TABLET | Freq: Four times a day (QID) | ORAL | Status: DC | PRN

## 2020-03-31 MED ORDER — ONDANSETRON 4 MG PO TBDP: 4.0000 mg | ORAL_TABLET | Freq: Four times a day (QID) | ORAL | Status: DC | PRN

## 2020-03-31 MED ORDER — TRIAMCINOLONE ACETONIDE 55 MCG/ACT NA AERO: 2.0000 | INHALATION_SPRAY | Freq: Every day | NASAL | Status: DC | PRN

## 2020-03-31 MED ORDER — ALBUTEROL SULFATE HFA 108 (90 BASE) MCG/ACT IN AERS
2.0000 | INHALATION_SPRAY | Freq: Four times a day (QID) | RESPIRATORY_TRACT | Status: DC | PRN
  Filled 2020-03-31: qty 6.7

## 2020-03-31 MED ORDER — ALBUTEROL SULFATE (2.5 MG/3ML) 0.083% IN NEBU: 2.5000 mg | INHALATION_SOLUTION | Freq: Four times a day (QID) | RESPIRATORY_TRACT | Status: DC | PRN

## 2020-03-31 NOTE — TOC Initial Note (Addendum)
Transition of Care [facility]) - Initial/Assessment Note    Patient Details  Name: David Benson MRN: [id number] Date of Birth: 11-Nov-1965  Transition of Care [facility]) CM/SW Contact:    Marilu Favre, RN Phone Number: 03/31/2020, 10:45 AM  Clinical Narrative:                 Patient from home with wife.    Discussed TOC pharmacy and [place] program for DC prescriptions. PAtient can afford $3 co pay . Patient reinstated in [facility] .    Patient interested in [facility]ness, scheduled appointment for April 21, 2020 at 2:10 pm. Information placed on AVS   Expected Discharge Plan: Home/Self Care     Patient Goals and CMS Choice Patient states their goals for this hospitalization and ongoing recovery are:: to return to home CMS Medicare.gov Compare Post Acute Care list provided to:: Patient Choice offered to / list presented to : NA  Expected Discharge Plan and Services Expected Discharge Plan: Home/Self Care In-house Referral: Financial Counselor Discharge Planning Services: CM Consult, [facility] [place], Medication Assistance   Living arrangements for the past 2 months: Single Family Home                 DME Arranged: N/A         HH Arranged: NA          Prior Living Arrangements/Services Living arrangements for the past 2 months: Single Family Home Lives with:: Spouse Patient language and need for interpreter reviewed:: Yes Do you feel safe going back to the place where you live?: Yes      Need for Family Participation in Patient Care: Yes (Comment) Care giver support system in place?: Yes (comment)   Criminal Activity/Legal Involvement Pertinent to Current Situation/Hospitalization: No - Comment as needed  Activities of Daily Living Home Assistive Devices/Equipment: None ADL Screening (condition at time of admission) Patient's cognitive ability adequate to safely complete daily activities?: Yes Is the patient deaf or have  difficulty hearing?: No Does the patient have difficulty seeing, even when wearing glasses/contacts?: No Does the patient have difficulty concentrating, remembering, or making decisions?: No Patient able to express need for assistance with ADLs?: Yes Does the patient have difficulty dressing or bathing?: No Independently performs ADLs?: Yes (appropriate for developmental age) Does the patient have difficulty walking or climbing stairs?: No Weakness of Legs: None Weakness of Arms/Hands: None  Permission Sought/Granted   Permission granted to share information with : No              Emotional Assessment   Attitude/Demeanor/Rapport: Engaged Affect (typically observed): Accepting Orientation: : Oriented to Self, Oriented to Place, Oriented to  Time, Oriented to Situation Alcohol / Substance Use: Not Applicable Psych Involvement: No (comment)  Admission diagnosis:  Small bowel obstruction (HCC) [K56.609] SBO (small bowel obstruction) ([place]) [K56.609] Patient Active Problem List   Diagnosis Date Noted  . Small bowel obstruction ([place]) 03/19/2020  . Aspiration pneumonia ([place]) 02/20/2019  . Lactic acid acidosis 02/20/2019  . Sepsis, Gram negative ([place]) 02/20/2019  . Acute renal failure (ARF) ([place]) 02/20/2019  . Alcoholic hepatitis without ascites 02/20/2019  . Post-ictal state ([place]) 10/06/2018  . SBO (small bowel obstruction) ([place]) 08/10/2016  . Polysubstance abuse ([place]) 08/10/2016  . 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 Seizure disorder ([place]) 11/23/2015  . Poor dentition 11/23/2015  . Macrocytic anemia   . Dysphagia   . Dyshidrotic eczema 06/11/2012  . Tobacco abuse  06/10/2012  . Oral candidiasis 05/20/2012  . Tinea corporis 05/20/2012  . 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 Alcohol abuse 01/24/2010  . DEPRESSION 01/24/2010  . COPD (chronic obstructive pulmonary disease) (HCC) 01/24/2010  . GERD 01/24/2010  . OSTEOARTHRITIS 01/24/2010  .  Personal history of other disorders of nervous system and sense organs 01/24/2010   PCP:  Patient, No Pcp Per Pharmacy:   [facility] [address] ([address]), [place] - [address] [place] ([place]) [place] [address] Phone: [phone] Fax: [phone]  [facility] [place], [place] - [address] [place] [address] Phone: [phone] Fax: [phone]     Social Determinants of Health (SDOH) Interventions    Readmission Risk Interventions Readmission Risk Prevention Plan 02/24/2019  Transportation Screening Complete  PCP or Specialist Appt within 3-5 Days Complete  HRI or Home Care Consult Complete  Social Work Consult for Recovery Care Planning/Counseling Complete  Palliative Care Screening Not Applicable  Medication Review Oceanographer) Complete  Some recent data might be hidden

## 2020-03-31 NOTE — Plan of Care (Signed)
  Problem: Education: Goal: Knowledge of General Education information will improve Description Including pain rating scale, medication(s)/side effects and non-pharmacologic comfort measures Outcome: Progressing   
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
## 2020-04-01 ENCOUNTER — Inpatient Hospital Stay (HOSPITAL_COMMUNITY): Payer: Self-pay

## 2020-04-01 MED ORDER — OXYCODONE HCL 5 MG PO TABS
5.0000 mg | ORAL_TABLET | Freq: Four times a day (QID) | ORAL | Status: DC | PRN
  Administered 2020-04-01 – 2020-04-02 (×3): 10 mg via ORAL
  Filled 2020-04-01 (×3): qty 2

## 2020-04-01 MED ORDER — LIDOCAINE 5 % EX PTCH
1.0000 | MEDICATED_PATCH | CUTANEOUS | Status: DC
  Administered 2020-04-01: 1 via TRANSDERMAL
  Filled 2020-04-01: qty 1

## 2020-04-01 MED ORDER — METHOCARBAMOL 1000 MG/10ML IJ SOLN
1000.0000 mg | Freq: Three times a day (TID) | INTRAVENOUS | Status: DC | PRN
  Administered 2020-04-01 (×2): 1000 mg via INTRAVENOUS
  Filled 2020-04-01 (×3): qty 10

## 2020-04-01 MED ORDER — HYDRALAZINE HCL 20 MG/ML IJ SOLN: 10.0000 mg | Freq: Three times a day (TID) | INTRAMUSCULAR | Status: DC | PRN

## 2020-04-01 NOTE — Plan of Care (Signed)
  Problem: Activity: Goal: Risk for activity intolerance will decrease Outcome: Progressing   Problem: Nutrition: Goal: Adequate nutrition will be maintained Outcome: Progressing   Problem: Elimination: Goal: Will not experience complications related to bowel motility Outcome: Progressing   
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
## 2020-04-02 MED ORDER — METHOCARBAMOL 500 MG PO TABS
500.0000 mg | ORAL_TABLET | Freq: Four times a day (QID) | ORAL | 0 refills | Status: DC | PRN
Start: 2020-04-02 — End: 2020-07-07

## 2020-04-02 MED ORDER — ACETAMINOPHEN 325 MG PO TABS: 650.0000 mg | ORAL_TABLET | Freq: Four times a day (QID) | ORAL | Status: DC | PRN

## 2020-04-02 MED ORDER — OXYCODONE HCL 5 MG PO TABS: 5.0000 mg | ORAL_TABLET | Freq: Four times a day (QID) | ORAL | 0 refills | Status: DC | PRN
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
## 2020-04-02 NOTE — Discharge Instructions (Signed)
Bowel Obstruction A bowel obstruction means that something is blocking the small or large bowel. The bowel is also called the intestine. It is the long tube that connects the stomach to the opening of the butt (anus). When something blocks the bowel, food and fluids cannot pass through like normal. This condition needs to be treated. Treatment depends on the cause of the problem and how bad the problem is. What are the causes? Common causes of this condition include:  Scar tissue (adhesions) from past surgery or from high-energy X-rays (radiation).  Recent surgery in the belly. This affects how food moves in the bowel.  Some diseases, such as: ? Irritation of the lining of the digestive tract (Crohn's disease). ? Irritation of small pouches in the bowel (diverticulitis).  Growths or tumors.  A bulging organ (hernia).  Twisting of the bowel (volvulus).  A foreign body.  Slipping of a part of the bowel into another part (intussusception). What are the signs or symptoms? Symptoms of this condition include:  Pain in the belly.  Feeling sick to your stomach (nauseous).  Throwing up (vomiting).  Bloating in the belly.  Being unable to pass gas.  Trouble pooping (constipation).  Watery poop (diarrhea).  A lot of belching. How is this diagnosed? This condition may be diagnosed based on:  A physical exam.  Medical history.  Imaging tests, such as X-ray or CT scan.  Blood tests.  Urine tests. How is this treated? Treatment for this condition may include:  Fluids and pain medicines that are given through an IV tube. Your doctor may tell you not to eat or drink if you feel sick to your stomach and are throwing up.  Eating a clear liquid diet for a few days.  Putting a small tube (nasogastric tube) into the stomach. This will help with pain, discomfort, and nausea by removing blocked air and fluids from the stomach.  Surgery. This may be needed if other treatments do  not work. Follow these instructions at home: Medicines  Take over-the-counter and prescription medicines only as told by your doctor.  If you were prescribed an antibiotic medicine, take it as told by your doctor. Do not stop taking the antibiotic even if you start to feel better. General instructions  Follow your diet as told by your doctor. You may need to: ? Only drink clear liquids until you start to get better. ? Avoid solid foods.  Return to your normal activities as told by your doctor. Ask your doctor what activities are safe for you.  Do not sit for a long time without moving. Get up to take short walks every 1-2 hours. This is important. Ask for help if you feel weak or unsteady.  Keep all follow-up visits as told by your doctor. This is important. How is this prevented? After having a bowel obstruction, you may be more likely to have another. You can do some things to stop it from happening again.  If you have a long-term (chronic) disease, contact your doctor if you see changes or problems.  Take steps to prevent or treat trouble pooping. Your doctor may ask that you: ? Drink enough fluid to keep your pee (urine) pale yellow. ? Take over-the-counter or prescription medicines. ? Eat foods that are high in fiber. These include beans, whole grains, and fresh fruits and vegetables. ? Limit foods that are high in fat and sugar. These include fried or sweet foods.  Stay active. Ask your doctor which exercises are  safe for you.  Avoid stress.  Eat three small meals and three small snacks each day.  Work with a Psychologist, prison and probation services (dietitian) to make a meal plan that works for you.  Do not use any products that contain nicotine or tobacco, such as cigarettes and e-cigarettes. If you need help quitting, ask your doctor. Contact a doctor if:  You have a fever.  You have chills. Get help right away if:  You have pain or cramps that get worse.  You throw up blood.  You are  sick to your stomach.  You cannot stop throwing up.  You cannot drink fluids.  You feel mixed up (confused).  You feel very thirsty (dehydrated).  Your belly gets more bloated.  You feel weak or you pass out (faint). Summary  A bowel obstruction means that something is blocking the small or large bowel.  Treatment may include IV fluids and pain medicine. You may also have a clear liquid diet, a small tube in your stomach, or surgery.  Drink clear liquids and avoid solid foods until you get better. This information is not intended to replace advice given to you by your health care provider. Make sure you discuss any questions you have with your health care provider. Document Revised: 02/19/2018 Document Reviewed: 02/19/2018 Elsevier Patient Education  2020 [employer]   Soft-Food Eating Plan Please follow this eating plan for the next 4 weeks. A soft-food eating plan includes foods that are safe and easy to chew and swallow. Your health care provider or dietitian can help you find foods and flavors that fit into this plan. Follow this plan until your health care provider or dietitian says it is safe to start eating other foods and food textures. What are tips for following this plan? General guidelines   Take small bites of food, or cut food into pieces about  inch or smaller. Bite-sized pieces of food are easier to chew and swallow.  Eat moist foods. Avoid overly dry foods.  Avoid foods that: ? Are difficult to swallow, such as dry, chunky, crispy, or sticky foods. ? Are difficult to chew, such as hard, tough, or stringy foods. ? Contain nuts, seeds, or fruits.  Follow instructions from your dietitian about the types of liquids that are safe for you to swallow. You may be allowed to have: ? Thick liquids only. This includes only liquids that are thicker than honey. ? Thin and thick liquids. This includes all beverages and foods that become liquid at room  temperature.  To make thick liquids: ? Purchase a commercial liquid thickening powder. These are available at grocery stores and pharmacies. ? Mix the thickener into liquids according to instructions on the label. ? Purchase ready-made thickened liquids. ? Thicken soup by pureeing, straining to remove chunks, and adding flour, potato flakes, or corn starch. ? Add commercial thickener to foods that become liquid at room temperature, such as milk shakes, yogurt, ice cream, gelatin, and sherbet.  Ask your health care provider whether you need to take a fiber supplement. Cooking  Cook meats so they stay tender and moist. Use methods like braising, stewing, or baking in liquid.  Cook vegetables and fruit until they are soft enough to be mashed with a fork.  Peel soft, fresh fruits such as peaches, nectarines, and melons.  When making soup, make sure chunks of meat and vegetables are smaller than  inch.  Reheat leftover foods slowly so that a tough crust does not form. What foods  are allowed? The items listed below may not be a complete list. Talk with your dietitian about what dietary choices are best for you. Grains Breads, muffins, pancakes, or waffles moistened with syrup, jelly, or butter. Dry cereals well-moistened with milk. Moist, cooked cereals. Well-cooked pasta and rice. Vegetables All soft-cooked vegetables. Shredded lettuce. Fruits All canned and cooked fruits. Soft, peeled fresh fruits. Strawberries. Dairy Milk. Cream. Yogurt. Cottage cheese. Soft cheese without the rind. Meats and other protein foods Tender, moist ground meat, poultry, or fish. Meat cooked in gravy or sauces. Eggs. Sweets and desserts Ice cream. Milk shakes. Sherbet. Pudding. Fats and oils Butter. Margarine. Olive, canola, sunflower, and grapeseed oil. Smooth salad dressing. Smooth cream cheese. Mayonnaise. Gravy. What foods are not allowed? The items listed bemay not be a complete list. Talk with your  dietitian about what dietary choices are best for you. Grains Coarse or dry cereals, such as bran, granola, and shredded wheat. Tough or chewy crusty breads, such as [place] bread or baguettes. Breads with nuts, seeds, or fruit. Vegetables All raw vegetables. Cooked corn. Cooked vegetables that are tough or stringy. Tough, crisp, fried potatoes and potato skins. Fruits Fresh fruits with skins or seeds, or both, such as apples, pears, and grapes. Stringy, high-pulp fruits, such as papaya, pineapple, coconut, and mango. Fruit leather and all dried fruit. Dairy Yogurt with nuts or coconut. Meats and other protein foods Hard, dry sausages. Dry meat, poultry, or fish. Meats with gristle. Fish with bones. Fried meat or fish. Lunch meat and hotdogs. Nuts and seeds. Chunky peanut butter or other nut butters. Sweets and desserts Cakes or cookies that are very dry or chewy. Desserts with dried fruit, nuts, or coconut. Fried pastries. Very rich pastries. Fats and oils Cream cheese with fruit or nuts. Salad dressings with seeds or chunks. Summary  A soft-food eating plan includes foods that are safe and easy to swallow. Generally, the foods should be soft enough to be mashed with a fork.  Avoid foods that are dry, hard to chew, crunchy, sticky, stringy, or crispy.  Ask your health care provider whether you need to thicken your liquids and if you need to take a fiber supplement. This information is not intended to replace advice given to you by your health care provider. Make sure you discuss any questions you have with your health care provider. Document Revised: 01/29/2019 Document Reviewed: 12/11/2016 Elsevier Patient Education  [place].
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
## 2020-04-21 ENCOUNTER — Ambulatory Visit: Payer: Self-pay | Attending: Physician Assistant | Admitting: Physician Assistant

## 2020-04-21 ENCOUNTER — Encounter: Payer: Self-pay | Admitting: General Practice

## 2020-04-21 ENCOUNTER — Other Ambulatory Visit: Payer: Self-pay

## 2020-04-21 DIAGNOSIS — Z09 Encounter for follow-up examination after completed treatment for conditions other than malignant neoplasm: Secondary | ICD-10-CM

## 2020-04-21 DIAGNOSIS — K56609 Unspecified intestinal obstruction, unspecified as to partial versus complete obstruction: Secondary | ICD-10-CM

## 2020-04-21 DIAGNOSIS — J449 Chronic obstructive pulmonary disease, unspecified: Secondary | ICD-10-CM

## 2020-04-21 MED ORDER — ALBUTEROL SULFATE HFA 108 (90 BASE) MCG/ACT IN AERS: 2.0000 | INHALATION_SPRAY | Freq: Four times a day (QID) | RESPIRATORY_TRACT | 1 refills | Status: AC | PRN

## 2020-04-30 IMAGING — DX DG ABD PORTABLE 1V
1 series · 1 of 1 positions shown · non-contrast
Comparison: 12/09/2019 at [DATE] p.m.

CLINICAL DATA: Blockage.

EXAM:
PORTABLE ABDOMEN - 1 VIEW

[abdomen]
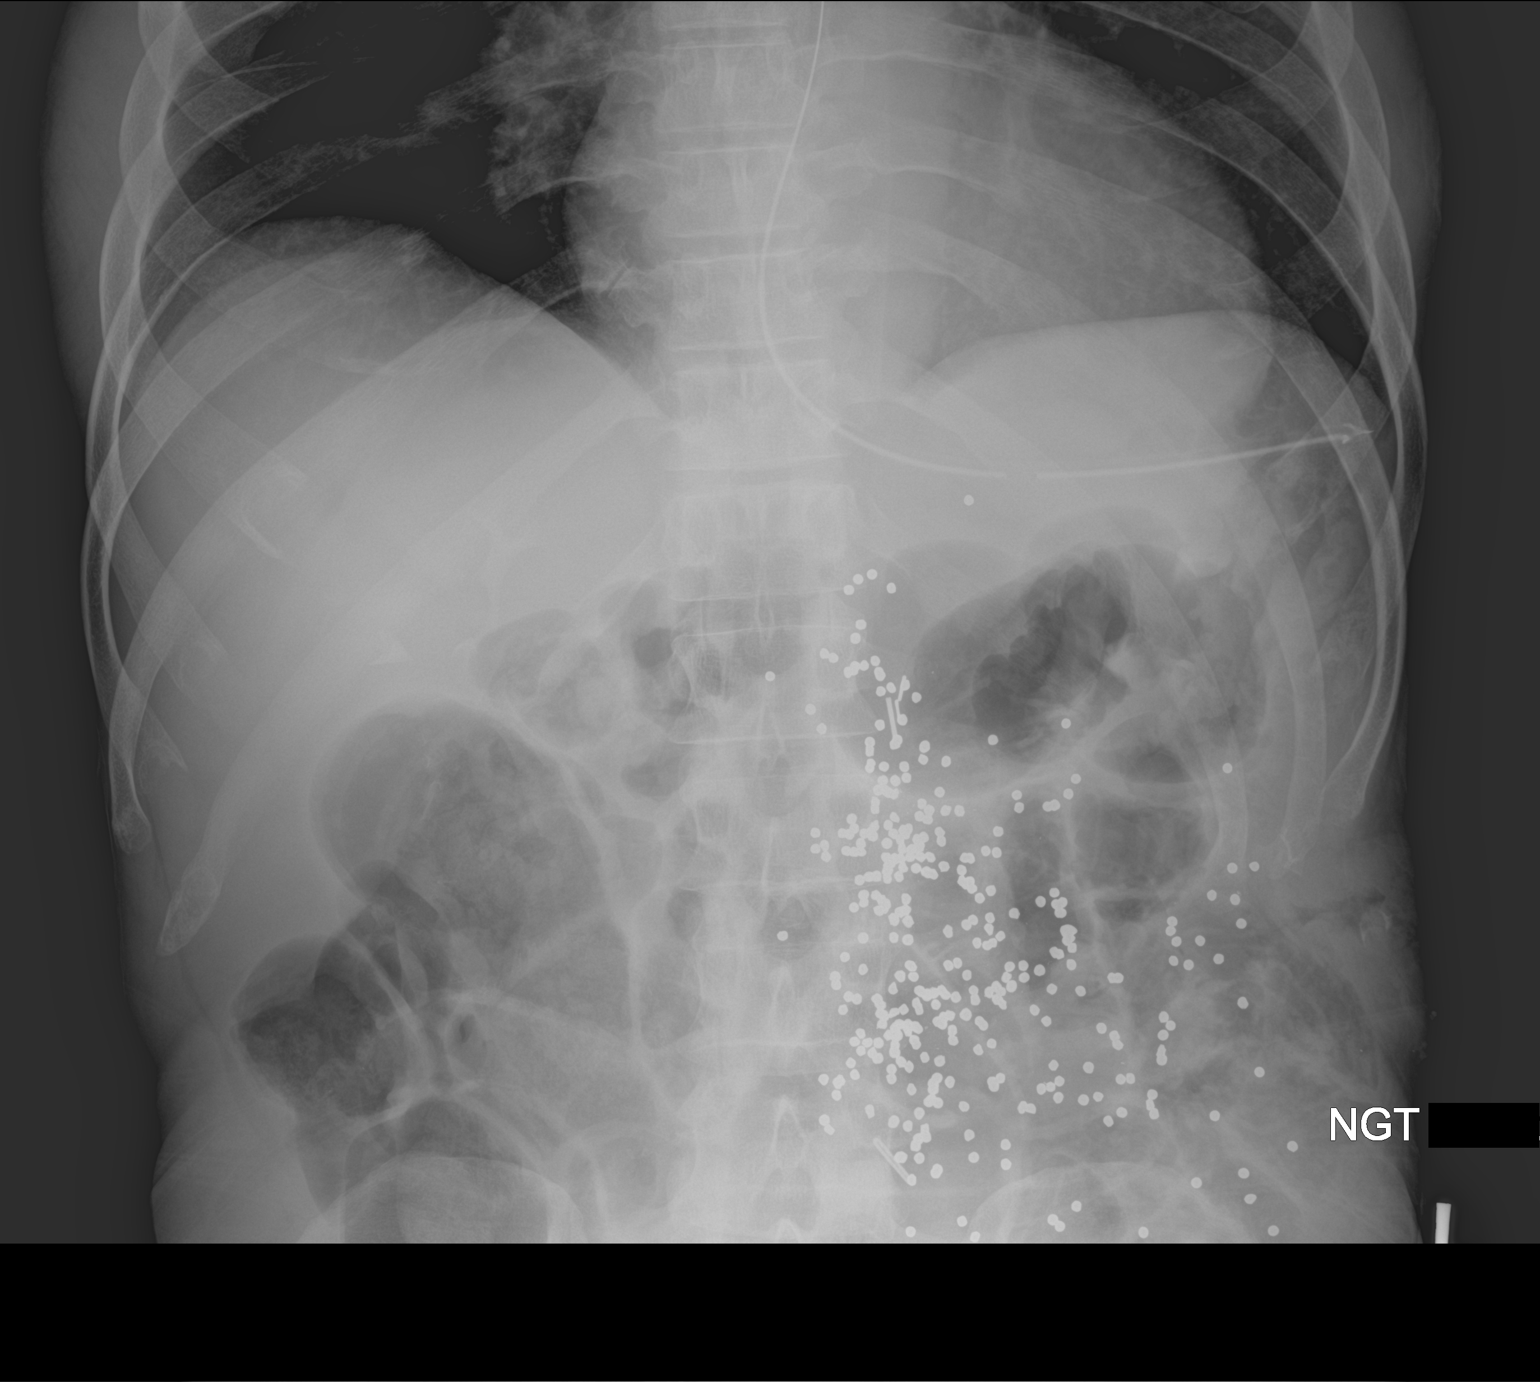

[1 of 1 positions shown; findings below may reference images not displayed]

FINDINGS: Again noted are dilated loops of small bowel scattered throughout
the abdomen. The enteric tube projects over the expected region of
the gastric body/fundus. Multiple metallic foreign bodies project
over the patient's left hemiabdomen.
IMPRESSION: Enteric tube projects over the gastric body/fundus.

## 2020-05-01 IMAGING — DX DG ABD PORTABLE 1V
1 series · 1 of 1 positions shown · non-contrast
Comparison: December 09, 2019

CLINICAL DATA: 8 hour delayed film.  Small bowel follow-through.

EXAM:
PORTABLE ABDOMEN - 1 VIEW

[abdomen kub]
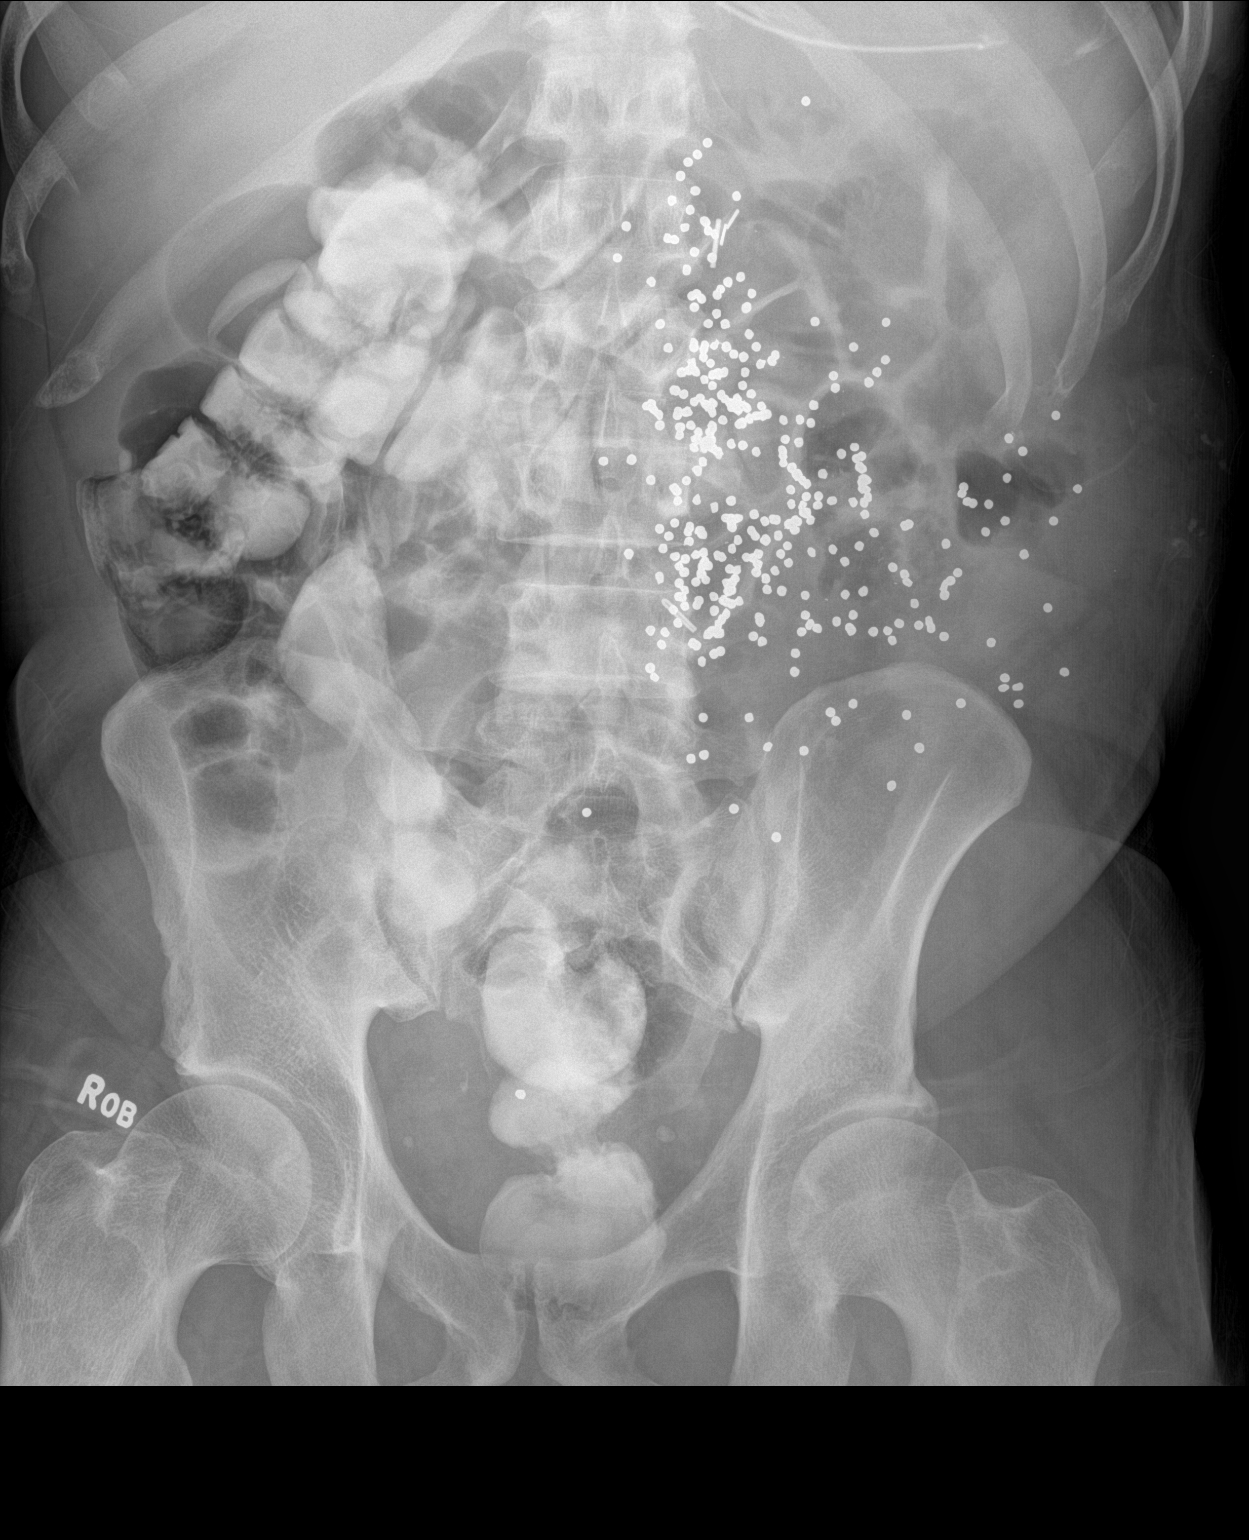

[1 of 1 positions shown; findings below may reference images not displayed]

FINDINGS: There is contrast in the colon to the level the rectum. All the
contrast filled colon is in the right side of the abdomen. [place]
is seen over the left side of the abdomen. No convincing evidence of
obstruction on this study.
IMPRESSION: Contrast has reached the rectum. No convincing evidence of
high-grade obstruction.

## 2020-06-05 ENCOUNTER — Emergency Department (HOSPITAL_COMMUNITY)
Admission: EM | Admit: 2020-06-05 | Discharge: 2020-06-05 | Disposition: A | Discharge status: Home / Self Care | Payer: Self-pay | Attending: Emergency Medicine | Admitting: Emergency Medicine

## 2020-06-05 ENCOUNTER — Encounter (HOSPITAL_COMMUNITY): Payer: Self-pay | Admitting: Emergency Medicine

## 2020-06-05 ENCOUNTER — Other Ambulatory Visit: Payer: Self-pay

## 2020-06-05 DIAGNOSIS — Z5321 Procedure and treatment not carried out due to patient leaving prior to being seen by health care provider: Secondary | ICD-10-CM | POA: Insufficient documentation

## 2020-06-05 DIAGNOSIS — R109 Unspecified abdominal pain: Secondary | ICD-10-CM | POA: Insufficient documentation

## 2020-06-05 LAB — COMPREHENSIVE METABOLIC PANEL
ALT: 302 U/L — ABNORMAL HIGH (ref 0–44)
AST: 523 U/L — ABNORMAL HIGH (ref 15–41)
Albumin: 3.7 g/dL (ref 3.5–5.0)
Alkaline Phosphatase: 83 U/L (ref 38–126)
Anion gap: 10 (ref 5–15)
BUN: 9 mg/dL (ref 6–20)
CO2: 28 mmol/L (ref 22–32)
Calcium: 9.1 mg/dL (ref 8.9–10.3)
Chloride: 100 mmol/L (ref 98–111)
Creatinine, Ser: 1.07 mg/dL (ref 0.61–1.24)
GFR calc Af Amer: >60 mL/min (ref >60)
GFR calc non Af Amer: >60 mL/min (ref >60)
Glucose, Bld: 116 mg/dL — ABNORMAL HIGH (ref 70–99)
Potassium: 4.5 mmol/L (ref 3.5–5.1)
Sodium: 138 mmol/L (ref 135–145)
Total Bilirubin: 0.4 mg/dL (ref 0.3–1.2)
Total Protein: 6.5 g/dL (ref 6.5–8.1)

## 2020-06-05 LAB — CBC
HCT: 42.9 % (ref 39.0–52.0)
Hemoglobin: 14.1 g/dL (ref 13.0–17.0)
MCH: 34.5 pg — ABNORMAL HIGH (ref 26.0–34.0)
MCHC: 32.9 g/dL (ref 30.0–36.0)
MCV: 104.9 fL — ABNORMAL HIGH (ref 80.0–100.0)
Platelets: 172 10*3/uL (ref 150–400)
RBC: 4.09 MIL/uL — ABNORMAL LOW (ref 4.22–5.81)
RDW: 13.7 % (ref 11.5–15.5)
WBC: 4.4 10*3/uL (ref 4.0–10.5)
nRBC: 0 % (ref 0.0–0.2)

## 2020-06-05 LAB — LIPASE, BLOOD: Lipase: 43 U/L (ref 11–51)

## 2020-06-05 NOTE — ED Notes (Signed)
Pt stated that he was leaving because he did not want to wait.

## 2020-06-05 NOTE — ED Triage Notes (Signed)
Pt. Stated, David Benson had 3 SBO in the last 6 months and Im having the same symptoms. Stomach pain, and unable to have a bowel movement in the last 2 days.

## 2020-06-11 ENCOUNTER — Ambulatory Visit (INDEPENDENT_AMBULATORY_CARE_PROVIDER_SITE_OTHER): Payer: HRSA Program

## 2020-06-11 ENCOUNTER — Encounter (HOSPITAL_COMMUNITY): Payer: Self-pay | Admitting: *Deleted

## 2020-06-11 ENCOUNTER — Ambulatory Visit (HOSPITAL_COMMUNITY)
Admission: EM | Admit: 2020-06-11 | Discharge: 2020-06-11 | Disposition: A | Discharge status: Home / Self Care | Payer: HRSA Program | Attending: Family Medicine | Admitting: Family Medicine

## 2020-06-11 ENCOUNTER — Other Ambulatory Visit: Payer: Self-pay

## 2020-06-11 DIAGNOSIS — F17210 Nicotine dependence, cigarettes, uncomplicated: Secondary | ICD-10-CM | POA: Diagnosis not present

## 2020-06-11 DIAGNOSIS — M546 Pain in thoracic spine: Secondary | ICD-10-CM | POA: Diagnosis not present

## 2020-06-11 DIAGNOSIS — U071 COVID-19: Secondary | ICD-10-CM | POA: Diagnosis not present

## 2020-06-11 DIAGNOSIS — J1282 Pneumonia due to coronavirus disease 2019: Secondary | ICD-10-CM | POA: Insufficient documentation

## 2020-06-11 DIAGNOSIS — Z1152 Encounter for screening for COVID-19: Secondary | ICD-10-CM | POA: Insufficient documentation

## 2020-06-11 DIAGNOSIS — R0602 Shortness of breath: Secondary | ICD-10-CM | POA: Diagnosis present

## 2020-06-11 DIAGNOSIS — R05 Cough: Secondary | ICD-10-CM

## 2020-06-11 DIAGNOSIS — J449 Chronic obstructive pulmonary disease, unspecified: Secondary | ICD-10-CM | POA: Insufficient documentation

## 2020-06-11 DIAGNOSIS — R0682 Tachypnea, not elsewhere classified: Secondary | ICD-10-CM | POA: Diagnosis not present

## 2020-06-11 DIAGNOSIS — J129 Viral pneumonia, unspecified: Secondary | ICD-10-CM

## 2020-06-11 DIAGNOSIS — Z79899 Other long term (current) drug therapy: Secondary | ICD-10-CM | POA: Insufficient documentation

## 2020-06-11 DIAGNOSIS — R0982 Postnasal drip: Secondary | ICD-10-CM

## 2020-06-11 DIAGNOSIS — Z905 Acquired absence of kidney: Secondary | ICD-10-CM | POA: Insufficient documentation

## 2020-06-11 DIAGNOSIS — R5383 Other fatigue: Secondary | ICD-10-CM | POA: Insufficient documentation

## 2020-06-11 DIAGNOSIS — R059 Cough, unspecified: Secondary | ICD-10-CM

## 2020-06-11 DIAGNOSIS — M1990 Unspecified osteoarthritis, unspecified site: Secondary | ICD-10-CM | POA: Diagnosis not present

## 2020-06-11 MED ORDER — DEXAMETHASONE SODIUM PHOSPHATE 10 MG/ML IJ SOLN
INTRAMUSCULAR | Status: AC
  Filled 2020-06-11: qty 1

## 2020-06-11 MED ORDER — BENZONATATE 100 MG PO CAPS
100.0000 mg | ORAL_CAPSULE | Freq: Three times a day (TID) | ORAL | 0 refills | Status: DC
Start: 2020-06-11 — End: 2020-07-07

## 2020-06-11 MED ORDER — AEROCHAMBER PLUS FLO-VU LARGE MISC
1.0000 | Freq: Once | Status: AC
  Administered 2020-06-11: 1

## 2020-06-11 MED ORDER — FLUTICASONE-SALMETEROL 250-50 MCG/DOSE IN AEPB
1.0000 | INHALATION_SPRAY | Freq: Two times a day (BID) | RESPIRATORY_TRACT | 2 refills | Status: DC
Start: 2020-06-11 — End: 2020-07-07

## 2020-06-11 MED ORDER — KETOROLAC TROMETHAMINE 60 MG/2ML IM SOLN
INTRAMUSCULAR | Status: AC
  Filled 2020-06-11: qty 2

## 2020-06-11 MED ORDER — PREDNISONE 10 MG (21) PO TBPK
ORAL_TABLET | Freq: Every day | ORAL | 0 refills | Status: DC
Start: 2020-06-11 — End: 2020-07-07

## 2020-06-11 MED ORDER — AEROCHAMBER PLUS FLO-VU LARGE MISC
Status: AC
  Filled 2020-06-11: qty 1

## 2020-06-11 MED ORDER — IPRATROPIUM-ALBUTEROL 0.5-2.5 (3) MG/3ML IN SOLN: 3.0000 mL | RESPIRATORY_TRACT | 0 refills | Status: DC | PRN

## 2020-06-11 MED ORDER — DEXAMETHASONE SODIUM PHOSPHATE 10 MG/ML IJ SOLN
10.0000 mg | Freq: Once | INTRAMUSCULAR | Status: AC
  Administered 2020-06-11: 10 mg via INTRAMUSCULAR

## 2020-06-11 MED ORDER — ALBUTEROL SULFATE HFA 108 (90 BASE) MCG/ACT IN AERS
2.0000 | INHALATION_SPRAY | Freq: Once | RESPIRATORY_TRACT | Status: AC
  Administered 2020-06-11: 2 via RESPIRATORY_TRACT

## 2020-06-11 MED ORDER — DOXYCYCLINE HYCLATE 100 MG PO CAPS
100.0000 mg | ORAL_CAPSULE | Freq: Two times a day (BID) | ORAL | 0 refills | Status: DC
Start: 2020-06-11 — End: 2020-07-07

## 2020-06-11 MED ORDER — KETOROLAC TROMETHAMINE 60 MG/2ML IM SOLN
60.0000 mg | Freq: Once | INTRAMUSCULAR | Status: AC
  Administered 2020-06-11: 60 mg via INTRAMUSCULAR

## 2020-06-11 MED ORDER — ALBUTEROL SULFATE HFA 108 (90 BASE) MCG/ACT IN AERS
INHALATION_SPRAY | RESPIRATORY_TRACT | Status: AC
  Filled 2020-06-11: qty 6.7

## 2020-06-11 NOTE — ED Triage Notes (Signed)
Patient reports cough, sob, and nasal congestion/drainage. Patient states that this started about a week ago after he took the COVID vaccine. Patient with severe cough in triage. Patient with hx of asthma--no nebs or inhalers at home.    Patient went to ED on the 15th but did not wait due to wait.

## 2020-06-11 NOTE — Discharge Instructions (Addendum)
Your COVID test is pending.  You should self quarantine until the test result is back.    Take Tylenol as needed for fever or discomfort.  Rest and keep yourself hydrated.    Go to the emergency department if you develop acute worsening symptoms.    I prescribe doxycycline for you to take twice a day for a week  I have sent in a steroid taper for you to take as well. Take 6 tablets for the first two days, take 5 tablets for day three and four, take 4 tablets for days five and six, take 3 tablets for days seven and eight, take 2 tablets for day nine and ten, then take 1 tablet for days eleven and twelve.  I have sent in an Advair inhaler for you to use 1 puff in the morning and 1 puff in the evening  I have sent in DuoNeb solution may use DuoNeb every 4-6 hours as needed for wheezing.  Do not use in conjunction with albuterol  Given your nebulizer machine here in the office  Given you albuterol here in the office as well, you may use this every 4 hours as needed for wheezing coughing.  Do not use in conjunction with the DuoNeb.
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
## 2020-06-12 LAB — SARS CORONAVIRUS 2 (TAT 6-24 HRS): SARS Coronavirus 2: POSITIVE — AB

## 2020-07-04 ENCOUNTER — Inpatient Hospital Stay (HOSPITAL_COMMUNITY): Payer: Self-pay

## 2020-07-04 ENCOUNTER — Encounter (HOSPITAL_COMMUNITY): Payer: Self-pay | Admitting: Emergency Medicine

## 2020-07-04 ENCOUNTER — Emergency Department (HOSPITAL_COMMUNITY): Payer: Self-pay

## 2020-07-04 ENCOUNTER — Other Ambulatory Visit: Payer: Self-pay

## 2020-07-04 ENCOUNTER — Inpatient Hospital Stay (HOSPITAL_COMMUNITY)
Admission: EM | Admit: 2020-07-04 | Discharge: 2020-07-07 | DRG: 390 | Disposition: A | Discharge status: Home / Self Care | Payer: Self-pay | Attending: Physician Assistant | Admitting: Physician Assistant

## 2020-07-04 DIAGNOSIS — Z9049 Acquired absence of other specified parts of digestive tract: Secondary | ICD-10-CM

## 2020-07-04 DIAGNOSIS — F17210 Nicotine dependence, cigarettes, uncomplicated: Secondary | ICD-10-CM | POA: Diagnosis present

## 2020-07-04 DIAGNOSIS — Z8701 Personal history of pneumonia (recurrent): Secondary | ICD-10-CM

## 2020-07-04 DIAGNOSIS — Z880 Allergy status to penicillin: Secondary | ICD-10-CM

## 2020-07-04 DIAGNOSIS — Z0189 Encounter for other specified special examinations: Secondary | ICD-10-CM

## 2020-07-04 DIAGNOSIS — Z8616 Personal history of COVID-19: Secondary | ICD-10-CM

## 2020-07-04 DIAGNOSIS — K56609 Unspecified intestinal obstruction, unspecified as to partial versus complete obstruction: Principal | ICD-10-CM | POA: Diagnosis present

## 2020-07-04 DIAGNOSIS — Z79899 Other long term (current) drug therapy: Secondary | ICD-10-CM

## 2020-07-04 DIAGNOSIS — Z91018 Allergy to other foods: Secondary | ICD-10-CM

## 2020-07-04 DIAGNOSIS — Z91048 Other nonmedicinal substance allergy status: Secondary | ICD-10-CM

## 2020-07-04 DIAGNOSIS — M1990 Unspecified osteoarthritis, unspecified site: Secondary | ICD-10-CM | POA: Diagnosis present

## 2020-07-04 DIAGNOSIS — Z888 Allergy status to other drugs, medicaments and biological substances status: Secondary | ICD-10-CM

## 2020-07-04 DIAGNOSIS — Z20822 Contact with and (suspected) exposure to covid-19: Secondary | ICD-10-CM | POA: Diagnosis present

## 2020-07-04 DIAGNOSIS — J449 Chronic obstructive pulmonary disease, unspecified: Secondary | ICD-10-CM | POA: Diagnosis present

## 2020-07-04 LAB — COMPREHENSIVE METABOLIC PANEL
ALT: 31 U/L (ref 0–44)
AST: 36 U/L (ref 15–41)
Albumin: 3.8 g/dL (ref 3.5–5.0)
Alkaline Phosphatase: 74 U/L (ref 38–126)
Anion gap: 13 (ref 5–15)
BUN: 11 mg/dL (ref 6–20)
CO2: 28 mmol/L (ref 22–32)
Calcium: 9.4 mg/dL (ref 8.9–10.3)
Chloride: 98 mmol/L (ref 98–111)
Creatinine, Ser: 1 mg/dL (ref 0.61–1.24)
GFR calc Af Amer: >60 mL/min (ref >60)
GFR calc non Af Amer: >60 mL/min (ref >60)
Glucose, Bld: 127 mg/dL — ABNORMAL HIGH (ref 70–99)
Potassium: 3.5 mmol/L (ref 3.5–5.1)
Sodium: 139 mmol/L (ref 135–145)
Total Bilirubin: 0.9 mg/dL (ref 0.3–1.2)
Total Protein: 6.9 g/dL (ref 6.5–8.1)

## 2020-07-04 LAB — CBC
HCT: 43.5 % (ref 39.0–52.0)
Hemoglobin: 14.4 g/dL (ref 13.0–17.0)
MCH: 34.3 pg — ABNORMAL HIGH (ref 26.0–34.0)
MCHC: 33.1 g/dL (ref 30.0–36.0)
MCV: 103.6 fL — ABNORMAL HIGH (ref 80.0–100.0)
Platelets: 180 10*3/uL (ref 150–400)
RBC: 4.2 MIL/uL — ABNORMAL LOW (ref 4.22–5.81)
RDW: 13.4 % (ref 11.5–15.5)
WBC: 6.3 10*3/uL (ref 4.0–10.5)
nRBC: 0 % (ref 0.0–0.2)

## 2020-07-04 LAB — SARS CORONAVIRUS 2 BY RT PCR (HOSPITAL ORDER, PERFORMED IN ~~LOC~~ HOSPITAL LAB): SARS Coronavirus 2: NEGATIVE

## 2020-07-04 MED ORDER — ACETAMINOPHEN 10 MG/ML IV SOLN
1000.0000 mg | Freq: Four times a day (QID) | INTRAVENOUS | Status: AC
  Administered 2020-07-04 – 2020-07-05 (×4): 1000 mg via INTRAVENOUS
  Filled 2020-07-04 (×4): qty 100

## 2020-07-04 MED ORDER — DIPHENHYDRAMINE HCL 50 MG/ML IJ SOLN
12.5000 mg | Freq: Four times a day (QID) | INTRAMUSCULAR | Status: DC | PRN
  Administered 2020-07-04: 12.5 mg via INTRAVENOUS
  Filled 2020-07-04: qty 1

## 2020-07-04 MED ORDER — ALBUTEROL SULFATE HFA 108 (90 BASE) MCG/ACT IN AERS
1.0000 | INHALATION_SPRAY | RESPIRATORY_TRACT | Status: DC | PRN
  Filled 2020-07-04: qty 6.7

## 2020-07-04 MED ORDER — ACETAMINOPHEN 650 MG RE SUPP: 650.0000 mg | Freq: Four times a day (QID) | RECTAL | Status: DC | PRN

## 2020-07-04 MED ORDER — ENOXAPARIN SODIUM 40 MG/0.4ML ~~LOC~~ SOLN
40.0000 mg | SUBCUTANEOUS | Status: DC
  Administered 2020-07-04 – 2020-07-06 (×3): 40 mg via SUBCUTANEOUS
  Filled 2020-07-04 (×3): qty 0.4

## 2020-07-04 MED ORDER — OXYCODONE HCL 5 MG PO TABS
5.0000 mg | ORAL_TABLET | ORAL | Status: DC | PRN
  Administered 2020-07-04 – 2020-07-05 (×3): 10 mg via ORAL
  Filled 2020-07-04 (×3): qty 2

## 2020-07-04 MED ORDER — POTASSIUM CHLORIDE IN NACL 20-0.9 MEQ/L-% IV SOLN
INTRAVENOUS | Status: DC
  Filled 2020-07-04 (×6): qty 1000

## 2020-07-04 MED ORDER — MORPHINE SULFATE (PF) 2 MG/ML IV SOLN
2.0000 mg | INTRAVENOUS | Status: DC | PRN
  Administered 2020-07-04 – 2020-07-06 (×8): 2 mg via INTRAVENOUS
  Filled 2020-07-04 (×8): qty 1

## 2020-07-04 MED ORDER — SIMETHICONE 80 MG PO CHEW
40.0000 mg | CHEWABLE_TABLET | Freq: Four times a day (QID) | ORAL | Status: DC | PRN
  Administered 2020-07-07 (×2): 40 mg via ORAL
  Filled 2020-07-04 (×2): qty 1

## 2020-07-04 MED ORDER — DOCUSATE SODIUM 100 MG PO CAPS
100.0000 mg | ORAL_CAPSULE | Freq: Two times a day (BID) | ORAL | Status: DC
  Administered 2020-07-04 – 2020-07-06 (×5): 100 mg via ORAL
  Filled 2020-07-04 (×6): qty 1

## 2020-07-04 MED ORDER — DIPHENHYDRAMINE HCL 12.5 MG/5ML PO ELIX: 12.5000 mg | ORAL_SOLUTION | Freq: Four times a day (QID) | ORAL | Status: DC | PRN

## 2020-07-04 MED ORDER — ONDANSETRON HCL 4 MG/2ML IJ SOLN
4.0000 mg | Freq: Once | INTRAMUSCULAR | Status: AC
  Administered 2020-07-04: 4 mg via INTRAVENOUS
  Filled 2020-07-04: qty 2

## 2020-07-04 MED ORDER — PROMETHAZINE HCL 25 MG/ML IJ SOLN
12.5000 mg | Freq: Four times a day (QID) | INTRAMUSCULAR | Status: DC | PRN
  Administered 2020-07-04: 12.5 mg via INTRAVENOUS
  Filled 2020-07-04 (×2): qty 1

## 2020-07-04 MED ORDER — METOPROLOL TARTRATE 5 MG/5ML IV SOLN: 5.0000 mg | Freq: Four times a day (QID) | INTRAVENOUS | Status: DC | PRN

## 2020-07-04 MED ORDER — IOHEXOL 300 MG/ML  SOLN
100.0000 mL | Freq: Once | INTRAMUSCULAR | Status: AC | PRN
  Administered 2020-07-04: 100 mL via INTRAVENOUS

## 2020-07-04 MED ORDER — MORPHINE SULFATE (PF) 4 MG/ML IV SOLN
8.0000 mg | Freq: Once | INTRAVENOUS | Status: AC
  Administered 2020-07-04: 8 mg via INTRAVENOUS
  Filled 2020-07-04: qty 2

## 2020-07-04 MED ORDER — ONDANSETRON 4 MG PO TBDP
4.0000 mg | ORAL_TABLET | Freq: Four times a day (QID) | ORAL | Status: DC | PRN
  Administered 2020-07-04: 4 mg via ORAL
  Filled 2020-07-04: qty 1

## 2020-07-04 MED ORDER — DIATRIZOATE MEGLUMINE & SODIUM 66-10 % PO SOLN
90.0000 mL | Freq: Once | ORAL | Status: AC
  Administered 2020-07-04: 90 mL via ORAL
  Filled 2020-07-04: qty 90

## 2020-07-04 MED ORDER — ACETAMINOPHEN 325 MG PO TABS: 650.0000 mg | ORAL_TABLET | Freq: Four times a day (QID) | ORAL | Status: DC | PRN

## 2020-07-04 MED ORDER — ONDANSETRON HCL 4 MG/2ML IJ SOLN: 4.0000 mg | Freq: Four times a day (QID) | INTRAMUSCULAR | Status: DC | PRN

## 2020-07-04 MED ORDER — LIDOCAINE VISCOUS HCL 2 % MT SOLN
15.0000 mL | Freq: Once | OROMUCOSAL | Status: AC
  Administered 2020-07-04: 15 mL via OROMUCOSAL
  Filled 2020-07-04: qty 15

## 2020-07-04 NOTE — Progress Notes (Signed)
Patient refused NG tube for right now as he was told by MD at ED that they will hold it for right now.  Continues to complain of pain on mostly mid abdomen, sometimes it moves around.
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
## 2020-07-04 NOTE — ED Triage Notes (Signed)
Per EMS, pt from home c/o severe constipation, no movement in two days.  Pt states "it feels like when I had the blockages."  Reports vomiting as well.
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
## 2020-07-05 ENCOUNTER — Inpatient Hospital Stay (HOSPITAL_COMMUNITY): Payer: Self-pay

## 2020-07-05 LAB — BASIC METABOLIC PANEL
Anion gap: 7 (ref 5–15)
BUN: 9 mg/dL (ref 6–20)
CO2: 32 mmol/L (ref 22–32)
Calcium: 8.7 mg/dL — ABNORMAL LOW (ref 8.9–10.3)
Chloride: 101 mmol/L (ref 98–111)
Creatinine, Ser: 1.12 mg/dL (ref 0.61–1.24)
GFR calc Af Amer: >60 mL/min (ref >60)
GFR calc non Af Amer: >60 mL/min (ref >60)
Glucose, Bld: 110 mg/dL — ABNORMAL HIGH (ref 70–99)
Potassium: 4.4 mmol/L (ref 3.5–5.1)
Sodium: 140 mmol/L (ref 135–145)

## 2020-07-05 LAB — PHOSPHORUS: Phosphorus: 4.7 mg/dL — ABNORMAL HIGH (ref 2.5–4.6)

## 2020-07-05 LAB — CBC
HCT: 41.3 % (ref 39.0–52.0)
Hemoglobin: 13.7 g/dL (ref 13.0–17.0)
MCH: 35.1 pg — ABNORMAL HIGH (ref 26.0–34.0)
MCHC: 33.2 g/dL (ref 30.0–36.0)
MCV: 105.9 fL — ABNORMAL HIGH (ref 80.0–100.0)
Platelets: 174 10*3/uL (ref 150–400)
RBC: 3.9 MIL/uL — ABNORMAL LOW (ref 4.22–5.81)
RDW: 13.9 % (ref 11.5–15.5)
WBC: 4.9 10*3/uL (ref 4.0–10.5)
nRBC: 0 % (ref 0.0–0.2)

## 2020-07-05 LAB — MAGNESIUM: Magnesium: 2.1 mg/dL (ref 1.7–2.4)

## 2020-07-05 MED ORDER — ACETAMINOPHEN 10 MG/ML IV SOLN
1000.0000 mg | Freq: Four times a day (QID) | INTRAVENOUS | Status: AC
  Administered 2020-07-05 – 2020-07-06 (×3): 1000 mg via INTRAVENOUS
  Filled 2020-07-05 (×2): qty 100

## 2020-07-05 MED ORDER — DIATRIZOATE MEGLUMINE & SODIUM 66-10 % PO SOLN
90.0000 mL | Freq: Once | ORAL | Status: AC
  Administered 2020-07-05: 90 mL via NASOGASTRIC
  Filled 2020-07-05: qty 90

## 2020-07-05 MED ORDER — PHENOL 1.4 % MT LIQD
1.0000 | OROMUCOSAL | Status: DC | PRN
  Administered 2020-07-06: 1 via OROMUCOSAL
  Filled 2020-07-05: qty 177
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
## 2020-07-05 NOTE — TOC Initial Note (Signed)
Transition of Care [facility]) - Initial/Assessment Note    Patient Details  Name: AJAY STRUBEL MRN: [id number] Date of Birth: [birth year]  Transition of Care [facility]) CM/SW Contact:    Kingsley Plan, RN Phone Number: 07/05/2020, 11:44 AM  Clinical Narrative:                  Patient from home. Has used [facility] recently X 2 . Pharmacy changed to [facility].   Active with [facility], scheduled follow up appointment for July 28, 2020 at 1:50 pm.  Will continue to follow.   Emailed [employer]  Expected Discharge Plan: Home/Self Care Barriers to Discharge: Continued Medical Work up   Patient Goals and CMS Choice Patient states their goals for this hospitalization and ongoing recovery are:: to return to home CMS Medicare.gov Compare Post Acute Care list provided to:: Patient Choice offered to / list presented to : NA  Expected Discharge Plan and Services Expected Discharge Plan: Home/Self Care In-house Referral: Financial Counselor Discharge Planning Services: CM Consult   Living arrangements for the past 2 months: Single Family Home                 DME Arranged: N/A         HH Arranged: NA          Prior Living Arrangements/Services Living arrangements for the past 2 months: Single Family Home Lives with:: Spouse Patient language and need for interpreter reviewed:: Yes        Need for Family Participation in Patient Care: Yes (Comment) Care giver support system in place?: Yes (comment)   Criminal Activity/Legal Involvement Pertinent to Current Situation/Hospitalization: No - Comment as needed  Activities of Daily Living      Permission Sought/Granted   Permission granted to share information with : No              Emotional Assessment Appearance:: Appears stated age     Orientation: : Oriented to Self, Oriented to Place, Oriented to  Time, Oriented to Situation Alcohol / Substance Use: Not Applicable Psych Involvement: No (comment)  Admission  diagnosis:  Small bowel obstruction (HCC) [K56.609] SBO (small bowel obstruction) (HCC) [K56.609] Patient Active Problem List   Diagnosis Date Noted  . Small bowel obstruction (HCC) 03/19/2020  . Aspiration pneumonia (HCC) 02/20/2019  . Lactic acid acidosis 02/20/2019  . Sepsis, Gram negative (HCC) 02/20/2019  . Acute renal failure (ARF) (HCC) 02/20/2019  . Alcoholic hepatitis without ascites 02/20/2019  . Post-ictal state (HCC) 10/06/2018  . SBO (small bowel obstruction) (HCC) 08/10/2016  . Polysubstance abuse (HCC) 08/10/2016  . 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 Seizure disorder (HCC) 11/23/2015  . Poor dentition 11/23/2015  . Macrocytic anemia   . Dysphagia   . Dyshidrotic eczema 06/11/2012  . Tobacco abuse 06/10/2012  . Oral candidiasis 05/20/2012  . Tinea corporis 05/20/2012  . 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 Alcohol abuse 01/24/2010  . DEPRESSION 01/24/2010  . COPD (chronic obstructive pulmonary disease) (HCC) 01/24/2010  . GERD 01/24/2010  . OSTEOARTHRITIS 01/24/2010  . Personal history of other disorders of nervous system and sense organs 01/24/2010   PCP:  Patient, No Pcp Per Pharmacy:   [id number] - [address] ([place]), [place] - [address] [place] ([place]) [place] [address] Phone: [phone] Fax: [phone]  [facility] [place], [place] - [address]  [place] [address] Phone: [phone] Fax: [phone]     Social Determinants of Health (SDOH) Interventions    Readmission Risk Interventions Readmission Risk Prevention Plan 02/24/2019  Transportation Screening Complete  PCP or Specialist Appt within 3-5 Days Complete  HRI or Home Care Consult Complete  Social Work Consult for Recovery Care Planning/Counseling Complete  Palliative Care Screening Not Applicable  Medication Review Oceanographer) Complete  Some  recent data might be hidden

## 2020-07-06 ENCOUNTER — Inpatient Hospital Stay (HOSPITAL_COMMUNITY): Payer: Self-pay
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
## 2020-07-07 MED ORDER — OXYCODONE HCL 5 MG PO TABS: 5.0000 mg | ORAL_TABLET | Freq: Four times a day (QID) | ORAL | 0 refills | Status: DC | PRN

## 2020-07-07 MED ORDER — POLYETHYLENE GLYCOL 3350 17 G PO PACK: 17.0000 g | PACK | Freq: Every day | ORAL | 0 refills | Status: DC

## 2020-07-07 MED ORDER — DOCUSATE SODIUM 100 MG PO CAPS: 100.0000 mg | ORAL_CAPSULE | Freq: Two times a day (BID) | ORAL | 0 refills | Status: DC

## 2020-07-07 MED ORDER — ACETAMINOPHEN 325 MG PO TABS: 650.0000 mg | ORAL_TABLET | Freq: Four times a day (QID) | ORAL | Status: AC | PRN

## 2020-07-07 MED ORDER — SIMETHICONE 80 MG PO CHEW: 40.0000 mg | CHEWABLE_TABLET | Freq: Four times a day (QID) | ORAL | Status: DC | PRN

## 2020-07-07 MED FILL — POLYETHYLENE GLYCOL 3350 PO: 17 | 14 days supply | Qty: 238 | Fill #0

## 2020-07-07 MED FILL — DOCUSATE SODIUM 100 MG CAPS: 100 | 15 days supply | Qty: 30 | Fill #0

## 2020-07-07 MED FILL — oxyCODONE HCL 5 MG TABS: 5 | 3 days supply | Qty: 10 | Fill #0

## 2020-07-07 NOTE — Discharge Instructions (Signed)
Bowel Obstruction A bowel obstruction means that something is blocking the small or large bowel. The bowel is also called the intestine. It is the long tube that connects the stomach to the opening of the butt (anus). When something blocks the bowel, food and fluids cannot pass through like normal. This condition needs to be treated. Treatment depends on the cause of the problem and how bad the problem is. What are the causes? Common causes of this condition include:  Scar tissue (adhesions) from past surgery or from high-energy X-rays (radiation).  Recent surgery in the belly. This affects how food moves in the bowel.  Some diseases, such as: ? Irritation of the lining of the digestive tract (Crohn's disease). ? Irritation of small pouches in the bowel (diverticulitis).  Growths or tumors.  A bulging organ (hernia).  Twisting of the bowel (volvulus).  A foreign body.  Slipping of a part of the bowel into another part (intussusception). What are the signs or symptoms? Symptoms of this condition include:  Pain in the belly.  Feeling sick to your stomach (nauseous).  Throwing up (vomiting).  Bloating in the belly.  Being unable to pass gas.  Trouble pooping (constipation).  Watery poop (diarrhea).  A lot of belching. How is this diagnosed? This condition may be diagnosed based on:  A physical exam.  Medical history.  Imaging tests, such as X-ray or CT scan.  Blood tests.  Urine tests. How is this treated? Treatment for this condition may include:  Fluids and pain medicines that are given through an IV tube. Your doctor may tell you not to eat or drink if you feel sick to your stomach and are throwing up.  Eating a clear liquid diet for a few days.  Putting a small tube (nasogastric tube) into the stomach. This will help with pain, discomfort, and nausea by removing blocked air and fluids from the stomach.  Surgery. This may be needed if other treatments do  not work. Follow these instructions at home: Medicines  Take over-the-counter and prescription medicines only as told by your doctor.  If you were prescribed an antibiotic medicine, take it as told by your doctor. Do not stop taking the antibiotic even if you start to feel better. General instructions  Follow your diet as told by your doctor. You may need to: ? Only drink clear liquids until you start to get better. ? Avoid solid foods.  Return to your normal activities as told by your doctor. Ask your doctor what activities are safe for you.  Do not sit for a long time without moving. Get up to take short walks every 1-2 hours. This is important. Ask for help if you feel weak or unsteady.  Keep all follow-up visits as told by your doctor. This is important. How is this prevented? After having a bowel obstruction, you may be more likely to have another. You can do some things to stop it from happening again.  If you have a long-term (chronic) disease, contact your doctor if you see changes or problems.  Take steps to prevent or treat trouble pooping. Your doctor may ask that you: ? Drink enough fluid to keep your pee (urine) pale yellow. ? Take over-the-counter or prescription medicines. ? Eat foods that are high in fiber. These include beans, whole grains, and fresh fruits and vegetables. ? Limit foods that are high in fat and sugar. These include fried or sweet foods.  Stay active. Ask your doctor which exercises are  safe for you.  Avoid stress.  Eat three small meals and three small snacks each day.  Work with a Psychologist, prison and probation services (dietitian) to make a meal plan that works for you.  Do not use any products that contain nicotine or tobacco, such as cigarettes and e-cigarettes. If you need help quitting, ask your doctor. Contact a doctor if:  You have a fever.  You have chills. Get help right away if:  You have pain or cramps that get worse.  You throw up blood.  You are  sick to your stomach.  You cannot stop throwing up.  You cannot drink fluids.  You feel mixed up (confused).  You feel very thirsty (dehydrated).  Your belly gets more bloated.  You feel weak or you pass out (faint). Summary  A bowel obstruction means that something is blocking the small or large bowel.  Treatment may include IV fluids and pain medicine. You may also have a clear liquid diet, a small tube in your stomach, or surgery.  Drink clear liquids and avoid solid foods until you get better. This information is not intended to replace advice given to you by your health care provider. Make sure you discuss any questions you have with your health care provider. Document Revised: 02/19/2018 Document Reviewed: 02/19/2018 Elsevier Patient Education  2020 [employer].    [place] Diet A bland diet consists of foods that are often soft and do not have a lot of fat, fiber, or extra seasonings. Foods without fat, fiber, or seasoning are easier for the body to digest. They are also less likely to irritate your mouth, throat, stomach, and other parts of your digestive system. A bland diet is sometimes called a BRAT diet. What is my plan? Your health care provider or food and nutrition specialist (dietitian) may recommend specific changes to your diet to prevent symptoms or to treat your symptoms. These changes may include:  Eating small meals often.  Cooking food until it is soft enough to chew easily.  Chewing your food well.  Drinking fluids slowly.  Not eating foods that are very spicy, sour, or fatty.  Not eating citrus fruits, such as oranges and grapefruit. What do I need to know about this diet?  Eat a variety of foods from the bland diet food list.  Do not follow a bland diet longer than needed.  Ask your health care provider whether you should take vitamins or supplements. What foods can I eat? Grains  Hot cereals, such as cream of wheat. Rice. Bread, crackers,  or tortillas made from refined white flour. Vegetables Canned or cooked vegetables. Mashed or boiled potatoes. Fruits  Bananas. Applesauce. Other types of cooked or canned fruit with the skin and seeds removed, such as canned peaches or pears. Meats and other proteins  Scrambled eggs. Creamy peanut butter or other nut butters. Lean, well-cooked meats, such as chicken or fish. Tofu. Soups or broths. Dairy Low-fat dairy products, such as milk, cottage cheese, or yogurt. Beverages  Water. Herbal tea. Apple juice. Fats and oils Mild salad dressings. Canola or olive oil. Sweets and desserts Pudding. Custard. Fruit gelatin. Ice cream. The items listed above may not be a complete list of recommended foods and beverages. Contact a dietitian for more options. What foods are not recommended? Grains Whole grain breads and cereals. Vegetables Raw vegetables. Fruits Raw fruits, especially citrus, berries, or dried fruits. Dairy Whole fat dairy foods. Beverages Caffeinated drinks. Alcohol. Seasonings and condiments Strongly flavored seasonings or condiments.  Hot sauce. Salsa. Other foods Spicy foods. Fried foods. Sour foods, such as pickled or fermented foods. Foods with high sugar content. Foods high in fiber. The items listed above may not be a complete list of foods and beverages to avoid. Contact a dietitian for more information. Summary  A bland diet consists of foods that are often soft and do not have a lot of fat, fiber, or extra seasonings.  Foods without fat, fiber, or seasoning are easier for the body to digest.  Check with your health care provider to see how long you should follow this diet plan. It is not meant to be followed for long periods. This information is not intended to replace advice given to you by your health care provider. Make sure you discuss any questions you have with your health care provider. Document Revised: 11/06/2017 Document Reviewed:  11/06/2017 Elsevier Patient Education  2020 [employer].

## 2020-07-07 NOTE — Discharge Summary (Signed)
[facility] Discharge Summary   Patient ID: David Benson MRN: [id number] DOB/AGE: June 22, 1966 54 y.o.  Admit date: 07/04/2020 Discharge date: 07/07/2020  Admitting Diagnosis: Recurrent SBO Recent COVID-19 infection  COPD Current tobacco abuse  Discharge Diagnosis Recurrent SBO Recent COVID-19 infection  COPD Current tobacco abuse  Consultants None   Imaging: DG Abd Portable 1V-Small Bowel Obstruction Protocol-24 hr delay  Result Date: 07/06/2020 CLINICAL DATA:  Small bowel obstruction EXAM: PORTABLE ABDOMEN - 1 VIEW COMPARISON:  July 05, 2020 FINDINGS: Nasogastric tube tip and side port in stomach. Contrast present in portions of distal and colon. No small bowel dilatation evident. Descending colon diameter upper normal. No air-fluid levels. No free air. Extensive metallic foreign bodies noted left abdomen. IMPRESSION: Contrast seen in distal small bowel and proximal colon. No overt bowel obstruction or free air. Multiple metallic foreign bodies on the left. Nasogastric tube tip and side port in stomach. Electronically Signed   By: Bretta Bang III M.D.   On: 07/06/2020 08:01   DG Abd Portable 1V-Small Bowel Obstruction Protocol-initial, 8 hr delay  Result Date: 07/05/2020 CLINICAL DATA:  Small-bowel 8 hour delay EXAM: PORTABLE ABDOMEN - 1 VIEW COMPARISON:  X-ray abdomen pelvis 07/04/2020, CT abdomen pelvis 07/04/2020 FINDINGS: PO contrast noted to reach the large bowel. Persistent gaseous distention of the small and large bowel with difficulty measuring a loop of small bowel. Multiple metallic BBs overlie the left and mid abdomen/pelvis and are consistent with known retained foreign bodies. IMPRESSION: 1. PO contrast noted within the lumen of the colon. 2. Persistent gaseous distension of the small and large bowel with difficulty measuring a the small bowel. 3. Findings representing a partial small bowel obstruction versus ileus. Electronically Signed   By:  Tish Frederickson M.D.   On: 07/05/2020 23:11    Procedures None   Hospital Course:  Patient is a 54 year old male who presented to [facility] with abdominal pain and nausea and vomiting.  Workup showed recurrent small bowel obstruction.  Patient was admitted and initiated on the small bowel obstruction protocol. Patient improved with conservative treatment and diet was advanced as tolerated. With increasing frequency of obstructive symptoms it was recommended that patient undergo diagnostic laparoscopy vs laparotomy with lysis of adhesions, however with very recent COVID-19 infection this should be done at a later date if possible.  On 07/07/20, the patient was voiding well, tolerating diet, ambulating well, pain well controlled, vital signs stable and felt stable for discharge home.  Patient will follow up in our office as listed below to plan for elective diagnostic laparoscopy once far enough out from COVID infection and knows to call with questions or concerns.  He will call to confirm appointment date/time.    Physical Exam: General: pleasant, WD, WN male who is laying in bed in NAD HEENT: Sclera are noninjected. PERRL. Ears and nose without any masses or lesions. Mouth is pink and moist Heart: regular, rate, and rhythm. Normal s1,s2. No obvious murmurs, gallops, or rubs noted. Palpable radial and pedal pulses bilaterally Lungs: CTAB, no wheezes, rhonchi, or rales noted. Respiratory effort nonlabored Abd: soft,non-tender, ND,midline surgical scar, BS normoactive MS: all 4 extremities are symmetrical with no cyanosis, clubbing, or edema. Skin: warm and dry with no masses, lesions, or rashes Neuro: Cranial nerves 2-12 grossly intact, sensation grossly intact Psych: A&Ox3 with an appropriate affect.  I or a member of my team have reviewed this patient in the Controlled Substance Database.   Allergies as of 07/07/2020  Reactions   Naprosyn [naproxen] Shortness Of Breath, Swelling,  Other (See Comments)   Tongue became swollen and developed white spots   Other Itching, Other (See Comments)   Seasonal allergies- Itchy eyes, congestion, runny nose, etc..   Penicillins Itching   Has patient had a PCN reaction causing immediate rash, facial/tongue/throat swelling, SOB or lightheadedness with hypotension: No Has patient had a PCN reaction causing severe rash involving mucus membranes or skin necrosis: No Has patient had a PCN reaction that required hospitalization No Has patient had a PCN reaction occurring within the last 10 years: No If all of the above answers are "NO", then may proceed with Cephalosporin use.   Pork-derived Products Other (See Comments)   Pt does not eat pork   Adhesive [tape] Rash, Other (See Comments)   Patient cannot tolerate this for a period of time      Medication List    STOP taking these medications   methocarbamol 500 MG tablet Commonly known as: ROBAXIN     TAKE these medications   acetaminophen 325 MG tablet Commonly known as: TYLENOL Take 2 tablets (650 mg total) by mouth every 6 (six) hours as needed for mild pain (or temp > 100).   albuterol 108 (90 Base) MCG/ACT inhaler Commonly known as: VENTOLIN HFA Inhale 2 puffs into the lungs every 6 (six) hours as needed for wheezing or shortness of breath.   docusate sodium 100 MG capsule Commonly known as: COLACE Take 1 capsule (100 mg total) by mouth 2 (two) times daily. What changed:   medication strength  how much to take  when to take this  reasons to take this   guaiFENesin 100 MG/5ML liquid Commonly known as: ROBITUSSIN Take 200 mg by mouth 3 (three) times daily as needed for cough or congestion.   Muscle Rub 10-15 % Crea Apply 1 application topically as needed for muscle pain.   oxyCODONE 5 MG immediate release tablet Commonly known as: Oxy IR/ROXICODONE Take 1-2 tablets (5-10 mg total) by mouth every 6 (six) hours as needed for moderate pain or severe pain.    polyethylene glycol 17 g packet Commonly known as: MiraLax Take 17 g by mouth daily.   simethicone 80 MG chewable tablet Commonly known as: MYLICON Chew 0.5 tablets (40 mg total) by mouth every 6 (six) hours as needed for flatulence (bloating).         Follow-up Information    [facility] Follow up.   Why: July 28, 2020 at 1:50 pm  Contact information: [address] [place] [address] [phone]       Gaynelle Adu, MD. Go on 08/19/2020.   Specialty: General Surgery Why: Your appointment is 08/19/2020 at 1:30pm Please arrive 30 minutes prior to your appointment to check in and fill out paperwork. Bring photo ID and insurance information. Contact information: [address] [place] [address] [phone]               Signed: Juliet Rude , [facility] 07/07/2020, 8:29 AM Please see Amion for pager number during day hours 7:00am-4:30pm

## 2020-07-07 NOTE — Progress Notes (Signed)
Paged Almond Lint MD regarding patient diet order this a.m. MD advised to continue SOFT diet at this time.  Will relay message to oncoming shift.

## 2020-07-07 NOTE — Progress Notes (Signed)
Patient discharged to home. Verbalizes understanding of all discharge instructions including discharge medications and follow up MD visits.  

## 2020-07-28 ENCOUNTER — Ambulatory Visit: Payer: Self-pay | Attending: Physician Assistant | Admitting: Physician Assistant

## 2020-07-28 ENCOUNTER — Other Ambulatory Visit: Payer: Self-pay

## 2020-08-04 ENCOUNTER — Ambulatory Visit (INDEPENDENT_AMBULATORY_CARE_PROVIDER_SITE_OTHER): Payer: Self-pay

## 2020-08-04 ENCOUNTER — Ambulatory Visit (HOSPITAL_COMMUNITY): Payer: Self-pay

## 2020-08-04 ENCOUNTER — Ambulatory Visit (HOSPITAL_COMMUNITY)
Admission: EM | Admit: 2020-08-04 | Discharge: 2020-08-04 | Disposition: A | Discharge status: Home / Self Care | Payer: Self-pay | Attending: Urgent Care | Admitting: Urgent Care

## 2020-08-04 ENCOUNTER — Other Ambulatory Visit: Payer: Self-pay

## 2020-08-04 ENCOUNTER — Encounter (HOSPITAL_COMMUNITY): Payer: Self-pay | Admitting: Emergency Medicine

## 2020-08-04 DIAGNOSIS — R936 Abnormal findings on diagnostic imaging of limbs: Secondary | ICD-10-CM

## 2020-08-04 DIAGNOSIS — M25522 Pain in left elbow: Secondary | ICD-10-CM

## 2020-08-04 DIAGNOSIS — M79602 Pain in left arm: Secondary | ICD-10-CM

## 2020-08-04 MED ORDER — HYDROCODONE-ACETAMINOPHEN 5-325 MG PO TABS: 1.0000 | ORAL_TABLET | Freq: Four times a day (QID) | ORAL | 0 refills | Status: DC | PRN

## 2020-08-04 NOTE — Progress Notes (Signed)
Orthopedic Tech Progress Note Patient Details:  David Benson 18-Mar-1966 [id number]  Ortho Devices Type of Ortho Device: Arm sling, Long arm splint Ortho Device/Splint Location: ULE Ortho Device/Splint Interventions: Application, Ordered   Post Interventions Patient Tolerated: Well Instructions Provided: Adjustment of device, Care of device, Poper ambulation with device   Jerrell Mangel A Deane Melick 08/04/2020, 6:34 PM

## 2020-08-04 NOTE — Discharge Instructions (Addendum)
You have an abnormal x-ray that suggests you may have a fracture.  For this reason I am placing you in a posterior splint.  Make sure you keep this on.  Contact Dr. Samson Frederic with [facility] first thing tomorrow.  They will be following up with the and doing further testing and management.  In the meantime, please schedule Tylenol at 500 mg - 650 mg once every 6 hours as needed for aches and pains.  If you still have pain despite taking Tylenol regularly, this is breakthrough pain.  You can use hydrocodone once every 6 hours for this.  Once your pain is better controlled, switch back to just Tylenol.

## 2020-08-04 NOTE — ED Triage Notes (Signed)
Pt presents to [facility] for assessment of left arm pain and swelling since yesterday.  Denies injury, no hx to that arm
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
## 2020-08-05 ENCOUNTER — Other Ambulatory Visit (HOSPITAL_COMMUNITY): Payer: Self-pay | Admitting: Family Medicine

## 2020-08-05 ENCOUNTER — Telehealth (HOSPITAL_COMMUNITY): Payer: Self-pay | Admitting: Emergency Medicine

## 2020-08-05 MED FILL — HYDROCODON-APAP 5-325: 5-325 | 3 days supply | Qty: 10 | Fill #0

## 2020-08-05 NOTE — Telephone Encounter (Signed)
Received voicemail that patient's prescription did not go through.  Looked to verify transmission failed.  Called and left voicemail for verbal order at pharmacy

## 2020-08-22 ENCOUNTER — Other Ambulatory Visit: Payer: Self-pay

## 2020-08-22 ENCOUNTER — Emergency Department (HOSPITAL_COMMUNITY)
Admission: EM | Admit: 2020-08-22 | Discharge: 2020-08-22 | Disposition: A | Discharge status: Home / Self Care | Payer: Self-pay | Attending: Emergency Medicine | Admitting: Emergency Medicine

## 2020-08-22 DIAGNOSIS — F10920 Alcohol use, unspecified with intoxication, uncomplicated: Secondary | ICD-10-CM | POA: Insufficient documentation

## 2020-08-22 DIAGNOSIS — R569 Unspecified convulsions: Secondary | ICD-10-CM | POA: Insufficient documentation

## 2020-08-22 DIAGNOSIS — F17210 Nicotine dependence, cigarettes, uncomplicated: Secondary | ICD-10-CM | POA: Insufficient documentation

## 2020-08-22 DIAGNOSIS — J449 Chronic obstructive pulmonary disease, unspecified: Secondary | ICD-10-CM | POA: Insufficient documentation

## 2020-08-22 LAB — COMPREHENSIVE METABOLIC PANEL
ALT: 27 U/L (ref 0–44)
AST: 32 U/L (ref 15–41)
Albumin: 3.5 g/dL (ref 3.5–5.0)
Alkaline Phosphatase: 56 U/L (ref 38–126)
Anion gap: 14 (ref 5–15)
BUN: 10 mg/dL (ref 6–20)
CO2: 22 mmol/L (ref 22–32)
Calcium: 8.5 mg/dL — ABNORMAL LOW (ref 8.9–10.3)
Chloride: 102 mmol/L (ref 98–111)
Creatinine, Ser: 1.12 mg/dL (ref 0.61–1.24)
GFR, Estimated: >60 mL/min (ref >60)
Glucose, Bld: 95 mg/dL (ref 70–99)
Potassium: 3.8 mmol/L (ref 3.5–5.1)
Sodium: 138 mmol/L (ref 135–145)
Total Bilirubin: 0.8 mg/dL (ref 0.3–1.2)
Total Protein: 6.4 g/dL — ABNORMAL LOW (ref 6.5–8.1)

## 2020-08-22 LAB — CBC WITH DIFFERENTIAL/PLATELET
Abs Immature Granulocytes: 0 10*3/uL (ref 0.00–0.07)
Basophils Absolute: 0 10*3/uL (ref 0.0–0.1)
Basophils Relative: 1 %
Eosinophils Absolute: 0.2 10*3/uL (ref 0.0–0.5)
Eosinophils Relative: 4 %
HCT: 42.8 % (ref 39.0–52.0)
Hemoglobin: 14.1 g/dL (ref 13.0–17.0)
Immature Granulocytes: 0 %
Lymphocytes Relative: 59 %
Lymphs Abs: 2.3 10*3/uL (ref 0.7–4.0)
MCH: 34.7 pg — ABNORMAL HIGH (ref 26.0–34.0)
MCHC: 32.9 g/dL (ref 30.0–36.0)
MCV: 105.4 fL — ABNORMAL HIGH (ref 80.0–100.0)
Monocytes Absolute: 0.4 10*3/uL (ref 0.1–1.0)
Monocytes Relative: 10 %
Neutro Abs: 1 10*3/uL — ABNORMAL LOW (ref 1.7–7.7)
Neutrophils Relative %: 26 %
Platelets: 211 10*3/uL (ref 150–400)
RBC: 4.06 MIL/uL — ABNORMAL LOW (ref 4.22–5.81)
RDW: 13.5 % (ref 11.5–15.5)
WBC: 3.8 10*3/uL — ABNORMAL LOW (ref 4.0–10.5)
nRBC: 0.5 % — ABNORMAL HIGH (ref 0.0–0.2)

## 2020-08-22 LAB — ETHANOL: Alcohol, Ethyl (B): 184 mg/dL — ABNORMAL HIGH (ref <10)

## 2020-08-22 MED ORDER — LEVETIRACETAM IN NACL 1000 MG/100ML IV SOLN
1000.0000 mg | Freq: Once | INTRAVENOUS | Status: AC
  Administered 2020-08-22: 1000 mg via INTRAVENOUS
  Filled 2020-08-22: qty 100

## 2020-08-22 MED ORDER — LEVETIRACETAM 1000 MG PO TABS: 1000.0000 mg | ORAL_TABLET | Freq: Two times a day (BID) | ORAL | 0 refills | Status: DC
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
## 2020-08-22 NOTE — ED Notes (Signed)
Patient ambulated in hallway with steady gait.

## 2020-08-22 NOTE — ED Provider Notes (Signed)
Patient signed out to me at beginning of shift.  History of seizure noncompliant with his medication who according to wife has had multiple seizure-like activities throughout the day today.  States he normally would have these episodes when he is upset.  Patient has been monitored in the ED for the past few hours. At this time he is at his baseline.  He is able to ambulate and is clinically sober.  Initial alcohol level is 184.  No seizure medication listed on his chart that I can find.  Patient was giving a Keppra loading dose in the ED, and will be discharged home with Keppra.  I recommend patient to follow-up with neurologist outpatient for further regulation but return if symptoms worsen.    BP 107/80   Pulse 60   Resp 14   Ht 5\' 9"  (1.753 m)   Wt 77 kg   SpO2 99%   BMI 25.07 kg/m   Results for orders placed or performed during the hospital encounter of 08/22/20  CBC with Differential  Result Value Ref Range   WBC 3.8 (L) 4.0 - 10.5 K/uL   RBC 4.06 (L) 4.22 - 5.81 MIL/uL   Hemoglobin 14.1 13.0 - 17.0 g/dL   HCT 13/01/21 39 - 52 %   MCV 105.4 (H) 80.0 - 100.0 fL   MCH 34.7 (H) 26.0 - 34.0 pg   MCHC 32.9 30.0 - 36.0 g/dL   RDW 77.8 24.2 - 35.3 %   Platelets 211 150 - 400 K/uL   nRBC 0.5 (H) 0.0 - 0.2 %   Neutrophils Relative % 26 %   Neutro Abs 1.0 (L) 1.7 - 7.7 K/uL   Lymphocytes Relative 59 %   Lymphs Abs 2.3 0.7 - 4.0 K/uL   Monocytes Relative 10 %   Monocytes Absolute 0.4 0.1 - 1.0 K/uL   Eosinophils Relative 4 %   Eosinophils Absolute 0.2 0.0 - 0.5 K/uL   Basophils Relative 1 %   Basophils Absolute 0.0 0.0 - 0.1 K/uL   Immature Granulocytes 0 %   Abs Immature Granulocytes 0.00 0.00 - 0.07 K/uL  Comprehensive metabolic panel  Result Value Ref Range   Sodium 138 135 - 145 mmol/L   Potassium 3.8 3.5 - 5.1 mmol/L   Chloride 102 98 - 111 mmol/L   CO2 22 22 - 32 mmol/L   Glucose, Bld 95 70 - 99 mg/dL   BUN 10 6 - 20 mg/dL   Creatinine, Ser 61.4 0.61 - 1.24 mg/dL   Calcium  8.5 (L) 8.9 - 10.3 mg/dL   Total Protein 6.4 (L) 6.5 - 8.1 g/dL   Albumin 3.5 3.5 - 5.0 g/dL   AST 32 15 - 41 U/L   ALT 27 0 - 44 U/L   Alkaline Phosphatase 56 38 - 126 U/L   Total Bilirubin 0.8 0.3 - 1.2 mg/dL   GFR, Estimated 4.31 >54 mL/min   Anion gap 14 5 - 15  Ethanol  Result Value Ref Range   Alcohol, Ethyl (B) 184 (H) <10 mg/dL   DG Elbow Complete Left  Result Date: 08/04/2020 CLINICAL DATA:  Pain EXAM: LEFT ELBOW - COMPLETE 3+ VIEW COMPARISON:  None. FINDINGS: There is soft tissue swelling about the posterior aspect of the elbow. There is a questionable trace anterior elbow joint effusion. There is a sclerotic line coursing through the radial head. IMPRESSION: 1. Soft tissue swelling about the elbow with a possible small anterior joint effusion. 2. Sclerotic line through the radial head is  of unknown clinical significance, especially in the absence of reported trauma. If there is clinical suspicion for an elbow fracture, repeat radiographs are recommended in 10-14 days for further evaluation as the above finding may represent a nondisplaced fracture. Electronically Signed   By: Katherine Mantle M.D.   On: 08/04/2020 17:52      Fayrene Helper, PA-C 08/22/20 1035    Alvira Monday, MD 08/22/20 [phone]

## 2020-08-22 NOTE — Discharge Instructions (Addendum)
Please make sure to take your keppra.  I have sent new prescription to your pharmacy for you. Avoid illicit drugs and heavy alcohol use. Follow-up with your primary care doctor. Would also be good for you to follow-up with neurology.  Can call to schedule appointment. Return here for new concerns.

## 2020-08-22 NOTE — ED Triage Notes (Signed)
Assume care from EMS, Where they report pt had 3 seizures at home witness by wife. EMS states first seizure reported by wife lasted , then had two more. EMS reports upon arrival pt had another seizure. EMS reports pt has an hx of seizure and is non compliant with meds and also ETOH abuse used tonight, and hx of drug abuse. Ems Reports given pt 5 of Versed   115/77 96 RA  HR 82 CBG 136

## 2020-08-22 NOTE — ED Notes (Signed)
Patient's wife at bedside.

## 2021-02-08 ENCOUNTER — Other Ambulatory Visit: Payer: Self-pay

## 2021-02-08 ENCOUNTER — Ambulatory Visit (HOSPITAL_COMMUNITY)
Admission: EM | Admit: 2021-02-08 | Discharge: 2021-02-08 | Disposition: A | Discharge status: Home / Self Care | Payer: Self-pay | Attending: Family Medicine | Admitting: Family Medicine

## 2021-02-08 DIAGNOSIS — R369 Urethral discharge, unspecified: Secondary | ICD-10-CM | POA: Insufficient documentation

## 2021-02-08 MED ORDER — CEFTRIAXONE SODIUM 500 MG IJ SOLR
INTRAMUSCULAR | Status: AC
  Filled 2021-02-08: qty 500

## 2021-02-08 MED ORDER — CEFTRIAXONE SODIUM 500 MG IJ SOLR
500.0000 mg | Freq: Once | INTRAMUSCULAR | Status: AC
  Administered 2021-02-08: 500 mg via INTRAMUSCULAR

## 2021-02-08 MED ORDER — LIDOCAINE HCL (PF) 1 % IJ SOLN
INTRAMUSCULAR | Status: AC
  Filled 2021-02-08: qty 2

## 2021-02-08 NOTE — ED Triage Notes (Signed)
PT reports he has a STD and has dischargr from penis.
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
## 2021-02-09 ENCOUNTER — Telehealth (HOSPITAL_COMMUNITY): Payer: Self-pay | Admitting: Emergency Medicine

## 2021-02-09 ENCOUNTER — Other Ambulatory Visit (HOSPITAL_COMMUNITY): Payer: Self-pay

## 2021-02-09 LAB — CYTOLOGY, (ORAL, ANAL, URETHRAL) ANCILLARY ONLY
Chlamydia: NEGATIVE
Comment: NEGATIVE
Comment: NEGATIVE
Comment: NORMAL
Neisseria Gonorrhea: POSITIVE — AB
Trichomonas: POSITIVE — AB

## 2021-02-09 MED ORDER — METRONIDAZOLE 500 MG PO TABS
500.0000 mg | ORAL_TABLET | Freq: Two times a day (BID) | ORAL | 0 refills | Status: DC
  Filled 2021-02-09: qty 14, 7d supply, fill #0

## 2021-02-16 ENCOUNTER — Other Ambulatory Visit (HOSPITAL_COMMUNITY): Payer: Self-pay

## 2021-02-17 ENCOUNTER — Telehealth (HOSPITAL_COMMUNITY): Payer: Self-pay

## 2021-02-17 NOTE — Telephone Encounter (Signed)
Pt called to UC re lab results.  Advised of Trich and Gonorrhea and that Flagyl was already sent to pharmacy.   Test for gonorrhea was positive. This was treated at the urgent care visit with IM rocephin 500mg . Please refrain from sexual intercourse for 7 days after treatment to give the medicine time to work.  Sexual partners need to be notified and tested/treated.  Condoms may reduce risk of reinfection.  Recheck or followup with PCP for further evaluation if symptoms are not improving.

## 2021-04-05 ENCOUNTER — Ambulatory Visit (INDEPENDENT_AMBULATORY_CARE_PROVIDER_SITE_OTHER): Payer: Self-pay

## 2021-04-05 ENCOUNTER — Encounter (HOSPITAL_COMMUNITY): Payer: Self-pay | Admitting: Emergency Medicine

## 2021-04-05 ENCOUNTER — Other Ambulatory Visit: Payer: Self-pay

## 2021-04-05 ENCOUNTER — Ambulatory Visit (HOSPITAL_COMMUNITY)
Admission: EM | Admit: 2021-04-05 | Discharge: 2021-04-05 | Disposition: A | Discharge status: Home / Self Care | Payer: Self-pay | Attending: Emergency Medicine | Admitting: Emergency Medicine

## 2021-04-05 DIAGNOSIS — M25442 Effusion, left hand: Secondary | ICD-10-CM

## 2021-04-05 DIAGNOSIS — M79645 Pain in left finger(s): Secondary | ICD-10-CM

## 2021-04-05 MED ORDER — PREDNISONE 20 MG PO TABS: 40.0000 mg | ORAL_TABLET | Freq: Every day | ORAL | 0 refills | Status: AC

## 2021-04-05 NOTE — Discharge Instructions (Addendum)
Take the prednisone daily for the next 5 days.    You can take Tylenol and/or Ibuprofen as needed for pain relief and fever reduction.    Rest as much as possible Ice for 10-15 minutes every 4-6 hours as needed for pain and swelling Compression- use an ace bandage or splint for comfort Elevate above your hip/heart when sitting and laying down  Return or follow up with sports medicine or orthopedics if symptoms do not improve in the next few days.

## 2021-04-05 NOTE — ED Triage Notes (Signed)
Pt is present today with left hand pain. Pt states that he noticed the pain one week ago. Pt middle finger does have visible swelling. Pt states that it is hard to make a fist without feeling discomfort. Pt denies any injury
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
## 2022-03-01 ENCOUNTER — Ambulatory Visit: Payer: Self-pay | Admitting: Student

## 2022-03-24 ENCOUNTER — Other Ambulatory Visit: Payer: Self-pay

## 2022-03-24 ENCOUNTER — Emergency Department (HOSPITAL_COMMUNITY)
Admission: EM | Admit: 2022-03-24 | Discharge: 2022-03-25 | Disposition: A | Discharge status: Home / Self Care | Payer: Self-pay | Attending: Emergency Medicine | Admitting: Emergency Medicine

## 2022-03-24 DIAGNOSIS — E871 Hypo-osmolality and hyponatremia: Secondary | ICD-10-CM | POA: Insufficient documentation

## 2022-03-24 DIAGNOSIS — R079 Chest pain, unspecified: Secondary | ICD-10-CM | POA: Diagnosis not present

## 2022-03-24 DIAGNOSIS — Y92410 Unspecified street and highway as the place of occurrence of the external cause: Secondary | ICD-10-CM | POA: Diagnosis not present

## 2022-03-24 DIAGNOSIS — Z79899 Other long term (current) drug therapy: Secondary | ICD-10-CM | POA: Diagnosis not present

## 2022-03-24 DIAGNOSIS — S8002XA Contusion of left knee, initial encounter: Secondary | ICD-10-CM | POA: Diagnosis not present

## 2022-03-24 DIAGNOSIS — S134XXA Sprain of ligaments of cervical spine, initial encounter: Secondary | ICD-10-CM | POA: Diagnosis not present

## 2022-03-24 DIAGNOSIS — S199XXA Unspecified injury of neck, initial encounter: Secondary | ICD-10-CM | POA: Diagnosis present

## 2022-03-24 DIAGNOSIS — D7589 Other specified diseases of blood and blood-forming organs: Secondary | ICD-10-CM | POA: Diagnosis not present

## 2022-03-24 DIAGNOSIS — S9001XA Contusion of right ankle, initial encounter: Secondary | ICD-10-CM | POA: Diagnosis not present

## 2022-03-24 DIAGNOSIS — S20219A Contusion of unspecified front wall of thorax, initial encounter: Secondary | ICD-10-CM | POA: Insufficient documentation

## 2022-03-24 DIAGNOSIS — S139XXA Sprain of joints and ligaments of unspecified parts of neck, initial encounter: Secondary | ICD-10-CM
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
## 2022-03-25 ENCOUNTER — Emergency Department (HOSPITAL_COMMUNITY): Payer: Self-pay

## 2022-03-25 LAB — BASIC METABOLIC PANEL
Anion gap: 11 (ref 5–15)
BUN: 17 mg/dL (ref 6–20)
CO2: 21 mmol/L — ABNORMAL LOW (ref 22–32)
Calcium: 9.2 mg/dL (ref 8.9–10.3)
Chloride: 102 mmol/L (ref 98–111)
Creatinine, Ser: 1.17 mg/dL (ref 0.61–1.24)
GFR, Estimated: >60 mL/min (ref >60)
Glucose, Bld: 106 mg/dL — ABNORMAL HIGH (ref 70–99)
Potassium: 3.8 mmol/L (ref 3.5–5.1)
Sodium: 134 mmol/L — ABNORMAL LOW (ref 135–145)

## 2022-03-25 LAB — CBC
HCT: 40.8 % (ref 39.0–52.0)
Hemoglobin: 14.1 g/dL (ref 13.0–17.0)
MCH: 34.9 pg — ABNORMAL HIGH (ref 26.0–34.0)
MCHC: 34.6 g/dL (ref 30.0–36.0)
MCV: 101 fL — ABNORMAL HIGH (ref 80.0–100.0)
Platelets: 238 10*3/uL (ref 150–400)
RBC: 4.04 MIL/uL — ABNORMAL LOW (ref 4.22–5.81)
RDW: 13.2 % (ref 11.5–15.5)
WBC: 5.4 10*3/uL (ref 4.0–10.5)
nRBC: 0 % (ref 0.0–0.2)

## 2022-03-25 LAB — ETHANOL: Alcohol, Ethyl (B): 37 mg/dL — ABNORMAL HIGH (ref <10)

## 2022-03-25 MED ORDER — IOHEXOL 300 MG/ML  SOLN
100.0000 mL | Freq: Once | INTRAMUSCULAR | Status: AC | PRN
  Administered 2022-03-25: 100 mL via INTRAVENOUS

## 2022-03-25 MED ORDER — MORPHINE SULFATE (PF) 4 MG/ML IV SOLN
4.0000 mg | Freq: Once | INTRAVENOUS | Status: AC
  Administered 2022-03-25: 4 mg via INTRAVENOUS
  Filled 2022-03-25: qty 1

## 2022-03-25 NOTE — Discharge Instructions (Signed)
Apply ice for 30 minutes at a time, 4 times a day.  Take ibuprofen or naproxen as needed for pain.  If you need additional pain relief, add acetaminophen.  Please be aware that when you combine acetaminophen with either ibuprofen or naproxen, you get better pain relief and you get from taking either medication by itself.
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
## 2022-03-31 ENCOUNTER — Encounter (HOSPITAL_COMMUNITY): Payer: Self-pay | Admitting: Internal Medicine

## 2022-03-31 ENCOUNTER — Emergency Department (HOSPITAL_COMMUNITY): Payer: Self-pay

## 2022-03-31 ENCOUNTER — Other Ambulatory Visit: Payer: Self-pay

## 2022-03-31 ENCOUNTER — Inpatient Hospital Stay (HOSPITAL_COMMUNITY)
Admission: EM | Admit: 2022-03-31 | Discharge: 2022-04-06 | DRG: 388 | Disposition: A | Discharge status: Home / Self Care | Payer: Self-pay | Attending: Internal Medicine | Admitting: Internal Medicine

## 2022-03-31 ENCOUNTER — Inpatient Hospital Stay (HOSPITAL_COMMUNITY): Payer: Self-pay

## 2022-03-31 DIAGNOSIS — F17210 Nicotine dependence, cigarettes, uncomplicated: Secondary | ICD-10-CM | POA: Diagnosis present

## 2022-03-31 DIAGNOSIS — Z91014 Allergy to mammalian meats: Secondary | ICD-10-CM

## 2022-03-31 DIAGNOSIS — Z6821 Body mass index (BMI) 21.0-21.9, adult: Secondary | ICD-10-CM | POA: Diagnosis not present

## 2022-03-31 DIAGNOSIS — J302 Other seasonal allergic rhinitis: Secondary | ICD-10-CM | POA: Diagnosis present

## 2022-03-31 DIAGNOSIS — K56609 Unspecified intestinal obstruction, unspecified as to partial versus complete obstruction: Secondary | ICD-10-CM | POA: Diagnosis present

## 2022-03-31 DIAGNOSIS — Z905 Acquired absence of kidney: Secondary | ICD-10-CM

## 2022-03-31 DIAGNOSIS — Z8719 Personal history of other diseases of the digestive system: Secondary | ICD-10-CM | POA: Diagnosis not present

## 2022-03-31 DIAGNOSIS — Z888 Allergy status to other drugs, medicaments and biological substances status: Secondary | ICD-10-CM | POA: Diagnosis not present

## 2022-03-31 DIAGNOSIS — Z91048 Other nonmedicinal substance allergy status: Secondary | ICD-10-CM

## 2022-03-31 DIAGNOSIS — Z933 Colostomy status: Secondary | ICD-10-CM | POA: Diagnosis not present

## 2022-03-31 DIAGNOSIS — Z9049 Acquired absence of other specified parts of digestive tract: Secondary | ICD-10-CM

## 2022-03-31 DIAGNOSIS — E43 Unspecified severe protein-calorie malnutrition: Secondary | ICD-10-CM | POA: Diagnosis present

## 2022-03-31 DIAGNOSIS — Z880 Allergy status to penicillin: Secondary | ICD-10-CM | POA: Diagnosis not present

## 2022-03-31 DIAGNOSIS — Z91018 Allergy to other foods: Secondary | ICD-10-CM

## 2022-03-31 DIAGNOSIS — G40909 Epilepsy, unspecified, not intractable, without status epilepticus: Secondary | ICD-10-CM | POA: Diagnosis present

## 2022-03-31 DIAGNOSIS — K56600 Partial intestinal obstruction, unspecified as to cause: Principal | ICD-10-CM | POA: Diagnosis present

## 2022-03-31 DIAGNOSIS — R682 Dry mouth, unspecified: Secondary | ICD-10-CM | POA: Diagnosis present

## 2022-03-31 DIAGNOSIS — J439 Emphysema, unspecified: Secondary | ICD-10-CM | POA: Diagnosis present

## 2022-03-31 DIAGNOSIS — N179 Acute kidney failure, unspecified: Secondary | ICD-10-CM | POA: Diagnosis present

## 2022-03-31 DIAGNOSIS — Z79899 Other long term (current) drug therapy: Secondary | ICD-10-CM

## 2022-03-31 DIAGNOSIS — M25572 Pain in left ankle and joints of left foot: Secondary | ICD-10-CM | POA: Diagnosis present

## 2022-03-31 DIAGNOSIS — J449 Chronic obstructive pulmonary disease, unspecified: Secondary | ICD-10-CM | POA: Diagnosis present

## 2022-03-31 DIAGNOSIS — K7010 Alcoholic hepatitis without ascites: Secondary | ICD-10-CM

## 2022-03-31 LAB — URINALYSIS, ROUTINE W REFLEX MICROSCOPIC
Bacteria, UA: NONE SEEN
Bilirubin Urine: NEGATIVE
Glucose, UA: NEGATIVE mg/dL
Hgb urine dipstick: NEGATIVE
Ketones, ur: 20 mg/dL — AB
Leukocytes,Ua: NEGATIVE
Nitrite: NEGATIVE
Protein, ur: 100 mg/dL — AB
Specific Gravity, Urine: 1.033 — ABNORMAL HIGH (ref 1.005–1.030)
pH: 7 (ref 5.0–8.0)

## 2022-03-31 LAB — CBC
HCT: 45.8 % (ref 39.0–52.0)
Hemoglobin: 15.2 g/dL (ref 13.0–17.0)
MCH: 33.9 pg (ref 26.0–34.0)
MCHC: 33.2 g/dL (ref 30.0–36.0)
MCV: 102 fL — ABNORMAL HIGH (ref 80.0–100.0)
Platelets: 315 10*3/uL (ref 150–400)
RBC: 4.49 MIL/uL (ref 4.22–5.81)
RDW: 13.8 % (ref 11.5–15.5)
WBC: 8.3 10*3/uL (ref 4.0–10.5)
nRBC: 0 % (ref 0.0–0.2)

## 2022-03-31 LAB — COMPREHENSIVE METABOLIC PANEL
ALT: 19 U/L (ref 0–44)
AST: 27 U/L (ref 15–41)
Albumin: 3.9 g/dL (ref 3.5–5.0)
Alkaline Phosphatase: 75 U/L (ref 38–126)
Anion gap: 16 — ABNORMAL HIGH (ref 5–15)
BUN: 14 mg/dL (ref 6–20)
CO2: 38 mmol/L — ABNORMAL HIGH (ref 22–32)
Calcium: 9.6 mg/dL (ref 8.9–10.3)
Chloride: 87 mmol/L — ABNORMAL LOW (ref 98–111)
Creatinine, Ser: 1.3 mg/dL — ABNORMAL HIGH (ref 0.61–1.24)
GFR, Estimated: >60 mL/min (ref >60)
Glucose, Bld: 114 mg/dL — ABNORMAL HIGH (ref 70–99)
Potassium: 3.7 mmol/L (ref 3.5–5.1)
Sodium: 141 mmol/L (ref 135–145)
Total Bilirubin: 1.1 mg/dL (ref 0.3–1.2)
Total Protein: 7.3 g/dL (ref 6.5–8.1)

## 2022-03-31 LAB — LIPASE, BLOOD: Lipase: 24 U/L (ref 11–51)

## 2022-03-31 MED ORDER — ONDANSETRON HCL 4 MG/2ML IJ SOLN
4.0000 mg | Freq: Once | INTRAMUSCULAR | Status: AC
  Administered 2022-03-31: 4 mg via INTRAVENOUS
  Filled 2022-03-31: qty 2

## 2022-03-31 MED ORDER — CHLORHEXIDINE GLUCONATE 0.12 % MT SOLN
15.0000 mL | Freq: Two times a day (BID) | OROMUCOSAL | Status: DC
  Administered 2022-04-01 – 2022-04-05 (×10): 15 mL via OROMUCOSAL
  Filled 2022-03-31 (×10): qty 15

## 2022-03-31 MED ORDER — ONDANSETRON 4 MG PO TBDP
4.0000 mg | ORAL_TABLET | Freq: Once | ORAL | Status: AC
  Administered 2022-03-31: 4 mg via ORAL
  Filled 2022-03-31: qty 1

## 2022-03-31 MED ORDER — ALBUTEROL SULFATE (2.5 MG/3ML) 0.083% IN NEBU
2.5000 mg | INHALATION_SOLUTION | Freq: Four times a day (QID) | RESPIRATORY_TRACT | Status: DC | PRN
Start: 2022-03-31 — End: 2022-04-06

## 2022-03-31 MED ORDER — ORAL CARE MOUTH RINSE
15.0000 mL | Freq: Two times a day (BID) | OROMUCOSAL | Status: DC
  Administered 2022-04-01 – 2022-04-04 (×4): 15 mL via OROMUCOSAL

## 2022-03-31 MED ORDER — ONDANSETRON HCL 4 MG/2ML IJ SOLN: 4.0000 mg | Freq: Four times a day (QID) | INTRAMUSCULAR | Status: DC | PRN

## 2022-03-31 MED ORDER — HYDROMORPHONE HCL 1 MG/ML IJ SOLN
0.5000 mg | INTRAMUSCULAR | Status: DC | PRN
  Administered 2022-03-31 – 2022-04-06 (×32): 1 mg via INTRAVENOUS
  Filled 2022-03-31 (×33): qty 1

## 2022-03-31 MED ORDER — ALBUTEROL SULFATE HFA 108 (90 BASE) MCG/ACT IN AERS: 2.0000 | INHALATION_SPRAY | Freq: Four times a day (QID) | RESPIRATORY_TRACT | Status: DC | PRN

## 2022-03-31 MED ORDER — SODIUM CHLORIDE 0.9 % IV BOLUS
1000.0000 mL | Freq: Once | INTRAVENOUS | Status: AC
  Administered 2022-03-31: 1000 mL via INTRAVENOUS

## 2022-03-31 MED ORDER — ONDANSETRON HCL 4 MG PO TABS: 4.0000 mg | ORAL_TABLET | Freq: Four times a day (QID) | ORAL | Status: DC | PRN

## 2022-03-31 MED ORDER — FENTANYL CITRATE PF 50 MCG/ML IJ SOSY
50.0000 ug | PREFILLED_SYRINGE | Freq: Once | INTRAMUSCULAR | Status: AC
  Administered 2022-03-31: 50 ug via INTRAVENOUS
  Filled 2022-03-31: qty 1

## 2022-03-31 MED ORDER — POTASSIUM CHLORIDE IN NACL 40-0.9 MEQ/L-% IV SOLN
INTRAVENOUS | Status: DC
  Filled 2022-03-31 (×4): qty 1000

## 2022-03-31 MED ORDER — IOHEXOL 300 MG/ML  SOLN
100.0000 mL | Freq: Once | INTRAMUSCULAR | Status: AC | PRN
Start: 2022-03-31 — End: 2022-03-31
  Administered 2022-03-31: 100 mL via INTRAVENOUS
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
## 2022-03-31 NOTE — ED Notes (Signed)
Pt transported to radiology.
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
## 2022-03-31 NOTE — ED Triage Notes (Signed)
EMS stated, he has had upper gastric pain for the last 2 days with N/V .

## 2022-03-31 NOTE — Progress Notes (Signed)
Pt arrived to 6N31 per stretcher from ED. Alert and oriented to person, place, time and situation. Pt on 2L [place]. Vitals signs taken. SCDs placed. Pain assessed. Skin assessment done with another RN. Pt oriented on safety procedures and how to ask for help. Call bell and room phone within reach. Will continue to monitor pt.

## 2022-03-31 NOTE — ED Notes (Signed)
Pt moved to room 38 at this time 

## 2022-03-31 NOTE — ED Notes (Signed)
Received verbal report from Irene Limbo RN at this time

## 2022-04-01 LAB — COMPREHENSIVE METABOLIC PANEL
ALT: 16 U/L (ref 0–44)
AST: 20 U/L (ref 15–41)
Albumin: 3.2 g/dL — ABNORMAL LOW (ref 3.5–5.0)
Alkaline Phosphatase: 58 U/L (ref 38–126)
Anion gap: 7 (ref 5–15)
BUN: 19 mg/dL (ref 6–20)
CO2: 36 mmol/L — ABNORMAL HIGH (ref 22–32)
Calcium: 8.5 mg/dL — ABNORMAL LOW (ref 8.9–10.3)
Chloride: 97 mmol/L — ABNORMAL LOW (ref 98–111)
Creatinine, Ser: 1.28 mg/dL — ABNORMAL HIGH (ref 0.61–1.24)
GFR, Estimated: >60 mL/min (ref >60)
Glucose, Bld: 92 mg/dL (ref 70–99)
Potassium: 4 mmol/L (ref 3.5–5.1)
Sodium: 140 mmol/L (ref 135–145)
Total Bilirubin: 0.9 mg/dL (ref 0.3–1.2)
Total Protein: 5.9 g/dL — ABNORMAL LOW (ref 6.5–8.1)

## 2022-04-01 LAB — HIV ANTIBODY (ROUTINE TESTING W REFLEX): HIV Screen 4th Generation wRfx: NONREACTIVE

## 2022-04-01 LAB — CBC
HCT: 41.1 % (ref 39.0–52.0)
Hemoglobin: 13.3 g/dL (ref 13.0–17.0)
MCH: 33.7 pg (ref 26.0–34.0)
MCHC: 32.4 g/dL (ref 30.0–36.0)
MCV: 104.1 fL — ABNORMAL HIGH (ref 80.0–100.0)
Platelets: 243 10*3/uL (ref 150–400)
RBC: 3.95 MIL/uL — ABNORMAL LOW (ref 4.22–5.81)
RDW: 13.7 % (ref 11.5–15.5)
WBC: 5.1 10*3/uL (ref 4.0–10.5)
nRBC: 0 % (ref 0.0–0.2)

## 2022-04-01 LAB — PROTIME-INR
INR: 1 (ref 0.8–1.2)
Prothrombin Time: 12.7 seconds (ref 11.4–15.2)

## 2022-04-01 MED ORDER — SODIUM CHLORIDE 0.9 % IV BOLUS
500.0000 mL | Freq: Once | INTRAVENOUS | Status: AC
  Administered 2022-04-01: 500 mL via INTRAVENOUS
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
## 2022-04-02 ENCOUNTER — Inpatient Hospital Stay (HOSPITAL_COMMUNITY): Payer: Self-pay

## 2022-04-02 LAB — CBC
HCT: 38.7 % — ABNORMAL LOW (ref 39.0–52.0)
Hemoglobin: 12.1 g/dL — ABNORMAL LOW (ref 13.0–17.0)
MCH: 33.7 pg (ref 26.0–34.0)
MCHC: 31.3 g/dL (ref 30.0–36.0)
MCV: 107.8 fL — ABNORMAL HIGH (ref 80.0–100.0)
Platelets: 205 10*3/uL (ref 150–400)
RBC: 3.59 MIL/uL — ABNORMAL LOW (ref 4.22–5.81)
RDW: 13.2 % (ref 11.5–15.5)
WBC: 3.8 10*3/uL — ABNORMAL LOW (ref 4.0–10.5)
nRBC: 0 % (ref 0.0–0.2)

## 2022-04-02 LAB — BASIC METABOLIC PANEL
Anion gap: 7 (ref 5–15)
BUN: 14 mg/dL (ref 6–20)
CO2: 29 mmol/L (ref 22–32)
Calcium: 8.5 mg/dL — ABNORMAL LOW (ref 8.9–10.3)
Chloride: 104 mmol/L (ref 98–111)
Creatinine, Ser: 0.97 mg/dL (ref 0.61–1.24)
GFR, Estimated: >60 mL/min (ref >60)
Glucose, Bld: 81 mg/dL (ref 70–99)
Potassium: 4.4 mmol/L (ref 3.5–5.1)
Sodium: 140 mmol/L (ref 135–145)

## 2022-04-02 MED ORDER — PROSOURCE PLUS PO LIQD
30.0000 mL | Freq: Two times a day (BID) | ORAL | Status: DC
  Administered 2022-04-05 – 2022-04-06 (×2): 30 mL via ORAL
  Filled 2022-04-02 (×3): qty 30

## 2022-04-02 MED ORDER — BOOST / RESOURCE BREEZE PO LIQD CUSTOM
1.0000 | Freq: Three times a day (TID) | ORAL | Status: DC
  Administered 2022-04-02 – 2022-04-06 (×4): 1 via ORAL

## 2022-04-02 MED ORDER — KETOROLAC TROMETHAMINE 15 MG/ML IJ SOLN
7.5000 mg | Freq: Once | INTRAMUSCULAR | Status: AC
  Administered 2022-04-02: 7.5 mg via INTRAVENOUS
  Filled 2022-04-02: qty 1

## 2022-04-02 MED ORDER — POTASSIUM CHLORIDE IN NACL 40-0.9 MEQ/L-% IV SOLN
INTRAVENOUS | Status: AC
  Filled 2022-04-02 (×2): qty 1000

## 2022-04-02 NOTE — Progress Notes (Signed)
Mobility Specialist Progress Note:   04/02/22 1620  Mobility  Activity Ambulated with assistance in hallway  Level of Assistance Contact guard assist, steadying assist  York wheel walker;Other (Comment) (IV Pole)  Distance Ambulated (ft) 550 ft  Activity Response Tolerated well  $Mobility charge 1 Mobility   Pt eager for second mobility session. Started with RW, pt observed to be holding RW off the floor while ambulating. Transitioned to IV Pole, pt with no unsteadiness or LOB. Pt antalgic gait improved as distance increased, however still endorsing moderate pain in R ankle. Pt back in bed with all needs met.   Nelta Numbers Acute Rehab Secure Chat or Office Phone: [phone]
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
## 2022-04-02 NOTE — TOC Initial Note (Signed)
Transition of Care [facility]) - Initial/Assessment Note    Patient Details  Name: David Benson MRN: [id number] Date of Birth: [birth year]  [facility] CM/SW Contact:    Kingsley Plan, RN Phone Number: 04/02/2022, 2:23 PM  Clinical Narrative:                  From home with wife.   Patient states he has medicaid , does not have card with him. Patient called wife , Medicaid number [id number] S . NCM called admitting and provided number.   Patient does not have a PCP.  Patient and wife would like him to establish care at [facility] [phone].  NCM called spoke with Amy they have first available April 23, 2022 at 3:15 pm, provided Medicaid number.   Discussed OP PT, patient would like [address]. NCM placed referral will need to establish care prior to first visit. Patient and wife aware.   Ordered walker with [employer] provided Medicaid number   Received consult from a nurse to assist with medications. Patient has medicaid scripts co pay around $3 cannot assist with co pays. Expected Discharge Plan: Home/Self Care Barriers to Discharge: Continued Medical Work up   Patient Goals and CMS Choice Patient states their goals for this hospitalization and ongoing recovery are:: to return to home CMS Medicare.gov Compare Post Acute Care list provided to:: Patient Choice offered to / list presented to : Patient  Expected Discharge Plan and Services Expected Discharge Plan: Home/Self Care   Discharge Planning Services: CM Consult Post Acute Care Choice:  (OP PT) Living arrangements for the past 2 months: Apartment                   DME Agency: NA       HH Arranged: NA          Prior Living Arrangements/Services Living arrangements for the past 2 months: Apartment Lives with:: Spouse Patient language and need for interpreter reviewed:: Yes Do you feel safe going back to the place where you live?: Yes      Need for Family  Participation in Patient Care: Yes (Comment) Care giver support system in place?: Yes (comment)   Criminal Activity/Legal Involvement Pertinent to Current Situation/Hospitalization: No - Comment as needed  Activities of Daily Living Home Assistive Devices/Equipment: None ADL Screening (condition at time of admission) Patient's cognitive ability adequate to safely complete daily activities?: Yes Is the patient deaf or have difficulty hearing?: No Does the patient have difficulty seeing, even when wearing glasses/contacts?: No Does the patient have difficulty concentrating, remembering, or making decisions?: No Patient able to express need for assistance with ADLs?: Yes Does the patient have difficulty dressing or bathing?: No Independently performs ADLs?: Yes (appropriate for developmental age) Does the patient have difficulty walking or climbing stairs?: No Weakness of Legs: None Weakness of Arms/Hands: None  Permission Sought/Granted      Share Information with NAME: Lawanna Kobus wife           Emotional Assessment Appearance:: Appears stated age Attitude/Demeanor/Rapport: Engaged Affect (typically observed): Accepting Orientation: : Oriented to Self, Oriented to Place, Oriented to  Time, Oriented to Situation Alcohol / Substance Use: Not Applicable Psych Involvement: No (comment)  Admission diagnosis:  Small bowel obstruction (HCC) [K56.609] SBO (small bowel obstruction) (HCC) [K56.609] Patient Active Problem List   Diagnosis Date Noted   Small bowel obstruction (HCC) 03/19/2020   Aspiration pneumonia (HCC) 02/20/2019  Lactic acid acidosis 02/20/2019   Sepsis, Gram negative (HCC) 02/20/2019   AKI (acute kidney injury) (HCC) 02/20/2019   Alcoholic hepatitis without ascites 02/20/2019   Post-ictal state (HCC) 10/06/2018   SBO (small bowel obstruction) (HCC) 08/10/2016   Polysubstance abuse (HCC) 08/10/2016   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 [place] [address] Phone: [phone] Fax: [phone]  [facility] [address] ([place]), [place] - [address] [place] [address] Phone: [phone] Fax: [phone]     Social Determinants of Health (SDOH) Interventions    Readmission Risk Interventions    07/05/2020   11:50 AM  Readmission Risk Prevention Plan  Transportation Screening Complete  Medication Review (RN Care Manager) Referral to Pharmacy  PCP or Specialist appointment within 3-5 days of discharge Complete  HRI or Home Care Consult Complete  SW Recovery Care/Counseling Consult Complete  Palliative Care Screening Not Applicable  Skilled Nursing Facility Not Applicable
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
## 2022-04-03 ENCOUNTER — Inpatient Hospital Stay (HOSPITAL_COMMUNITY): Payer: Self-pay

## 2022-04-03 DIAGNOSIS — E43 Unspecified severe protein-calorie malnutrition: Secondary | ICD-10-CM | POA: Insufficient documentation

## 2022-04-03 LAB — BASIC METABOLIC PANEL
Anion gap: 5 (ref 5–15)
BUN: 10 mg/dL (ref 6–20)
CO2: 32 mmol/L (ref 22–32)
Calcium: 8.6 mg/dL — ABNORMAL LOW (ref 8.9–10.3)
Chloride: 102 mmol/L (ref 98–111)
Creatinine, Ser: 1.05 mg/dL (ref 0.61–1.24)
GFR, Estimated: >60 mL/min (ref >60)
Glucose, Bld: 98 mg/dL (ref 70–99)
Potassium: 4.4 mmol/L (ref 3.5–5.1)
Sodium: 139 mmol/L (ref 135–145)

## 2022-04-03 LAB — CBC
HCT: 38.3 % — ABNORMAL LOW (ref 39.0–52.0)
Hemoglobin: 12.5 g/dL — ABNORMAL LOW (ref 13.0–17.0)
MCH: 34.3 pg — ABNORMAL HIGH (ref 26.0–34.0)
MCHC: 32.6 g/dL (ref 30.0–36.0)
MCV: 105.2 fL — ABNORMAL HIGH (ref 80.0–100.0)
Platelets: 204 10*3/uL (ref 150–400)
RBC: 3.64 MIL/uL — ABNORMAL LOW (ref 4.22–5.81)
RDW: 12.9 % (ref 11.5–15.5)
WBC: 6.1 10*3/uL (ref 4.0–10.5)
nRBC: 0 % (ref 0.0–0.2)

## 2022-04-03 MED ORDER — LORAZEPAM 2 MG/ML IJ SOLN: 1.0000 mg | Freq: Once | INTRAMUSCULAR | Status: DC | PRN

## 2022-04-03 MED ORDER — DIATRIZOATE MEGLUMINE & SODIUM 66-10 % PO SOLN
90.0000 mL | Freq: Once | ORAL | Status: AC
  Administered 2022-04-03: 90 mL via NASOGASTRIC
  Filled 2022-04-03: qty 90

## 2022-04-03 MED ORDER — SODIUM CHLORIDE 0.9 % IV SOLN: INTRAVENOUS | Status: AC

## 2022-04-03 NOTE — Progress Notes (Signed)
NG tube placed. X ray confirmed placement. Gastrografin administered at 1354.

## 2022-04-03 NOTE — Progress Notes (Signed)
Mobility Specialist Progress Note:   04/03/22 0955  Mobility  Activity Ambulated with assistance in hallway  Level of Assistance Standby assist, set-up cues, supervision of patient - no hands on  Assistive Device Other (Comment) (IV pole)  Distance Ambulated (ft) 550 ft  Activity Response Tolerated well  $Mobility charge 1 Mobility   Pt eager for mobility session. Required min guard assist throughout for safety. Pt with decreased antalgic gait this am. States he is "trying to ignore the pain". No c/o throughout session. Pt back in bed with all needs met.   Nelta Numbers Acute Rehab Secure Chat or Office Phone: [phone]
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
## 2022-04-04 ENCOUNTER — Inpatient Hospital Stay (HOSPITAL_COMMUNITY): Payer: Self-pay

## 2022-04-04 DIAGNOSIS — E43 Unspecified severe protein-calorie malnutrition: Secondary | ICD-10-CM

## 2022-04-04 DIAGNOSIS — N179 Acute kidney failure, unspecified: Secondary | ICD-10-CM

## 2022-04-04 LAB — BASIC METABOLIC PANEL
Anion gap: 8 (ref 5–15)
BUN: 8 mg/dL (ref 6–20)
CO2: 30 mmol/L (ref 22–32)
Calcium: 8.5 mg/dL — ABNORMAL LOW (ref 8.9–10.3)
Chloride: 102 mmol/L (ref 98–111)
Creatinine, Ser: 0.98 mg/dL (ref 0.61–1.24)
GFR, Estimated: >60 mL/min (ref >60)
Glucose, Bld: 85 mg/dL (ref 70–99)
Potassium: 4 mmol/L (ref 3.5–5.1)
Sodium: 140 mmol/L (ref 135–145)

## 2022-04-04 LAB — CBC
HCT: 38.9 % — ABNORMAL LOW (ref 39.0–52.0)
Hemoglobin: 12.7 g/dL — ABNORMAL LOW (ref 13.0–17.0)
MCH: 33.2 pg (ref 26.0–34.0)
MCHC: 32.6 g/dL (ref 30.0–36.0)
MCV: 101.8 fL — ABNORMAL HIGH (ref 80.0–100.0)
Platelets: 210 10*3/uL (ref 150–400)
RBC: 3.82 MIL/uL — ABNORMAL LOW (ref 4.22–5.81)
RDW: 12.9 % (ref 11.5–15.5)
WBC: 4.1 10*3/uL (ref 4.0–10.5)
nRBC: 0 % (ref 0.0–0.2)
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
## 2022-04-04 NOTE — Progress Notes (Signed)
Brief Nutrition Note  Noted pt downgraded from clear liquids to NPO 6/13 due to increased bloating. NGT placed and SBO protocol initiated by surgery team. XR from this AM shows contrast has progressed to the proximal jejunum, none in the large bowel.   ~1L of output from the NGT in the last 24 hours.   Discussed the possibility of initiation of TPN as pt is malnourished and will not be able to meet his needs orally for several more days. Surgery team would like to allow for 24 more hours to determine if pt will progress.   Noted that surgery may be needed if pt does not improve with current management. Pt would benefit from initiation of nutrition support prior to surgery if this is necessary to support healing and recovery.  Ranell Patrick, RD, LDN Clinical Dietitian RD pager # available in [place]  After hours/weekend pager # available in [facility]
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
## 2022-04-05 ENCOUNTER — Inpatient Hospital Stay (HOSPITAL_COMMUNITY): Payer: Self-pay

## 2022-04-05 NOTE — Progress Notes (Signed)
Mobility Specialist Progress Note:   04/05/22 0915  Mobility  Activity Ambulated independently in hallway  Level of Assistance Independent after set-up  Assistive Device None  Distance Ambulated (ft) 1100 ft  Activity Response Tolerated well  $Mobility charge 1 Mobility   Pt received ambulating independently in hallway. Only c/o minor pain in R ankle, otherwise asx. Pt left to continue ambulating in hallway.   Addison Lank [facility] Secure Chat or Office Phone: [phone]

## 2022-04-05 NOTE — Progress Notes (Signed)
Mobility Specialist Progress Note:   04/05/22 1115  Mobility  Activity Ambulated independently in hallway  Level of Assistance Independent  Assistive Device None  Distance Ambulated (ft) 1100 ft  Activity Response Tolerated well  $Mobility charge 1 Mobility   Pt received ambulating in hallway independently. No c/o pain throughout.   Addison Lank [facility] Secure Chat or Office Phone: [phone]
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
## 2022-04-05 NOTE — Progress Notes (Signed)
Physical Therapy Discharge Patient Details Name: David Benson MRN: [id number] DOB: 1966-01-14 Today's Date: 04/05/2022 Time:  -     Patient discharged from PT services secondary to goals met and no further PT needs identified.  Please see latest therapy progress note for current level of functioning and progress toward goals.    Progress and discharge plan discussed with patient and/or caregiver: Patient/Caregiver agrees with plan.  Pt is walking on the hallway with I function with no AD, no complaints of pain on R ankle.  Follow up with nursing and mobility techs as previously, PT discharge due to completion of mobility goals.  No assistive device needed.  GP     Ramond Dial 04/05/2022, 12:00 PM   Mee Hives, PT PhD Acute Rehab Dept. Number: [place] and [place]
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
## 2022-04-06 ENCOUNTER — Other Ambulatory Visit (HOSPITAL_COMMUNITY): Payer: Self-pay

## 2022-04-06 DIAGNOSIS — J449 Chronic obstructive pulmonary disease, unspecified: Secondary | ICD-10-CM

## 2022-04-06 DIAGNOSIS — G40909 Epilepsy, unspecified, not intractable, without status epilepticus: Secondary | ICD-10-CM

## 2022-04-06 MED ORDER — ORAL CARE MOUTH RINSE: 15.0000 mL | OROMUCOSAL | Status: DC | PRN

## 2022-04-06 NOTE — Progress Notes (Signed)
Pt discharged to home with rolling walker equipment and DC instructions. No further questions verbalized about home self care.

## 2022-04-06 NOTE — Progress Notes (Signed)
Patient given discharge instructions but is waiting to eat his lunch per MD before leaving.
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
## 2022-04-06 NOTE — Discharge Summary (Signed)
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 03/31/2022 1009   PHURINE 7.0 03/31/2022 1009   GLUCOSEU NEGATIVE 03/31/2022 1009   HGBUR NEGATIVE 03/31/2022 1009   BILIRUBINUR NEGATIVE 03/31/2022 1009   KETONESUR 20 (A) 03/31/2022 1009   PROTEINUR 100 (A) 03/31/2022 1009   UROBILINOGEN 0.2 03/04/2018 1216   NITRITE NEGATIVE 03/31/2022 1009   LEUKOCYTESUR NEGATIVE 03/31/2022 1009   Sepsis Labs Recent Labs  Lab 04/01/22 0135 04/02/22 0646 04/03/22 0025 04/04/22 0007  WBC 5.1 3.8* 6.1 4.1   Microbiology No results found for this or any previous visit (from the past 240 hour(s)).  SIGNED:   Charlynne Cousins, MD  [facility] 04/06/2022, 11:16 AM Pager   If 7PM-7AM, please contact night-coverage [email or web address] Password [email or web address]

## 2022-04-06 NOTE — Plan of Care (Signed)
  Problem: Education: Goal: Knowledge of General Education information will improve Description: Including pain rating scale, medication(s)/side effects and non-pharmacologic comfort measures Outcome: Adequate for Discharge   Problem: Health Behavior/Discharge Planning: Goal: Ability to manage health-related needs will improve Outcome: Adequate for Discharge   Problem: Clinical Measurements: Goal: Ability to maintain clinical measurements within normal limits will improve Outcome: Adequate for Discharge Goal: Will remain free from infection Outcome: Adequate for Discharge Goal: Diagnostic test results will improve Outcome: Adequate for Discharge Goal: Respiratory complications will improve Outcome: Adequate for Discharge Goal: Cardiovascular complication will be avoided Outcome: Adequate for Discharge   Problem: Activity: Goal: Risk for activity intolerance will decrease Outcome: Adequate for Discharge   Problem: Nutrition: Goal: Adequate nutrition will be maintained Outcome: Adequate for Discharge   Problem: Coping: Goal: Level of anxiety will decrease Outcome: Adequate for Discharge   Problem: Elimination: Goal: Will not experience complications related to bowel motility Outcome: Adequate for Discharge Goal: Will not experience complications related to urinary retention Outcome: Adequate for Discharge   Problem: Pain Managment: Goal: General experience of comfort will improve Outcome: Adequate for Discharge   Problem: Safety: Goal: Ability to remain free from injury will improve Outcome: Adequate for Discharge   Problem: Skin Integrity: Goal: Risk for impaired skin integrity will decrease Outcome: Adequate for Discharge   Problem: Acute Rehab PT Goals(only PT should resolve) Goal: Pt Will Go Supine/Side To Sit Outcome: Adequate for Discharge Goal: Patient Will Transfer Sit To/From Stand Outcome: Adequate for Discharge Goal: Pt Will Ambulate Outcome: Adequate  for Discharge   Problem: Malnutrition  (NI-5.2) Goal: Food and/or nutrient delivery Description: Individualized approach for food/nutrient provision. Outcome: Adequate for Discharge   

## 2022-04-06 NOTE — TOC Progression Note (Signed)
Transition of Care [facility]) - Progression Note    Patient Details  Name: COLAN LAYMON MRN: [id number] Date of Birth: 1966/04/17  Transition of [facility]) CM/SW Contact  Nadene Rubins Adria Devon, RN Phone Number: 04/06/2022, 10:12 AM  Clinical Narrative:     Patient for possible discharge today . Patient states he has Medicaid , Medicaid number [id number] S , NCM called admitting earlier in the week. NCM secure chatted [facility] pharmacy Medicaid number [id number] S   Expected Discharge Plan: Home/Self Care Barriers to Discharge: Continued Medical Work up  Expected Discharge Plan and Services Expected Discharge Plan: Home/Self Care   Discharge Planning Services: CM Consult Post Acute Care Choice:  (OP PT) Living arrangements for the past 2 months: Apartment                   DME Agency: NA       HH Arranged: NA           Social Determinants of Health (SDOH) Interventions    Readmission Risk Interventions    07/05/2020   11:50 AM  Readmission Risk Prevention Plan  Transportation Screening Complete  Medication Review (RN Care Manager) Referral to Pharmacy  PCP or Specialist appointment within 3-5 days of discharge Complete  HRI or Home Care Consult Complete  SW Recovery Care/Counseling Consult Complete  Palliative Care Screening Not Applicable  Skilled Nursing Facility Not Applicable

## 2022-04-23 ENCOUNTER — Ambulatory Visit (INDEPENDENT_AMBULATORY_CARE_PROVIDER_SITE_OTHER): Payer: Self-pay | Admitting: Student

## 2022-04-23 ENCOUNTER — Encounter: Payer: Self-pay | Admitting: Student

## 2022-04-23 VITALS — BP 118/80 | HR 74 | Ht 69.0 in | Wt 155.0 lb

## 2022-04-23 DIAGNOSIS — Z8639 Personal history of other endocrine, nutritional and metabolic disease: Secondary | ICD-10-CM

## 2022-04-23 DIAGNOSIS — Z7689 Persons encountering health services in other specified circumstances: Secondary | ICD-10-CM

## 2022-04-23 LAB — POCT GLYCOSYLATED HEMOGLOBIN (HGB A1C): Hemoglobin A1C: 6 % — AB (ref 4.0–5.6)
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
## 2022-04-23 NOTE — Patient Instructions (Signed)
[name],  It is so good to meet you today! I am going to enjoy being your PCP.   We are checking some labs today. I will call you if they are abnormal. I will send you a MyChart message or a letter if they are normal.  If you do not hear about your labs in the next 2 weeks please let us know.  I recommend that you always bring your medications to each appointment as this makes it easy to ensure we are on the correct medications and helps [place] not miss when refills are needed.  I will see you in two weeks to discuss smoking cessation, we have some medications that can help!   Take care and seek immediate care sooner if you develop any concerns.   David Mccoy, MD [facility]

## 2022-04-24 DIAGNOSIS — Z7689 Persons encountering health services in other specified circumstances: Secondary | ICD-10-CM | POA: Insufficient documentation

## 2022-04-24 LAB — LIPID PANEL
Chol/HDL Ratio: 2.8 ratio (ref 0.0–5.0)
Cholesterol, Total: 136 mg/dL (ref 100–199)
HDL: 49 mg/dL (ref >39)
LDL Chol Calc (NIH): 67 mg/dL (ref 0–99)
Triglycerides: 113 mg/dL (ref 0–149)
VLDL Cholesterol Cal: 20 mg/dL (ref 5–40)

## 2022-04-24 NOTE — Assessment & Plan Note (Signed)
New patient visit completed. Had CMP and CBC at recent hospital admission.  - Scheduled to follow-up for smoking cessation in two weeks, open to medications - Lipid panel and A1c today  - Will discuss CT Lung and colonoscopy at follow-up - Explained nature of residency clinic

## 2022-05-07 ENCOUNTER — Ambulatory Visit: Payer: Self-pay | Admitting: Student

## 2022-06-07 ENCOUNTER — Emergency Department (HOSPITAL_COMMUNITY): Payer: Self-pay

## 2022-06-07 ENCOUNTER — Inpatient Hospital Stay (HOSPITAL_COMMUNITY)
Admission: EM | Admit: 2022-06-07 | Discharge: 2022-06-10 | DRG: 872 | Disposition: A | Discharge status: Home / Self Care | Payer: Self-pay | Attending: Internal Medicine | Admitting: Internal Medicine

## 2022-06-07 DIAGNOSIS — E860 Dehydration: Secondary | ICD-10-CM | POA: Diagnosis present

## 2022-06-07 DIAGNOSIS — Z880 Allergy status to penicillin: Secondary | ICD-10-CM

## 2022-06-07 DIAGNOSIS — Z888 Allergy status to other drugs, medicaments and biological substances status: Secondary | ICD-10-CM

## 2022-06-07 DIAGNOSIS — N12 Tubulo-interstitial nephritis, not specified as acute or chronic: Secondary | ICD-10-CM | POA: Diagnosis present

## 2022-06-07 DIAGNOSIS — B9620 Unspecified Escherichia coli [E. coli] as the cause of diseases classified elsewhere: Secondary | ICD-10-CM | POA: Diagnosis present

## 2022-06-07 DIAGNOSIS — R202 Paresthesia of skin: Secondary | ICD-10-CM | POA: Diagnosis present

## 2022-06-07 DIAGNOSIS — R197 Diarrhea, unspecified: Secondary | ICD-10-CM | POA: Diagnosis present

## 2022-06-07 DIAGNOSIS — Z833 Family history of diabetes mellitus: Secondary | ICD-10-CM

## 2022-06-07 DIAGNOSIS — Z8249 Family history of ischemic heart disease and other diseases of the circulatory system: Secondary | ICD-10-CM

## 2022-06-07 DIAGNOSIS — R29898 Other symptoms and signs involving the musculoskeletal system: Secondary | ICD-10-CM | POA: Diagnosis present

## 2022-06-07 DIAGNOSIS — E861 Hypovolemia: Secondary | ICD-10-CM | POA: Diagnosis present

## 2022-06-07 DIAGNOSIS — E871 Hypo-osmolality and hyponatremia: Secondary | ICD-10-CM | POA: Diagnosis present

## 2022-06-07 DIAGNOSIS — J449 Chronic obstructive pulmonary disease, unspecified: Secondary | ICD-10-CM | POA: Diagnosis present

## 2022-06-07 DIAGNOSIS — Z9049 Acquired absence of other specified parts of digestive tract: Secondary | ICD-10-CM

## 2022-06-07 DIAGNOSIS — Z79899 Other long term (current) drug therapy: Secondary | ICD-10-CM

## 2022-06-07 DIAGNOSIS — R7881 Bacteremia: Secondary | ICD-10-CM | POA: Diagnosis present

## 2022-06-07 DIAGNOSIS — R7303 Prediabetes: Secondary | ICD-10-CM | POA: Diagnosis present

## 2022-06-07 DIAGNOSIS — M545 Low back pain, unspecified: Secondary | ICD-10-CM

## 2022-06-07 DIAGNOSIS — Z91014 Allergy to mammalian meats: Secondary | ICD-10-CM

## 2022-06-07 DIAGNOSIS — A419 Sepsis, unspecified organism: Secondary | ICD-10-CM | POA: Diagnosis present

## 2022-06-07 DIAGNOSIS — I4510 Unspecified right bundle-branch block: Secondary | ICD-10-CM | POA: Diagnosis present

## 2022-06-07 DIAGNOSIS — F17290 Nicotine dependence, other tobacco product, uncomplicated: Secondary | ICD-10-CM | POA: Diagnosis present

## 2022-06-07 DIAGNOSIS — R200 Anesthesia of skin: Secondary | ICD-10-CM | POA: Diagnosis present

## 2022-06-07 DIAGNOSIS — N179 Acute kidney failure, unspecified: Secondary | ICD-10-CM | POA: Diagnosis present

## 2022-06-07 DIAGNOSIS — N390 Urinary tract infection, site not specified: Secondary | ICD-10-CM | POA: Diagnosis present

## 2022-06-07 DIAGNOSIS — R159 Full incontinence of feces: Secondary | ICD-10-CM | POA: Diagnosis present

## 2022-06-07 DIAGNOSIS — Z20822 Contact with and (suspected) exposure to covid-19: Secondary | ICD-10-CM | POA: Diagnosis present

## 2022-06-07 DIAGNOSIS — A4151 Sepsis due to Escherichia coli [E. coli]: Principal | ICD-10-CM | POA: Diagnosis present

## 2022-06-07 DIAGNOSIS — Z905 Acquired absence of kidney: Secondary | ICD-10-CM

## 2022-06-07 LAB — URINALYSIS, ROUTINE W REFLEX MICROSCOPIC
Bilirubin Urine: NEGATIVE
Glucose, UA: NEGATIVE mg/dL
Ketones, ur: NEGATIVE mg/dL
Nitrite: NEGATIVE
Protein, ur: 100 mg/dL — AB
Specific Gravity, Urine: 1.016 (ref 1.005–1.030)
pH: 5 (ref 5.0–8.0)

## 2022-06-07 LAB — CBC WITH DIFFERENTIAL/PLATELET
Abs Immature Granulocytes: 0.12 10*3/uL — ABNORMAL HIGH (ref 0.00–0.07)
Basophils Absolute: 0.1 10*3/uL (ref 0.0–0.1)
Basophils Relative: 0 %
Eosinophils Absolute: 0.4 10*3/uL (ref 0.0–0.5)
Eosinophils Relative: 2 %
HCT: 38.1 % — ABNORMAL LOW (ref 39.0–52.0)
Hemoglobin: 12.7 g/dL — ABNORMAL LOW (ref 13.0–17.0)
Immature Granulocytes: 1 %
Lymphocytes Relative: 9 %
Lymphs Abs: 1.4 10*3/uL (ref 0.7–4.0)
MCH: 34.3 pg — ABNORMAL HIGH (ref 26.0–34.0)
MCHC: 33.3 g/dL (ref 30.0–36.0)
MCV: 103 fL — ABNORMAL HIGH (ref 80.0–100.0)
Monocytes Absolute: 1.7 10*3/uL — ABNORMAL HIGH (ref 0.1–1.0)
Monocytes Relative: 11 %
Neutro Abs: 12.6 10*3/uL — ABNORMAL HIGH (ref 1.7–7.7)
Neutrophils Relative %: 77 %
Platelets: 233 10*3/uL (ref 150–400)
RBC: 3.7 MIL/uL — ABNORMAL LOW (ref 4.22–5.81)
RDW: 14.4 % (ref 11.5–15.5)
WBC: 16.3 10*3/uL — ABNORMAL HIGH (ref 4.0–10.5)
nRBC: 0 % (ref 0.0–0.2)

## 2022-06-07 LAB — LACTIC ACID, PLASMA
Lactic Acid, Venous: 1.4 mmol/L (ref 0.5–1.9)
Lactic Acid, Venous: 1 mmol/L (ref 0.5–1.9)

## 2022-06-07 LAB — RESP PANEL BY RT-PCR (FLU A&B, COVID) ARPGX2
Influenza A by PCR: NEGATIVE
Influenza B by PCR: NEGATIVE
SARS Coronavirus 2 by RT PCR: NEGATIVE

## 2022-06-07 LAB — COMPREHENSIVE METABOLIC PANEL
ALT: 14 U/L (ref 0–44)
AST: 22 U/L (ref 15–41)
Albumin: 3.3 g/dL — ABNORMAL LOW (ref 3.5–5.0)
Alkaline Phosphatase: 77 U/L (ref 38–126)
Anion gap: 12 (ref 5–15)
BUN: 22 mg/dL — ABNORMAL HIGH (ref 6–20)
CO2: 22 mmol/L (ref 22–32)
Calcium: 8.1 mg/dL — ABNORMAL LOW (ref 8.9–10.3)
Chloride: 96 mmol/L — ABNORMAL LOW (ref 98–111)
Creatinine, Ser: 1.45 mg/dL — ABNORMAL HIGH (ref 0.61–1.24)
GFR, Estimated: 57 mL/min — ABNORMAL LOW (ref >60)
Glucose, Bld: 109 mg/dL — ABNORMAL HIGH (ref 70–99)
Potassium: 3.8 mmol/L (ref 3.5–5.1)
Sodium: 130 mmol/L — ABNORMAL LOW (ref 135–145)
Total Bilirubin: 0.4 mg/dL (ref 0.3–1.2)
Total Protein: 7.8 g/dL (ref 6.5–8.1)

## 2022-06-07 LAB — APTT: aPTT: 35 seconds (ref 24–36)

## 2022-06-07 LAB — PROTIME-INR
INR: 1.2 (ref 0.8–1.2)
Prothrombin Time: 15 seconds (ref 11.4–15.2)

## 2022-06-07 MED ORDER — ACETAMINOPHEN 500 MG PO TABS
1000.0000 mg | ORAL_TABLET | Freq: Four times a day (QID) | ORAL | Status: DC | PRN
  Administered 2022-06-08 – 2022-06-09 (×6): 1000 mg via ORAL
  Filled 2022-06-07 (×7): qty 2

## 2022-06-07 MED ORDER — MORPHINE SULFATE (PF) 2 MG/ML IV SOLN
1.0000 mg | INTRAVENOUS | Status: DC | PRN
  Administered 2022-06-07 – 2022-06-10 (×14): 1 mg via INTRAVENOUS
  Filled 2022-06-07 (×14): qty 1

## 2022-06-07 MED ORDER — VANCOMYCIN HCL 1250 MG/250ML IV SOLN: 1250.0000 mg | INTRAVENOUS | Status: DC

## 2022-06-07 MED ORDER — IPRATROPIUM-ALBUTEROL 0.5-2.5 (3) MG/3ML IN SOLN
3.0000 mL | Freq: Four times a day (QID) | RESPIRATORY_TRACT | Status: DC | PRN
  Administered 2022-06-07 – 2022-06-09 (×2): 3 mL via RESPIRATORY_TRACT
  Filled 2022-06-07 (×2): qty 3

## 2022-06-07 MED ORDER — VANCOMYCIN HCL IN DEXTROSE 1-5 GM/200ML-% IV SOLN
1000.0000 mg | Freq: Once | INTRAVENOUS | Status: AC
  Administered 2022-06-07: 1000 mg via INTRAVENOUS
  Filled 2022-06-07: qty 200

## 2022-06-07 MED ORDER — METRONIDAZOLE 500 MG/100ML IV SOLN
500.0000 mg | Freq: Two times a day (BID) | INTRAVENOUS | Status: DC
  Administered 2022-06-08: 500 mg via INTRAVENOUS
  Filled 2022-06-07: qty 100

## 2022-06-07 MED ORDER — SODIUM CHLORIDE 0.9 % IV SOLN
2.0000 g | Freq: Two times a day (BID) | INTRAVENOUS | Status: DC
  Administered 2022-06-08: 2 g via INTRAVENOUS
  Filled 2022-06-07: qty 12.5

## 2022-06-07 MED ORDER — LACTATED RINGERS IV BOLUS (SEPSIS)
1000.0000 mL | Freq: Once | INTRAVENOUS | Status: AC
  Administered 2022-06-07: 1000 mL via INTRAVENOUS

## 2022-06-07 MED ORDER — SODIUM CHLORIDE 0.9 % IV SOLN: INTRAVENOUS | Status: DC

## 2022-06-07 MED ORDER — LACTATED RINGERS IV BOLUS (SEPSIS)
250.0000 mL | Freq: Once | INTRAVENOUS | Status: AC
  Administered 2022-06-07: 250 mL via INTRAVENOUS

## 2022-06-07 MED ORDER — METRONIDAZOLE 500 MG/100ML IV SOLN
500.0000 mg | Freq: Once | INTRAVENOUS | Status: AC
  Administered 2022-06-07: 500 mg via INTRAVENOUS
  Filled 2022-06-07: qty 100

## 2022-06-07 MED ORDER — SODIUM CHLORIDE 0.9 % IV SOLN
2.0000 g | Freq: Once | INTRAVENOUS | Status: AC
  Administered 2022-06-07: 2 g via INTRAVENOUS
  Filled 2022-06-07: qty 12.5

## 2022-06-07 MED ORDER — LACTATED RINGERS IV SOLN: INTRAVENOUS | Status: DC

## 2022-06-07 MED ORDER — MORPHINE SULFATE (PF) 4 MG/ML IV SOLN
4.0000 mg | Freq: Once | INTRAVENOUS | Status: AC
  Administered 2022-06-07: 4 mg via INTRAVENOUS
  Filled 2022-06-07: qty 1

## 2022-06-07 NOTE — Progress Notes (Signed)
Elink following code sepsis °

## 2022-06-07 NOTE — Progress Notes (Signed)
A consult was received from an ED physician for Vanco, Cefepime per pharmacy dosing.  The patient's profile has been reviewed for ht/wt/allergies/indication/available labs.   A one time order has been placed for Cefepime 2g IV x 1 and Vanco 1g IV x 1.  Further antibiotics/pharmacy consults should be ordered by admitting physician if indicated.                       Aum Caggiano S. Merilynn Finland, PharmD, BCPS Clinical Staff Pharmacist [email or web address] Thank you, Pasty Spillers 06/07/2022  5:07 PM

## 2022-06-07 NOTE — Progress Notes (Signed)
Pharmacy Antibiotic Note  David Benson is a 56 y.o. male admitted on 06/07/2022 with sepsis.  Suspect urinary source of infection. Pharmacy has been consulted for Cefepime + Vancomycin dosing.  Plan: Cefepime 2gm IV q12h Vancomycin 1250mg  IV q24h to target AUC 400-550 (Estimated AUC 476) Check Vancomycin levels at steady state if continues.  Consider d/c Vancomycin if urinary source of infection. Monitor renal function and cx data   Height: 5\' 9"  (175.3 cm) Weight: 70.3 kg (154 lb 15.7 oz) IBW/kg (Calculated) : 70.7  Temp (24hrs), Avg:101.5 F (38.6 C), Min:100 F (37.8 C), Max:103.2 F (39.6 C)  Recent Labs  Lab 06/07/22 1635 06/07/22 1859  WBC 16.3*  --   CREATININE 1.45*  --   LATICACIDVEN 1.4 1.0    Estimated Creatinine Clearance: 57.2 mL/min (A) (by C-G formula based on SCr of 1.45 mg/dL (H)).    Allergies  Allergen Reactions   Naprosyn [Naproxen] Shortness Of Breath, Swelling and Other (See Comments)    Tongue became swollen and developed white spots   Other Itching and Other (See Comments)    Seasonal allergies- Itchy eyes, congestion, runny nose, etc..   Penicillins Itching    Has patient had a PCN reaction causing immediate rash, facial/tongue/throat swelling, SOB or lightheadedness with hypotension: No Has patient had a PCN reaction causing severe rash involving mucus membranes or skin necrosis: No Has patient had a PCN reaction that required hospitalization No Has patient had a PCN reaction occurring within the last 10 years: No If all of the above answers are "NO", then may proceed with Cephalosporin use.   Pork-Derived Products Other (See Comments)    Pt does not eat pork   Adhesive [Tape] Rash and Other (See Comments)    Patient cannot tolerate this for a period of time    Antimicrobials this admission: 8/17 Cefepime >>  8/17 Metronidazole >>  8/17 Vancomycin >>  Dose adjustments this admission:  Microbiology results: 8/17 BCx:  8/17 UCx:   8/17 Resp PCR: negative  GI PCR:  Thank you for allowing pharmacy to be a part of this patient's care.  9/17 PharmD 06/07/2022 10:25 PM
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
## 2022-06-07 NOTE — ED Notes (Signed)
Pt transported to CT ?

## 2022-06-07 NOTE — ED Triage Notes (Signed)
Pt BIB EMS from home for N/V/D, right flank pain, chills

## 2022-06-07 NOTE — Progress Notes (Signed)
Notified bedside nurse of need to administer antibiotics and fluid bolus.  

## 2022-06-08 ENCOUNTER — Other Ambulatory Visit: Payer: Self-pay

## 2022-06-08 ENCOUNTER — Encounter (HOSPITAL_COMMUNITY): Payer: Self-pay | Admitting: Internal Medicine

## 2022-06-08 DIAGNOSIS — N179 Acute kidney failure, unspecified: Secondary | ICD-10-CM

## 2022-06-08 DIAGNOSIS — R7881 Bacteremia: Secondary | ICD-10-CM | POA: Diagnosis present

## 2022-06-08 DIAGNOSIS — B9620 Unspecified Escherichia coli [E. coli] as the cause of diseases classified elsewhere: Secondary | ICD-10-CM | POA: Diagnosis present

## 2022-06-08 DIAGNOSIS — J449 Chronic obstructive pulmonary disease, unspecified: Secondary | ICD-10-CM

## 2022-06-08 DIAGNOSIS — E871 Hypo-osmolality and hyponatremia: Secondary | ICD-10-CM

## 2022-06-08 DIAGNOSIS — R7303 Prediabetes: Secondary | ICD-10-CM

## 2022-06-08 DIAGNOSIS — R29898 Other symptoms and signs involving the musculoskeletal system: Secondary | ICD-10-CM

## 2022-06-08 DIAGNOSIS — N10 Acute tubulo-interstitial nephritis: Secondary | ICD-10-CM

## 2022-06-08 LAB — CBC
HCT: 34.1 % — ABNORMAL LOW (ref 39.0–52.0)
Hemoglobin: 11 g/dL — ABNORMAL LOW (ref 13.0–17.0)
MCH: 34 pg (ref 26.0–34.0)
MCHC: 32.3 g/dL (ref 30.0–36.0)
MCV: 105.2 fL — ABNORMAL HIGH (ref 80.0–100.0)
Platelets: 206 10*3/uL (ref 150–400)
RBC: 3.24 MIL/uL — ABNORMAL LOW (ref 4.22–5.81)
RDW: 14.7 % (ref 11.5–15.5)
WBC: 11.4 10*3/uL — ABNORMAL HIGH (ref 4.0–10.5)
nRBC: 0 % (ref 0.0–0.2)

## 2022-06-08 LAB — BLOOD CULTURE ID PANEL (REFLEXED) - BCID2

## 2022-06-08 LAB — BASIC METABOLIC PANEL
Anion gap: 6 (ref 5–15)
BUN: 20 mg/dL (ref 6–20)
CO2: 22 mmol/L (ref 22–32)
Calcium: 7.9 mg/dL — ABNORMAL LOW (ref 8.9–10.3)
Chloride: 110 mmol/L (ref 98–111)
Creatinine, Ser: 1.33 mg/dL — ABNORMAL HIGH (ref 0.61–1.24)
GFR, Estimated: >60 mL/min (ref >60)
Glucose, Bld: 98 mg/dL (ref 70–99)
Potassium: 4 mmol/L (ref 3.5–5.1)
Sodium: 138 mmol/L (ref 135–145)

## 2022-06-08 LAB — C DIFFICILE QUICK SCREEN W PCR REFLEX
C Diff antigen: NEGATIVE
C Diff interpretation: NOT DETECTED
C Diff toxin: NEGATIVE

## 2022-06-08 LAB — MRSA NEXT GEN BY PCR, NASAL: MRSA by PCR Next Gen: NOT DETECTED

## 2022-06-08 MED ORDER — SODIUM CHLORIDE 0.9 % IV SOLN: INTRAVENOUS | Status: DC

## 2022-06-08 MED ORDER — SODIUM CHLORIDE 0.9 % IV SOLN
2.0000 g | INTRAVENOUS | Status: DC
  Administered 2022-06-08 – 2022-06-09 (×2): 2 g via INTRAVENOUS
  Filled 2022-06-08 (×2): qty 20

## 2022-06-08 MED ORDER — BOOST / RESOURCE BREEZE PO LIQD CUSTOM
1.0000 | Freq: Two times a day (BID) | ORAL | Status: DC
  Administered 2022-06-08 – 2022-06-09 (×3): 1 via ORAL

## 2022-06-08 MED ORDER — ADULT MULTIVITAMIN W/MINERALS CH
1.0000 | ORAL_TABLET | Freq: Every day | ORAL | Status: DC
  Administered 2022-06-08 – 2022-06-10 (×3): 1 via ORAL
  Filled 2022-06-08 (×3): qty 1

## 2022-06-08 NOTE — TOC Initial Note (Signed)
Transition of Care [facility]) - Initial/Assessment Note    Patient Details  Name: David Benson MRN: [id number] Date of Birth: 09-14-1966  Transition of Care [facility]) CM/SW Contact:    Golda Acre, RN Phone Number: 06/08/2022, 8:43 AM  Clinical Narrative:                 Will need substance abuse information. Resources added to the dc instructions sheet for pt use.  Expected Discharge Plan: Home/Self Care Barriers to Discharge: Continued Medical Work up   Patient Goals and CMS Choice Patient states their goals for this hospitalization and ongoing recovery are:: to go home CMS Medicare.gov Compare Post Acute Care list provided to:: Patient Choice offered to / list presented to : Patient  Expected Discharge Plan and Services Expected Discharge Plan: Home/Self Care   Discharge Planning Services: CM Consult   Living arrangements for the past 2 months: Apartment                                      Prior Living Arrangements/Services Living arrangements for the past 2 months: Apartment Lives with:: Spouse Patient language and need for interpreter reviewed:: Yes Do you feel safe going back to the place where you live?: Yes            Criminal Activity/Legal Involvement Pertinent to Current Situation/Hospitalization: No - Comment as needed  Activities of Daily Living Home Assistive Devices/Equipment: Walker (specify type) ADL Screening (condition at time of admission) Patient's cognitive ability adequate to safely complete daily activities?: Yes Is the patient deaf or have difficulty hearing?: No Does the patient have difficulty seeing, even when wearing glasses/contacts?: No Does the patient have difficulty concentrating, remembering, or making decisions?: No Patient able to express need for assistance with ADLs?: Yes Does the patient have difficulty dressing or bathing?: No Independently performs ADLs?: Yes (appropriate for developmental age) Does the patient  have difficulty walking or climbing stairs?: No Weakness of Legs: Both Weakness of Arms/Hands: Both  Permission Sought/Granted                  Emotional Assessment Appearance:: Appears stated age Attitude/Demeanor/Rapport: Engaged Affect (typically observed): Calm Orientation: : Oriented to Self, Oriented to Place, Oriented to  Time, Oriented to Situation Alcohol / Substance Use: Illicit Drugs, Tobacco Use (sokes cigars ajnd cigarettes, uses cocaine, no etoh) Psych Involvement: No (comment)  Admission diagnosis:  Pyelonephritis [N12] Left leg numbness [R20.0] Left leg weakness [R29.898] Sepsis (HCC) [A41.9] Acute low back pain without sciatica, unspecified back pain laterality [M54.50] Sepsis, due to unspecified organism, unspecified whether acute organ dysfunction present [facility]) [A41.9] Patient Active Problem List   Diagnosis Date Noted   Sepsis (HCC) 06/07/2022   UTI (urinary tract infection) 06/07/2022   Weakness of left lower extremity 06/07/2022   Hyponatremia 06/07/2022   Prediabetes 06/07/2022   Encounter to establish care with new doctor 04/24/2022   Protein-calorie malnutrition, severe 04/03/2022   Small bowel obstruction (HCC) 03/19/2020   Aspiration pneumonia (HCC) 02/20/2019   Lactic acid acidosis 02/20/2019   Sepsis, Gram negative (HCC) 02/20/2019   AKI (acute kidney injury) (HCC) 02/20/2019   Alcoholic hepatitis without ascites 02/20/2019   Post-ictal state (HCC) 10/06/2018   SBO (small bowel obstruction) (HCC) 08/10/2016   Polysubstance abuse (HCC) 08/10/2016   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   Seizure disorder (HCC) 11/23/2015   Poor dentition 11/23/2015  Macrocytic anemia    Dysphagia    Dyshidrotic eczema 06/11/2012   Tobacco abuse 06/10/2012   Oral candidiasis 05/20/2012   Tinea corporis 05/20/2012   Fracture of corpus cavernosum penis 09/16/2011    Class: Acute   Alcohol abuse 01/24/2010   DEPRESSION 01/24/2010   COPD (chronic  obstructive pulmonary disease) (HCC) 01/24/2010   GERD 01/24/2010   OSTEOARTHRITIS 01/24/2010   Personal history of other disorders of nervous system and sense organs 01/24/2010   PCP:  Alicia Amel, MD Pharmacy:   [facility] [address] [place] [address] Phone: [phone] Fax: [phone]     Social Determinants of Health (SDOH) Interventions    Readmission Risk Interventions    07/05/2020   11:50 AM  Readmission Risk Prevention Plan  Transportation Screening Complete  Medication Review (RN Care Manager) Referral to Pharmacy  PCP or Specialist appointment within 3-5 days of discharge Complete  HRI or Home Care Consult Complete  SW Recovery Care/Counseling Consult Complete  Palliative Care Screening Not Applicable  Skilled Nursing Facility Not Applicable
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
## 2022-06-08 NOTE — Progress Notes (Signed)
PHARMACY - PHYSICIAN COMMUNICATION CRITICAL VALUE ALERT - BLOOD CULTURE IDENTIFICATION (BCID)  David Benson is an 56 y.o. male who presented to [facility] on 06/07/2022 with sepsis.  Assessment: 1/4 E.coli, no resistance detected (likely urinary source)   Name of physician (or Provider) Contacted: Dr. [place]  Current antibiotics: cefepime, vancomycin, metronidazole  Changes to prescribed antibiotics recommended: ceftriaxone 2 g q24h  Results for orders placed or performed during the hospital encounter of 06/07/22  Blood Culture ID Panel (Reflexed) (Collected: 06/07/2022  4:35 PM)  Result Value Ref Range   Enterococcus faecalis NOT DETECTED NOT DETECTED   Enterococcus Faecium NOT DETECTED NOT DETECTED   Listeria monocytogenes NOT DETECTED NOT DETECTED   Staphylococcus species NOT DETECTED NOT DETECTED   Staphylococcus aureus (BCID) NOT DETECTED NOT DETECTED   Staphylococcus epidermidis NOT DETECTED NOT DETECTED   Staphylococcus lugdunensis NOT DETECTED NOT DETECTED   Streptococcus species NOT DETECTED NOT DETECTED   Streptococcus agalactiae NOT DETECTED NOT DETECTED   Streptococcus pneumoniae NOT DETECTED NOT DETECTED   Streptococcus pyogenes NOT DETECTED NOT DETECTED   A.calcoaceticus-baumannii NOT DETECTED NOT DETECTED   Bacteroides fragilis NOT DETECTED NOT DETECTED   Enterobacterales DETECTED (A) NOT DETECTED   Enterobacter cloacae complex NOT DETECTED NOT DETECTED   Escherichia coli DETECTED (A) NOT DETECTED   Klebsiella aerogenes NOT DETECTED NOT DETECTED   Klebsiella oxytoca NOT DETECTED NOT DETECTED   Klebsiella pneumoniae NOT DETECTED NOT DETECTED   Proteus species NOT DETECTED NOT DETECTED   Salmonella species NOT DETECTED NOT DETECTED   Serratia marcescens NOT DETECTED NOT DETECTED   Haemophilus influenzae NOT DETECTED NOT DETECTED   Neisseria meningitidis NOT DETECTED NOT DETECTED   Pseudomonas aeruginosa NOT DETECTED NOT DETECTED   Stenotrophomonas maltophilia  NOT DETECTED NOT DETECTED   Candida albicans NOT DETECTED NOT DETECTED   Candida auris NOT DETECTED NOT DETECTED   Candida glabrata NOT DETECTED NOT DETECTED   Candida krusei NOT DETECTED NOT DETECTED   Candida parapsilosis NOT DETECTED NOT DETECTED   Candida tropicalis NOT DETECTED NOT DETECTED   Cryptococcus neoformans/gattii NOT DETECTED NOT DETECTED   CTX-M ESBL NOT DETECTED NOT DETECTED   Carbapenem resistance IMP NOT DETECTED NOT DETECTED   Carbapenem resistance KPC NOT DETECTED NOT DETECTED   Carbapenem resistance NDM NOT DETECTED NOT DETECTED   Carbapenem resist OXA 48 LIKE NOT DETECTED NOT DETECTED   Carbapenem resistance VIM NOT DETECTED NOT DETECTED    Pricilla Riffle, PharmD, BCPS Clinical Pharmacist 06/08/2022 8:30 AM
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
## 2022-06-08 NOTE — Progress Notes (Signed)
Mobility Specialist - Progress Note   06/08/22 1029  Mobility  HOB Elevated/Bed Position Self regulated  Activity Ambulated with assistance in hallway  Range of Motion/Exercises Active  Level of Assistance Contact guard assist, steadying assist  Assistive Device Front wheel walker  Distance Ambulated (ft) 50 ft  Activity Response Tolerated well  Transport method Ambulatory  $Mobility charge 1 Mobility   Pt received in bed  and agreeable to mobility. C/o left abdominal  pain, as well as left leg weakness.  during mobility. Pt c/o feeling cold post ambulation. Pt to bed after session with all needs met.     Post-mobility: 117/56 BP, 91% SPO2    Set designer
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
## 2022-06-09 DIAGNOSIS — B9620 Unspecified Escherichia coli [E. coli] as the cause of diseases classified elsewhere: Secondary | ICD-10-CM

## 2022-06-09 DIAGNOSIS — R7881 Bacteremia: Secondary | ICD-10-CM

## 2022-06-09 LAB — GASTROINTESTINAL PANEL BY PCR, STOOL (REPLACES STOOL CULTURE)

## 2022-06-09 LAB — URINE CULTURE: Culture: 10000 — AB

## 2022-06-09 LAB — CBC
HCT: 33.1 % — ABNORMAL LOW (ref 39.0–52.0)
Hemoglobin: 10.8 g/dL — ABNORMAL LOW (ref 13.0–17.0)
MCH: 34.1 pg — ABNORMAL HIGH (ref 26.0–34.0)
MCHC: 32.6 g/dL (ref 30.0–36.0)
MCV: 104.4 fL — ABNORMAL HIGH (ref 80.0–100.0)
Platelets: 221 10*3/uL (ref 150–400)
RBC: 3.17 MIL/uL — ABNORMAL LOW (ref 4.22–5.81)
RDW: 14.8 % (ref 11.5–15.5)
WBC: 6.9 10*3/uL (ref 4.0–10.5)
nRBC: 0 % (ref 0.0–0.2)

## 2022-06-09 LAB — BASIC METABOLIC PANEL
Anion gap: 4 — ABNORMAL LOW (ref 5–15)
BUN: 15 mg/dL (ref 6–20)
CO2: 23 mmol/L (ref 22–32)
Calcium: 8 mg/dL — ABNORMAL LOW (ref 8.9–10.3)
Chloride: 111 mmol/L (ref 98–111)
Creatinine, Ser: 1.1 mg/dL (ref 0.61–1.24)
GFR, Estimated: >60 mL/min (ref >60)
Glucose, Bld: 95 mg/dL (ref 70–99)
Potassium: 4.2 mmol/L (ref 3.5–5.1)
Sodium: 138 mmol/L (ref 135–145)

## 2022-06-09 MED ORDER — GUAIFENESIN-DM 100-10 MG/5ML PO SYRP
5.0000 mL | ORAL_SOLUTION | ORAL | Status: DC | PRN
  Administered 2022-06-09 (×2): 5 mL via ORAL
  Filled 2022-06-09 (×2): qty 10
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
## 2022-06-10 LAB — CULTURE, BLOOD (ROUTINE X 2)

## 2022-06-10 LAB — BASIC METABOLIC PANEL
Anion gap: 4 — ABNORMAL LOW (ref 5–15)
BUN: 11 mg/dL (ref 6–20)
CO2: 25 mmol/L (ref 22–32)
Calcium: 7.8 mg/dL — ABNORMAL LOW (ref 8.9–10.3)
Chloride: 108 mmol/L (ref 98–111)
Creatinine, Ser: 0.92 mg/dL (ref 0.61–1.24)
GFR, Estimated: >60 mL/min (ref >60)
Glucose, Bld: 146 mg/dL — ABNORMAL HIGH (ref 70–99)
Potassium: 3.8 mmol/L (ref 3.5–5.1)
Sodium: 137 mmol/L (ref 135–145)

## 2022-06-10 LAB — CBC
HCT: 32.1 % — ABNORMAL LOW (ref 39.0–52.0)
Hemoglobin: 10.4 g/dL — ABNORMAL LOW (ref 13.0–17.0)
MCH: 34.4 pg — ABNORMAL HIGH (ref 26.0–34.0)
MCHC: 32.4 g/dL (ref 30.0–36.0)
MCV: 106.3 fL — ABNORMAL HIGH (ref 80.0–100.0)
Platelets: 242 10*3/uL (ref 150–400)
RBC: 3.02 MIL/uL — ABNORMAL LOW (ref 4.22–5.81)
RDW: 15.2 % (ref 11.5–15.5)
WBC: 6.7 10*3/uL (ref 4.0–10.5)
nRBC: 0 % (ref 0.0–0.2)

## 2022-06-10 MED ORDER — OXYCODONE HCL 5 MG PO TABS: 5.0000 mg | ORAL_TABLET | Freq: Four times a day (QID) | ORAL | 0 refills | Status: DC | PRN

## 2022-06-10 MED ORDER — CIPROFLOXACIN HCL 500 MG PO TABS: 500.0000 mg | ORAL_TABLET | Freq: Two times a day (BID) | ORAL | 0 refills | Status: AC

## 2022-06-10 NOTE — Progress Notes (Signed)
Pt discharge delay due to ride availability. Daughter to be here at 5 pm

## 2022-06-10 NOTE — Discharge Summary (Signed)
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 history, and epidemiological information. The expected result is Negative.  Fact Sheet for Patients:  BloggerCourse.com  Fact Sheet for Healthcare Providers:  SeriousBroker.it  This test is no t yet approved or cleared by the [place] FDA and  has been authorized for detection and/or diagnosis of SARS-CoV-2 by FDA under an Emergency Use Authorization (EUA). This EUA will remain  in effect (meaning this test can be used) for the duration of the COVID-19 declaration under Section 564(b)(1) of the Act, 21 U.S.C.section 360bbb-3(b)(1), unless the authorization is terminated  or revoked sooner.       Influenza A by PCR NEGATIVE NEGATIVE Final   Influenza B by PCR NEGATIVE NEGATIVE Final    Comment: (NOTE) The  Xpert Xpress SARS-CoV-2/FLU/RSV plus assay is intended as an aid in the diagnosis of influenza from Nasopharyngeal swab specimens and  should not be used as a sole basis for treatment. Nasal washings and aspirates are unacceptable for Xpert Xpress SARS-CoV-2/FLU/RSV testing.  Fact Sheet for Patients: BloggerCourse.com  Fact Sheet for Healthcare Providers: SeriousBroker.it  This test is not yet approved or cleared by the [place] FDA and has been authorized for detection and/or diagnosis of SARS-CoV-2 by FDA under an Emergency Use Authorization (EUA). This EUA will remain in effect (meaning this test can be used) for the duration of the COVID-19 declaration under Section 564(b)(1) of the Act, 21 U.S.C. section 360bbb-3(b)(1), unless the authorization is terminated or revoked.  Performed at [facility], [address] [place], [place] [address]   Urine Culture     Status: Abnormal   Collection Time: 06/07/22  6:37 PM   Specimen: In/Out Cath Urine  Result Value Ref Range Status   Specimen Description   Final    IN/OUT CATH URINE Performed at [facility], [address], [place], [place] [address]    Special Requests   Final    NONE Performed at [facility], [address] [place], [place] [address]    Culture (A)  Final    10,000 COLONIES/mL MULTIPLE SPECIES PRESENT, SUGGEST RECOLLECTION   Report Status 06/09/2022 FINAL  Final  MRSA Next Gen by PCR, Nasal     Status: None   Collection Time: 06/08/22  8:59 AM   Specimen: Nasal Mucosa; Nasal Swab  Result Value Ref Range Status   MRSA by PCR Next Gen NOT DETECTED NOT DETECTED Final    Comment: (NOTE) The GeneXpert MRSA Assay (FDA approved for NASAL specimens only), is one component of a comprehensive MRSA colonization surveillance program. It is not intended to diagnose MRSA infection nor to guide or monitor  treatment for MRSA infections. Test performance is not FDA approved in patients less than 26 years old. Performed at [facility], [address] [place], [place] [address]      Labs: BNP (last 3 results) No results for input(s): "BNP" in the last 8760 hours. Basic Metabolic Panel: Recent Labs  Lab 06/07/22 1635 06/08/22 0517 06/09/22 0908 06/10/22 0450  NA 130* 138 138 137  K 3.8 4.0 4.2 3.8  CL 96* 110 111 108  CO2 22 22 23 25   GLUCOSE 109* 98 95 146*  BUN 22* 20 15 11   CREATININE 1.45* 1.33* 1.10 0.92  CALCIUM 8.1* 7.9* 8.0* 7.8*   Liver Function Tests: Recent Labs  Lab 06/07/22 1635  AST 22  ALT 14  ALKPHOS 77  BILITOT 0.4  PROT 7.8  ALBUMIN 3.3*   No results for input(s): "LIPASE", "AMYLASE" in the last 168 hours. No results for input(s): "AMMONIA" in the last 168 hours. CBC: Recent Labs  Lab 06/07/22 1635 06/08/22 0517 06/09/22 0521 06/10/22 0450  WBC 16.3* 11.4* 6.9 6.7  NEUTROABS 12.6*  --   --   --   HGB 12.7* 11.0* 10.8* 10.4*  HCT 38.1* 34.1* 33.1* 32.1*  MCV 103.0* 105.2* 104.4* 106.3*  PLT 233 206 221 242   Cardiac Enzymes: No results for input(s): "CKTOTAL", "CKMB", "CKMBINDEX", "TROPONINI" in the last 168 hours. BNP: Invalid input(s): "POCBNP" CBG: No results for input(s): "GLUCAP" in the last 168 hours. D-Dimer No results for input(s): "DDIMER" in the last 72 hours. Hgb A1c No results for input(s): "HGBA1C" in the last 72 hours. Lipid Profile No results for input(s): "CHOL", "HDL", "LDLCALC", "TRIG", "CHOLHDL", "LDLDIRECT" in the last 72 hours. Thyroid  function studies No results for input(s): "TSH", "T4TOTAL", "T3FREE", "THYROIDAB" in the last 72 hours.  Invalid input(s): "FREET3" Anemia work up No results for input(s): "VITAMINB12", "FOLATE", "FERRITIN", "TIBC", "IRON", "RETICCTPCT" in the last 72 hours. Urinalysis    Component Value Date/Time   COLORURINE YELLOW 06/07/2022 1837   APPEARANCEUR HAZY (A)  06/07/2022 1837   LABSPEC 1.016 06/07/2022 1837   PHURINE 5.0 06/07/2022 1837   GLUCOSEU NEGATIVE 06/07/2022 1837   HGBUR MODERATE (A) 06/07/2022 1837   BILIRUBINUR NEGATIVE 06/07/2022 1837   KETONESUR NEGATIVE 06/07/2022 1837   PROTEINUR 100 (A) 06/07/2022 1837   UROBILINOGEN 0.2 03/04/2018 1216   NITRITE NEGATIVE 06/07/2022 1837   LEUKOCYTESUR TRACE (A) 06/07/2022 1837   Sepsis Labs Recent Labs  Lab 06/07/22 1635 06/08/22 0517 06/09/22 0521 06/10/22 0450  WBC 16.3* 11.4* 6.9 6.7   Microbiology Recent Results (from the past 240 hour(s))  C Difficile Quick Screen w PCR reflex     Status: None   Collection Time: 06/07/22 10:26 AM   Specimen: STOOL  Result Value Ref Range Status   C Diff antigen NEGATIVE NEGATIVE Final   C Diff toxin NEGATIVE NEGATIVE Final   C Diff interpretation No C. difficile detected.  Final    Comment: Performed at [facility], [address], [place], [place] [address]  Gastrointestinal Panel by PCR , Stool     Status: None   Collection Time: 06/07/22 10:26 AM   Specimen: STOOL  Result Value Ref Range Status   Campylobacter species NOT DETECTED NOT DETECTED Final   Plesimonas shigelloides NOT DETECTED NOT DETECTED Final   Salmonella species NOT DETECTED NOT DETECTED Final   Yersinia enterocolitica NOT DETECTED NOT DETECTED Final   Vibrio species NOT DETECTED NOT DETECTED Final   Vibrio cholerae NOT DETECTED NOT DETECTED Final   Enteroaggregative E coli (EAEC) NOT DETECTED NOT DETECTED Final   Enteropathogenic E coli (EPEC) NOT DETECTED NOT DETECTED Final   Enterotoxigenic E coli (ETEC) NOT DETECTED NOT DETECTED Final   Shiga like toxin producing E coli (STEC) NOT DETECTED NOT DETECTED Final   Shigella/Enteroinvasive E coli (EIEC) NOT DETECTED NOT DETECTED Final   Cryptosporidium NOT DETECTED NOT DETECTED Final   Cyclospora cayetanensis NOT DETECTED NOT DETECTED Final   Entamoeba histolytica NOT DETECTED NOT DETECTED Final    Giardia lamblia NOT DETECTED NOT DETECTED Final   Adenovirus F40/41 NOT DETECTED NOT DETECTED Final   Astrovirus NOT DETECTED NOT DETECTED Final   Norovirus GI/GII NOT DETECTED NOT DETECTED Final   Rotavirus A NOT DETECTED NOT DETECTED Final   Sapovirus (I, II, IV, and V) NOT DETECTED NOT DETECTED Final    Comment: Performed at [facility], [address] [place], [place] [address]  Culture, blood (Routine x 2)     Status: Abnormal   Collection Time: 06/07/22  4:35 PM   Specimen: BLOOD  Result Value Ref Range Status   Specimen Description   Final    BLOOD RIGHT ANTECUBITAL Performed at [facility], [address] [place], [place] [address]    Special Requests   Final    BOTTLES DRAWN AEROBIC AND ANAEROBIC Blood Culture results may not be optimal due to an excessive volume of blood received in culture bottles Performed at [facility], [address], [place], [place] [address]    Culture  Setup Time GRAM NEGATIVE RODS AEROBIC BOTTLE ONLY   Final   Culture (A)  Final    ESCHERICHIA COLI  SUSCEPTIBILITIES PERFORMED ON PREVIOUS CULTURE WITHIN THE LAST 5 DAYS. Performed at [facility], [address] [place], [place] [address]    Report Status 06/10/2022 FINAL  Final  Culture, blood (Routine x 2)     Status: Abnormal   Collection Time: 06/07/22  4:35 PM   Specimen: BLOOD  Result Value Ref Range Status   Specimen Description   Final    BLOOD LEFT ANTECUBITAL Performed at [facility], [address], [place], [place] [address]    Special Requests   Final    BOTTLES DRAWN AEROBIC AND ANAEROBIC Blood Culture results may not be optimal due to an excessive volume of blood received in culture bottles Performed at [facility], [address], [place], [place] [address]    Culture  Setup Time   Final    GRAM NEGATIVE RODS IN BOTH AEROBIC AND ANAEROBIC BOTTLES CRITICAL RESULT CALLED TO,  READ BACK BY AND VERIFIED WITH: PHARMD ABBY Weston Anna [phone] AT 0811 BY EC Performed at [facility], [address], [place], [place] [address]    Culture ESCHERICHIA COLI (A)  Final   Report Status 06/10/2022 FINAL  Final   Organism ID, Bacteria ESCHERICHIA COLI  Final      Susceptibility   Escherichia coli - MIC*    AMPICILLIN 8 SENSITIVE Sensitive     CEFAZOLIN <=4 SENSITIVE Sensitive     CEFEPIME <=0.12 SENSITIVE Sensitive     CEFTAZIDIME <=1 SENSITIVE Sensitive     CEFTRIAXONE <=0.25 SENSITIVE Sensitive     CIPROFLOXACIN <=0.25 SENSITIVE Sensitive     GENTAMICIN <=1 SENSITIVE Sensitive     IMIPENEM <=0.25 SENSITIVE Sensitive     TRIMETH/SULFA <=20 SENSITIVE Sensitive     AMPICILLIN/SULBACTAM 4 SENSITIVE Sensitive     PIP/TAZO <=4 SENSITIVE Sensitive     * ESCHERICHIA COLI  Blood Culture ID Panel (Reflexed)     Status: Abnormal   Collection Time: 06/07/22  4:35 PM  Result Value Ref Range Status   Enterococcus faecalis NOT DETECTED NOT DETECTED Final   Enterococcus Faecium NOT DETECTED NOT DETECTED Final   Listeria monocytogenes NOT DETECTED NOT DETECTED Final   Staphylococcus species NOT DETECTED NOT DETECTED Final   Staphylococcus aureus (BCID) NOT DETECTED NOT DETECTED Final   Staphylococcus epidermidis NOT DETECTED NOT DETECTED Final   Staphylococcus lugdunensis NOT DETECTED NOT DETECTED Final   Streptococcus species NOT DETECTED NOT DETECTED Final   Streptococcus agalactiae NOT DETECTED NOT DETECTED Final   Streptococcus pneumoniae NOT DETECTED NOT DETECTED Final   Streptococcus pyogenes NOT DETECTED NOT DETECTED Final   A.calcoaceticus-baumannii NOT DETECTED NOT DETECTED Final   Bacteroides fragilis NOT DETECTED NOT DETECTED Final   Enterobacterales DETECTED (A) NOT DETECTED Final    Comment: Enterobacterales represent a large order of gram negative bacteria, not a single organism. CRITICAL RESULT CALLED TO, READ BACK BY AND VERIFIED WITH: PHARMD ABBY  Weston Anna [phone] AT 0811 BY EC    Enterobacter cloacae complex NOT DETECTED NOT DETECTED Final   Escherichia coli DETECTED (A) NOT DETECTED Final    Comment: CRITICAL RESULT CALLED TO, READ BACK BY AND VERIFIED WITH: PHARMD ABBY Weston Anna [phone] AT 0811 BY EC    Klebsiella aerogenes NOT DETECTED NOT DETECTED Final   Klebsiella oxytoca NOT DETECTED NOT DETECTED Final   Klebsiella pneumoniae NOT DETECTED NOT DETECTED Final   Proteus species NOT DETECTED NOT DETECTED Final   Salmonella species NOT DETECTED NOT DETECTED Final   Serratia marcescens  NOT DETECTED NOT DETECTED Final   Haemophilus influenzae NOT DETECTED NOT DETECTED Final   Neisseria meningitidis NOT DETECTED NOT DETECTED Final   Pseudomonas aeruginosa NOT DETECTED NOT DETECTED Final   Stenotrophomonas maltophilia NOT DETECTED NOT DETECTED Final   Candida albicans NOT DETECTED NOT DETECTED Final   Candida auris NOT DETECTED NOT DETECTED Final   Candida glabrata NOT DETECTED NOT DETECTED Final   Candida krusei NOT DETECTED NOT DETECTED Final   Candida parapsilosis NOT DETECTED NOT DETECTED Final   Candida tropicalis NOT DETECTED NOT DETECTED Final   Cryptococcus neoformans/gattii NOT DETECTED NOT DETECTED Final   CTX-M ESBL NOT DETECTED NOT DETECTED Final   Carbapenem resistance IMP NOT DETECTED NOT DETECTED Final   Carbapenem resistance KPC NOT DETECTED NOT DETECTED Final   Carbapenem resistance NDM NOT DETECTED NOT DETECTED Final   Carbapenem resist OXA 48 LIKE NOT DETECTED NOT DETECTED Final   Carbapenem resistance VIM NOT DETECTED NOT DETECTED Final    Comment: Performed at [facility], [address], [place], [place] [address]  Resp Panel by RT-PCR (Flu A&B, Covid) Anterior Nasal Swab     Status: None   Collection Time: 06/07/22  6:06 PM   Specimen: Anterior Nasal Swab  Result Value Ref Range Status   SARS Coronavirus 2 by RT PCR NEGATIVE NEGATIVE Final    Comment: (NOTE) SARS-CoV-2 target nucleic acids  are NOT DETECTED.  The SARS-CoV-2 RNA is generally detectable in upper respiratory specimens during the acute phase of infection. The lowest concentration of SARS-CoV-2 viral copies this assay can detect is 138 copies/mL. A negative result does not preclude SARS-Cov-2 infection and should not be used as the sole basis for treatment or other patient management decisions. A negative result may occur with  improper specimen collection/handling, submission of specimen other than nasopharyngeal swab, presence of viral mutation(s) within the areas targeted by this assay, and inadequate number of viral copies(<138 copies/mL). A negative result must be combined with clinical observations, patient history, and epidemiological information. The expected result is Negative.  Fact Sheet for Patients:  BloggerCourse.com  Fact Sheet for Healthcare Providers:  SeriousBroker.it  This test is no t yet approved or cleared by the [place] FDA and  has been authorized for detection and/or diagnosis of SARS-CoV-2 by FDA under an Emergency Use Authorization (EUA). This EUA will remain  in effect (meaning this test can be used) for the duration of the COVID-19 declaration under Section 564(b)(1) of the Act, 21 U.S.C.section 360bbb-3(b)(1), unless the authorization is terminated  or revoked sooner.       Influenza A by PCR NEGATIVE NEGATIVE Final   Influenza B by PCR NEGATIVE NEGATIVE Final    Comment: (NOTE) The Xpert Xpress SARS-CoV-2/FLU/RSV plus assay is intended as an aid in the diagnosis of influenza from Nasopharyngeal swab specimens and should not be used as a sole basis for treatment. Nasal washings and aspirates are unacceptable for Xpert Xpress SARS-CoV-2/FLU/RSV testing.  Fact Sheet for Patients: BloggerCourse.com  Fact Sheet for Healthcare Providers: SeriousBroker.it  This test is  not yet approved or cleared by the [place] FDA and has been authorized for detection and/or diagnosis of SARS-CoV-2 by FDA under an Emergency Use Authorization (EUA). This EUA will remain in effect (meaning this test can be used) for the duration of the COVID-19 declaration under Section 564(b)(1) of the Act, 21 U.S.C. section 360bbb-3(b)(1), unless the authorization is terminated or revoked.  Performed at [facility], [address] [place], [place] [address]   Urine Culture     Status: Abnormal   Collection Time: 06/07/22  6:37 PM   Specimen: In/Out Cath Urine  Result Value Ref Range Status   Specimen Description   Final    IN/OUT CATH URINE Performed at [facility], [address] [place], [place] [address]    Special Requests   Final    NONE Performed at [facility], [address], [place], [place] [address]    Culture (A)  Final    10,000 COLONIES/mL MULTIPLE SPECIES PRESENT, SUGGEST RECOLLECTION   Report Status 06/09/2022 FINAL  Final  MRSA Next Gen by PCR, Nasal     Status: None   Collection Time: 06/08/22  8:59 AM   Specimen: Nasal Mucosa; Nasal Swab  Result Value Ref Range Status   MRSA by PCR Next Gen NOT DETECTED NOT DETECTED Final    Comment: (NOTE) The GeneXpert MRSA Assay (FDA approved for NASAL specimens only), is one component of a comprehensive MRSA colonization surveillance program. It is not intended to diagnose MRSA infection nor to guide or monitor treatment for MRSA infections. Test performance is not FDA approved in patients less than 4 years old. Performed at [facility], [address], [place], [place] [address]      Time coordinating discharge: Over 30 minutes  SIGNED:   Alvira Philips [place], DO  [facility] 06/10/2022, 10:23 AM

## 2022-10-10 ENCOUNTER — Emergency Department (HOSPITAL_COMMUNITY): Payer: Self-pay

## 2022-10-10 ENCOUNTER — Encounter (HOSPITAL_COMMUNITY): Payer: Self-pay

## 2022-10-10 ENCOUNTER — Inpatient Hospital Stay (HOSPITAL_COMMUNITY)
Admission: EM | Admit: 2022-10-10 | Discharge: 2022-10-16 | DRG: 389 | Disposition: A | Discharge status: Home / Self Care | Payer: Self-pay | Attending: Family Medicine | Admitting: Family Medicine

## 2022-10-10 DIAGNOSIS — Z880 Allergy status to penicillin: Secondary | ICD-10-CM

## 2022-10-10 DIAGNOSIS — Z5941 Food insecurity: Secondary | ICD-10-CM

## 2022-10-10 DIAGNOSIS — Z888 Allergy status to other drugs, medicaments and biological substances status: Secondary | ICD-10-CM

## 2022-10-10 DIAGNOSIS — Z6822 Body mass index (BMI) 22.0-22.9, adult: Secondary | ICD-10-CM

## 2022-10-10 DIAGNOSIS — R9431 Abnormal electrocardiogram [ECG] [EKG]: Secondary | ICD-10-CM | POA: Diagnosis present

## 2022-10-10 DIAGNOSIS — F17210 Nicotine dependence, cigarettes, uncomplicated: Secondary | ICD-10-CM | POA: Diagnosis present

## 2022-10-10 DIAGNOSIS — Z9049 Acquired absence of other specified parts of digestive tract: Secondary | ICD-10-CM

## 2022-10-10 DIAGNOSIS — Z800 Family history of malignant neoplasm of digestive organs: Secondary | ICD-10-CM

## 2022-10-10 DIAGNOSIS — Z833 Family history of diabetes mellitus: Secondary | ICD-10-CM

## 2022-10-10 DIAGNOSIS — Z5900 Homelessness unspecified: Secondary | ICD-10-CM

## 2022-10-10 DIAGNOSIS — F1010 Alcohol abuse, uncomplicated: Secondary | ICD-10-CM | POA: Diagnosis present

## 2022-10-10 DIAGNOSIS — Z8249 Family history of ischemic heart disease and other diseases of the circulatory system: Secondary | ICD-10-CM

## 2022-10-10 DIAGNOSIS — Z91014 Allergy to mammalian meats: Secondary | ICD-10-CM

## 2022-10-10 DIAGNOSIS — F1910 Other psychoactive substance abuse, uncomplicated: Secondary | ICD-10-CM | POA: Diagnosis present

## 2022-10-10 DIAGNOSIS — G40909 Epilepsy, unspecified, not intractable, without status epilepticus: Secondary | ICD-10-CM | POA: Diagnosis present

## 2022-10-10 DIAGNOSIS — F17290 Nicotine dependence, other tobacco product, uncomplicated: Secondary | ICD-10-CM | POA: Diagnosis present

## 2022-10-10 DIAGNOSIS — E440 Moderate protein-calorie malnutrition: Secondary | ICD-10-CM | POA: Diagnosis present

## 2022-10-10 DIAGNOSIS — Z23 Encounter for immunization: Secondary | ICD-10-CM

## 2022-10-10 DIAGNOSIS — K56609 Unspecified intestinal obstruction, unspecified as to partial versus complete obstruction: Principal | ICD-10-CM | POA: Diagnosis present

## 2022-10-10 DIAGNOSIS — Z905 Acquired absence of kidney: Secondary | ICD-10-CM

## 2022-10-10 DIAGNOSIS — J4489 Other specified chronic obstructive pulmonary disease: Secondary | ICD-10-CM | POA: Diagnosis present

## 2022-10-10 LAB — CBC WITH DIFFERENTIAL/PLATELET
Abs Immature Granulocytes: 0.02 10*3/uL (ref 0.00–0.07)
Basophils Absolute: 0 10*3/uL (ref 0.0–0.1)
Basophils Relative: 0 %
Eosinophils Absolute: 0 10*3/uL (ref 0.0–0.5)
Eosinophils Relative: 0 %
HCT: 44.8 % (ref 39.0–52.0)
Hemoglobin: 15.2 g/dL (ref 13.0–17.0)
Immature Granulocytes: 0 %
Lymphocytes Relative: 16 %
Lymphs Abs: 1.2 10*3/uL (ref 0.7–4.0)
MCH: 34.6 pg — ABNORMAL HIGH (ref 26.0–34.0)
MCHC: 33.9 g/dL (ref 30.0–36.0)
MCV: 102.1 fL — ABNORMAL HIGH (ref 80.0–100.0)
Monocytes Absolute: 0.5 10*3/uL (ref 0.1–1.0)
Monocytes Relative: 7 %
Neutro Abs: 5.8 10*3/uL (ref 1.7–7.7)
Neutrophils Relative %: 77 %
Platelets: 289 10*3/uL (ref 150–400)
RBC: 4.39 MIL/uL (ref 4.22–5.81)
RDW: 14.4 % (ref 11.5–15.5)
WBC: 7.6 10*3/uL (ref 4.0–10.5)
nRBC: 0 % (ref 0.0–0.2)

## 2022-10-10 LAB — COMPREHENSIVE METABOLIC PANEL
ALT: 26 U/L (ref 0–44)
AST: 35 U/L (ref 15–41)
Albumin: 4.1 g/dL (ref 3.5–5.0)
Alkaline Phosphatase: 73 U/L (ref 38–126)
Anion gap: 13 (ref 5–15)
BUN: 13 mg/dL (ref 6–20)
CO2: 31 mmol/L (ref 22–32)
Calcium: 9.5 mg/dL (ref 8.9–10.3)
Chloride: 96 mmol/L — ABNORMAL LOW (ref 98–111)
Creatinine, Ser: 1.22 mg/dL (ref 0.61–1.24)
GFR, Estimated: >60 mL/min (ref >60)
Glucose, Bld: 107 mg/dL — ABNORMAL HIGH (ref 70–99)
Potassium: 4.2 mmol/L (ref 3.5–5.1)
Sodium: 140 mmol/L (ref 135–145)
Total Bilirubin: 0.6 mg/dL (ref 0.3–1.2)
Total Protein: 7.7 g/dL (ref 6.5–8.1)

## 2022-10-10 LAB — TROPONIN I (HIGH SENSITIVITY)
Troponin I (High Sensitivity): 10 ng/L (ref <18)
Troponin I (High Sensitivity): 9 ng/L (ref <18)

## 2022-10-10 LAB — LIPASE, BLOOD: Lipase: 32 U/L (ref 11–51)

## 2022-10-10 MED ORDER — LACTATED RINGERS IV SOLN: INTRAVENOUS | Status: DC

## 2022-10-10 MED ORDER — MORPHINE SULFATE (PF) 2 MG/ML IV SOLN
2.0000 mg | INTRAVENOUS | Status: DC | PRN
  Administered 2022-10-11: 2 mg via INTRAVENOUS
  Administered 2022-10-11: 4 mg via INTRAVENOUS
  Administered 2022-10-11 – 2022-10-12 (×8): 2 mg via INTRAVENOUS
  Administered 2022-10-13 (×2): 4 mg via INTRAVENOUS
  Administered 2022-10-13: 2 mg via INTRAVENOUS
  Administered 2022-10-13: 4 mg via INTRAVENOUS
  Administered 2022-10-14 – 2022-10-15 (×2): 2 mg via INTRAVENOUS
  Filled 2022-10-10: qty 1
  Filled 2022-10-10 (×4): qty 2
  Filled 2022-10-10 (×11): qty 1

## 2022-10-10 MED ORDER — ONDANSETRON HCL 4 MG/2ML IJ SOLN
4.0000 mg | Freq: Once | INTRAMUSCULAR | Status: AC
Start: 2022-10-10 — End: 2022-10-10
  Administered 2022-10-10: 4 mg via INTRAVENOUS
  Filled 2022-10-10: qty 2

## 2022-10-10 MED ORDER — OXYCODONE-ACETAMINOPHEN 5-325 MG PO TABS
1.0000 | ORAL_TABLET | Freq: Once | ORAL | Status: AC
  Administered 2022-10-10: 1 via ORAL
  Filled 2022-10-10: qty 1

## 2022-10-10 MED ORDER — DIATRIZOATE MEGLUMINE & SODIUM 66-10 % PO SOLN
90.0000 mL | Freq: Once | ORAL | Status: AC
  Administered 2022-10-11: 90 mL via NASOGASTRIC
  Filled 2022-10-10: qty 90

## 2022-10-10 MED ORDER — LEVETIRACETAM IN NACL 1000 MG/100ML IV SOLN
1000.0000 mg | Freq: Two times a day (BID) | INTRAVENOUS | Status: DC
  Administered 2022-10-10 – 2022-10-14 (×9): 1000 mg via INTRAVENOUS
  Filled 2022-10-10 (×12): qty 100

## 2022-10-10 MED ORDER — MORPHINE SULFATE (PF) 4 MG/ML IV SOLN
4.0000 mg | Freq: Once | INTRAVENOUS | Status: AC
  Administered 2022-10-10: 4 mg via INTRAVENOUS
  Filled 2022-10-10: qty 1

## 2022-10-10 MED ORDER — ONDANSETRON 4 MG PO TBDP
8.0000 mg | ORAL_TABLET | Freq: Once | ORAL | Status: AC
  Administered 2022-10-10: 8 mg via ORAL
  Filled 2022-10-10: qty 2

## 2022-10-10 MED ORDER — PHENOL 1.4 % MT LIQD
1.0000 | OROMUCOSAL | Status: DC | PRN
  Filled 2022-10-10: qty 177

## 2022-10-10 MED ORDER — IOHEXOL 350 MG/ML SOLN
75.0000 mL | Freq: Once | INTRAVENOUS | Status: AC | PRN
Start: 2022-10-10 — End: 2022-10-10
  Administered 2022-10-10: 75 mL via INTRAVENOUS

## 2022-10-10 MED ORDER — PROCHLORPERAZINE EDISYLATE 10 MG/2ML IJ SOLN
10.0000 mg | Freq: Four times a day (QID) | INTRAMUSCULAR | Status: DC | PRN
  Administered 2022-10-11 – 2022-10-13 (×3): 10 mg via INTRAVENOUS
  Filled 2022-10-10 (×3): qty 2

## 2022-10-10 MED ORDER — ACETAMINOPHEN 10 MG/ML IV SOLN
1000.0000 mg | Freq: Four times a day (QID) | INTRAVENOUS | Status: AC
  Administered 2022-10-11 (×4): 1000 mg via INTRAVENOUS
  Filled 2022-10-10 (×4): qty 100

## 2022-10-10 NOTE — ED Provider Triage Note (Signed)
Emergency Medicine Provider Triage Evaluation Note  David Benson , a 56 y.o. male  was evaluated in triage.  Pt complains of upper abdominal pain since last night.  Associated with nausea and 3 episodes of emesis.  States she has had 3 bowel obstructions and this feels the same.  No chest pain, he has not had a bowel movement since yesterday.  History of many previous abdominal surgeries..  Review of Systems  Per HPI  Physical Exam  BP 109/82 (BP Location: Right Arm)   Pulse 94   Temp 98.7 F (37.1 C) (Oral)   Resp 15   SpO2 94%  Gen:   Awake, no distress   Resp:  Normal effort  MSK:   Moves extremities without difficulty  Other:  tympanic abdomen, epigastric abdominal tenderness with guarding.  Upper and lower extremities pulses are symmetric bilaterally  Medical Decision Making  Medically screening exam initiated at 1:30 PM.  Appropriate orders placed.  David Benson was informed that the remainder of the evaluation will be completed by another provider, this initial triage assessment does not replace that evaluation, and the importance of remaining in the ED until their evaluation is complete.     Theron Arista, PA-C 10/10/22 1331
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
## 2022-10-10 NOTE — Assessment & Plan Note (Addendum)
Cont home keppra 1g Q12H but will order IV since he is NPO
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
## 2022-10-10 NOTE — Progress Notes (Signed)
Family medicine teaching service will be admitting this patient. Our pager information can be located in the physician sticky notes, treatment team sticky notes, and the headers of all our official daily progress notes.   FAMILY MEDICINE TEACHING SERVICE Patient - Please contact intern pager [phone] or text page via website [email or web address] ([email or web address]) for questions regarding care. DO NOT page listed attending provider unless there is no answer from the number above.   David Kolbeck, MD PGY-3,  Family Medicine Service pager [phone]   

## 2022-10-10 NOTE — Assessment & Plan Note (Signed)
High grade SBO on CT scan, history of same multiple times in past (most recently ~6 months ago).  Presumably due to adhesions from prior abd surgeries. NPO NGT IVF Gen surg called by EDP.

## 2022-10-10 NOTE — ED Triage Notes (Signed)
Pt came in POV d/t abd pain that started last night. It hurts right in the middle. Pt said the time it hurt like this was when he had a bowel obstruction. Pt stated he had been vomiting all night , but not today. No diarrhea. Rated pain 9/10. A&O X4.
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
## 2022-10-11 ENCOUNTER — Observation Stay (HOSPITAL_COMMUNITY): Payer: Self-pay

## 2022-10-11 DIAGNOSIS — R9431 Abnormal electrocardiogram [ECG] [EKG]: Secondary | ICD-10-CM | POA: Diagnosis present

## 2022-10-11 DIAGNOSIS — Z5900 Homelessness unspecified: Secondary | ICD-10-CM | POA: Insufficient documentation

## 2022-10-11 LAB — BASIC METABOLIC PANEL
Anion gap: 10 (ref 5–15)
BUN: 16 mg/dL (ref 6–20)
CO2: 34 mmol/L — ABNORMAL HIGH (ref 22–32)
Calcium: 8.8 mg/dL — ABNORMAL LOW (ref 8.9–10.3)
Chloride: 96 mmol/L — ABNORMAL LOW (ref 98–111)
Creatinine, Ser: 1.33 mg/dL — ABNORMAL HIGH (ref 0.61–1.24)
GFR, Estimated: >60 mL/min (ref >60)
Glucose, Bld: 107 mg/dL — ABNORMAL HIGH (ref 70–99)
Potassium: 3.9 mmol/L (ref 3.5–5.1)
Sodium: 140 mmol/L (ref 135–145)

## 2022-10-11 LAB — TECHNOLOGIST SMEAR REVIEW
Clinical Information: 20
WBC MORPHOLOGY: >10

## 2022-10-11 LAB — LACTIC ACID, PLASMA
Lactic Acid, Venous: 1.3 mmol/L (ref 0.5–1.9)
Lactic Acid, Venous: 1.4 mmol/L (ref 0.5–1.9)

## 2022-10-11 LAB — MAGNESIUM: Magnesium: 1.9 mg/dL (ref 1.7–2.4)

## 2022-10-11 MED ORDER — POTASSIUM CHLORIDE 10 MEQ/100ML IV SOLN
10.0000 meq | INTRAVENOUS | Status: AC
  Administered 2022-10-11 (×2): 10 meq via INTRAVENOUS
  Filled 2022-10-11 (×2): qty 100

## 2022-10-11 MED ORDER — ENOXAPARIN SODIUM 40 MG/0.4ML IJ SOSY
40.0000 mg | PREFILLED_SYRINGE | INTRAMUSCULAR | Status: DC
  Administered 2022-10-11 – 2022-10-16 (×6): 40 mg via SUBCUTANEOUS
  Filled 2022-10-11 (×6): qty 0.4

## 2022-10-11 MED ORDER — MAGNESIUM SULFATE 2 GM/50ML IV SOLN
2.0000 g | Freq: Once | INTRAVENOUS | Status: AC
  Administered 2022-10-11: 2 g via INTRAVENOUS
  Filled 2022-10-11: qty 50

## 2022-10-11 MED ORDER — ALBUTEROL SULFATE (2.5 MG/3ML) 0.083% IN NEBU: 3.0000 mL | INHALATION_SOLUTION | Freq: Four times a day (QID) | RESPIRATORY_TRACT | Status: DC | PRN

## 2022-10-11 MED ORDER — THIAMINE HCL 100 MG/ML IJ SOLN
100.0000 mg | Freq: Every day | INTRAMUSCULAR | Status: DC
  Administered 2022-10-11 – 2022-10-14 (×4): 100 mg via INTRAVENOUS
  Filled 2022-10-11 (×4): qty 2

## 2022-10-11 MED ORDER — FOLIC ACID 5 MG/ML IJ SOLN
1.0000 mg | Freq: Every day | INTRAMUSCULAR | Status: DC
  Administered 2022-10-11 – 2022-10-14 (×4): 1 mg via INTRAVENOUS
  Filled 2022-10-11 (×8): qty 0.2

## 2022-10-11 MED ORDER — NICOTINE 21 MG/24HR TD PT24
21.0000 mg | MEDICATED_PATCH | Freq: Every day | TRANSDERMAL | Status: DC
  Administered 2022-10-11 – 2022-10-16 (×6): 21 mg via TRANSDERMAL
  Filled 2022-10-11 (×6): qty 1
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
## 2022-10-11 NOTE — Assessment & Plan Note (Addendum)
VSS. Abdominal exam this AM tender to palpation but soft nondistended non-peritonitic. I/O 1750 from NG tube. K+ 3.7. Continue non-operative management, pending surgery recs. - Serial abdominal exams  - General Surgery consulted, recs appreciated  -Follow-up KUB - Electrolyte goals - K>4.0, Mag >2.0 - NG tube, NPO - LR 150ml/hr - Compazine 10 mg q6hrs prn, avoid qtc prolonging antiemetics as qtc > 500 - Pain control: scheduled tylenol 1000 mg q6hrs, morphine 2-4 mg q4hrs prn  - AM BMP

## 2022-10-11 NOTE — Assessment & Plan Note (Addendum)
EKG today Qtc 517 with PVCs. K 3.7. Mg 2.2. On keppra which could prolong QT.  - Daily EKG  - Avoid QT prolonging antiemetics, on compazine prn  - Replete electrolytes as needed

## 2022-10-11 NOTE — Assessment & Plan Note (Addendum)
CIWA scores continue to be 0. Low concern for alcohol withdrawal at this point in hospital course. Will space out CIWA scores per CIWA prevention protocol.  - CIWA protocol spaced out from q6hrs to q12hrs  - Continue IV Thiamine and folate  - Continue home keppra for seizure disorder  - Continue Nicotine 21 mg patch daily

## 2022-10-11 NOTE — Assessment & Plan Note (Signed)
Pt reports 5 cigars daily, alcohol use (as described in its own problem), and marijuana use.  - Nicotine 21 mg patch daily  - TOC consulted for polysubstance use resources

## 2022-10-11 NOTE — ED Notes (Signed)
Patient complaints of nausea

## 2022-10-11 NOTE — Plan of Care (Signed)

## 2022-10-11 NOTE — Progress Notes (Addendum)
Attempted to call wife to update. Phone rang straight to VM.   Patient reports wife Zachary Nole works as a Best boy in our hospital on [employer]. Called up to floor, secretary said she would forward [place] to her. Line stayed on hold for ~10 minutes before I hung up. Will have day team attempt to reach her.   Fayette Pho, MD
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
## 2022-10-11 NOTE — Assessment & Plan Note (Deleted)
No signs of seizure on exam today. - Continue home keppra

## 2022-10-11 NOTE — ED Notes (Signed)
Patient informed that urine is needed for UA, patient states he is not able now but will provide it when able.

## 2022-10-11 NOTE — Assessment & Plan Note (Deleted)
Patient reports being homeless. He says due to this he has difficulty keeping up with his medications as well as with other related factors (food insecurity, transportation).  - Consult to David Benson for housing resources and medication assistance placed

## 2022-10-11 NOTE — Progress Notes (Signed)
Mobility Specialist Progress Note   10/11/22 1628  Mobility  Activity Refused mobility   Pt deferring mobility stating they have not been able to get any sleep today and requesting for me to come back. Will f/u today if time permits.   Frederico Hamman Mobility Specialist Please contact via SecureChat or  Rehab office at [phone]
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
## 2022-10-12 ENCOUNTER — Inpatient Hospital Stay (HOSPITAL_COMMUNITY): Payer: Self-pay

## 2022-10-12 DIAGNOSIS — E440 Moderate protein-calorie malnutrition: Secondary | ICD-10-CM | POA: Insufficient documentation

## 2022-10-12 LAB — BASIC METABOLIC PANEL
Anion gap: 13 (ref 5–15)
BUN: 15 mg/dL (ref 6–20)
CO2: 39 mmol/L — ABNORMAL HIGH (ref 22–32)
Calcium: 9.5 mg/dL (ref 8.9–10.3)
Chloride: 91 mmol/L — ABNORMAL LOW (ref 98–111)
Creatinine, Ser: 1.26 mg/dL — ABNORMAL HIGH (ref 0.61–1.24)
GFR, Estimated: >60 mL/min (ref >60)
Glucose, Bld: 126 mg/dL — ABNORMAL HIGH (ref 70–99)
Potassium: 3.7 mmol/L (ref 3.5–5.1)
Sodium: 143 mmol/L (ref 135–145)

## 2022-10-12 LAB — MAGNESIUM: Magnesium: 2.2 mg/dL (ref 1.7–2.4)

## 2022-10-12 MED ORDER — POTASSIUM CHLORIDE 10 MEQ/100ML IV SOLN
10.0000 meq | INTRAVENOUS | Status: AC
  Administered 2022-10-12 (×3): 10 meq via INTRAVENOUS
  Filled 2022-10-12 (×2): qty 100

## 2022-10-12 MED ORDER — BISACODYL 10 MG RE SUPP
10.0000 mg | Freq: Once | RECTAL | Status: AC
  Administered 2022-10-12: 10 mg via RECTAL
  Filled 2022-10-12: qty 1

## 2022-10-12 MED ORDER — POTASSIUM CHLORIDE 10 MEQ/100ML IV SOLN
INTRAVENOUS | Status: AC
  Filled 2022-10-12: qty 100

## 2022-10-12 NOTE — Progress Notes (Signed)
Pt's NG tubing slid off while toileting in room, Clamped NG Tubing, MD made aware, Xray ordered. Awaiting for X ray results.

## 2022-10-12 NOTE — Progress Notes (Addendum)
Mobility Specialist Progress Note   10/12/22 1351  Mobility  Activity Ambulated with assistance in hallway  Level of Assistance Minimal assist, patient does 75% or more  Assistive Device Other (Comment) (IV pole)  Distance Ambulated (ft) 220 ft  Activity Response Tolerated fair  $Mobility charge 1 Mobility   Received pt in bed c/o abdominal pain(8/10) but agreeable. Session limited by pain. Pt actively grimacing throughout ambulation in flexed trunk position to guard pain. Attempted cuing pt on posture, claimed it was too unbearable but gait remained slow and steady throughout. Returned back to the chair w/o fault but claiming pain was worse. Call bell placed in reach and RN notified.     Frederico Hamman Mobility Specialist Please contact via SecureChat or  Rehab office at [phone]
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
## 2022-10-12 NOTE — Plan of Care (Signed)
  Problem: Education: Goal: Knowledge of General Education information will improve Description: Including pain rating scale, medication(s)/side effects and non-pharmacologic comfort measures Outcome: Progressing   Problem: Health Behavior/Discharge Planning: Goal: Ability to manage health-related needs will improve Outcome: Progressing   Problem: Activity: Goal: Risk for activity intolerance will decrease Outcome: Progressing   

## 2022-10-12 NOTE — Progress Notes (Signed)
Mobility Specialist Progress Note   10/12/22 1223  Mobility  Activity Refused mobility   Pt deferring mobility stating they are having a lot of abdominal pain and haven't been able to sleep. Will f/u later this afternoon if time permits.  Frederico Hamman Mobility Specialist Please contact via SecureChat or  Rehab office at [phone]
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
## 2022-10-12 NOTE — Progress Notes (Signed)
FMTS Brief Progress Note  Night nurse called regarding placing patient back on suction as he feels bloated and nauseous.  Day team nurse wrote note that NG slide out around 6:30 pm. Informed nurse to wait for Xray. KUB showed NGT in proper position. Informed night nurse to place pt NGT back to suction.   Lockie Mola, MD  PGY-1 [facility] Family [facility]
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
## 2022-10-13 LAB — BASIC METABOLIC PANEL
Anion gap: 10 (ref 5–15)
BUN: 18 mg/dL (ref 6–20)
CO2: 38 mmol/L — ABNORMAL HIGH (ref 22–32)
Calcium: 9.1 mg/dL (ref 8.9–10.3)
Chloride: 93 mmol/L — ABNORMAL LOW (ref 98–111)
Creatinine, Ser: 1.15 mg/dL (ref 0.61–1.24)
GFR, Estimated: >60 mL/min (ref >60)
Glucose, Bld: 116 mg/dL — ABNORMAL HIGH (ref 70–99)
Potassium: 3.6 mmol/L (ref 3.5–5.1)
Sodium: 141 mmol/L (ref 135–145)

## 2022-10-13 LAB — CBC
HCT: 45.1 % (ref 39.0–52.0)
Hemoglobin: 14.5 g/dL (ref 13.0–17.0)
MCH: 33.8 pg (ref 26.0–34.0)
MCHC: 32.2 g/dL (ref 30.0–36.0)
MCV: 105.1 fL — ABNORMAL HIGH (ref 80.0–100.0)
Platelets: 246 10*3/uL (ref 150–400)
RBC: 4.29 MIL/uL (ref 4.22–5.81)
RDW: 14.2 % (ref 11.5–15.5)
WBC: 8 10*3/uL (ref 4.0–10.5)
nRBC: 0 % (ref 0.0–0.2)

## 2022-10-13 LAB — MAGNESIUM: Magnesium: 2 mg/dL (ref 1.7–2.4)

## 2022-10-13 MED ORDER — POTASSIUM CHLORIDE 20 MEQ PO PACK
40.0000 meq | PACK | Freq: Once | ORAL | Status: AC
  Administered 2022-10-13: 40 meq
  Filled 2022-10-13: qty 2

## 2022-10-13 MED ORDER — POTASSIUM CHLORIDE 10 MEQ/100ML IV SOLN
10.0000 meq | INTRAVENOUS | Status: AC
  Administered 2022-10-13 (×2): 10 meq via INTRAVENOUS
  Filled 2022-10-13 (×2): qty 100

## 2022-10-13 NOTE — Progress Notes (Signed)
Patient called to desk to inform nursing staff that his NGT "came out when I stood up".  I walked into room patient holding NGT. Attempt made to reinsert, met a lot of resistance and patient tensing his muscles. Patient then refused reinsertion and said he would rather "just have the surgery".  Notified provider on call - Reather Laurence, MD who advised to make patient strict NPO and leave NGT out.
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
## 2022-10-13 NOTE — Plan of Care (Signed)

## 2022-10-14 ENCOUNTER — Other Ambulatory Visit: Payer: Self-pay

## 2022-10-14 ENCOUNTER — Inpatient Hospital Stay (HOSPITAL_COMMUNITY): Payer: Self-pay

## 2022-10-14 LAB — BASIC METABOLIC PANEL
Anion gap: 12 (ref 5–15)
BUN: 19 mg/dL (ref 6–20)
CO2: 32 mmol/L (ref 22–32)
Calcium: 8.8 mg/dL — ABNORMAL LOW (ref 8.9–10.3)
Chloride: 97 mmol/L — ABNORMAL LOW (ref 98–111)
Creatinine, Ser: 1.15 mg/dL (ref 0.61–1.24)
GFR, Estimated: >60 mL/min (ref >60)
Glucose, Bld: 76 mg/dL (ref 70–99)
Potassium: 3.9 mmol/L (ref 3.5–5.1)
Sodium: 141 mmol/L (ref 135–145)

## 2022-10-14 LAB — MAGNESIUM: Magnesium: 2 mg/dL (ref 1.7–2.4)

## 2022-10-14 LAB — GLUCOSE, CAPILLARY: Glucose-Capillary: 85 mg/dL (ref 70–99)

## 2022-10-14 MED ORDER — INFLUENZA VAC SPLIT QUAD 0.5 ML IM SUSY
0.5000 mL | PREFILLED_SYRINGE | INTRAMUSCULAR | Status: AC
  Administered 2022-10-15: 0.5 mL via INTRAMUSCULAR
  Filled 2022-10-14: qty 0.5
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
## 2022-10-14 NOTE — Plan of Care (Signed)
  Problem: Health Behavior/Discharge Planning: Goal: Ability to manage health-related needs will improve Outcome: Not Progressing   Problem: Clinical Measurements: Goal: Ability to maintain clinical measurements within normal limits will improve Outcome: Not Progressing Goal: Will remain free from infection Outcome: Not Progressing Goal: Diagnostic test results will improve Outcome: Not Progressing Goal: Respiratory complications will improve Outcome: Not Progressing Goal: Cardiovascular complication will be avoided Outcome: Not Progressing   Problem: Activity: Goal: Risk for activity intolerance will decrease Outcome: Not Progressing   Problem: Elimination: Goal: Will not experience complications related to bowel motility Outcome: Not Progressing Goal: Will not experience complications related to urinary retention Outcome: Not Progressing   Problem: Pain Managment: Goal: General experience of comfort will improve Outcome: Not Progressing   Problem: Safety: Goal: Ability to remain free from injury will improve Outcome: Not Progressing   Problem: Skin Integrity: Goal: Risk for impaired skin integrity will decrease Outcome: Not Progressing   
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
## 2022-10-15 DIAGNOSIS — K56609 Unspecified intestinal obstruction, unspecified as to partial versus complete obstruction: Secondary | ICD-10-CM

## 2022-10-15 DIAGNOSIS — F1910 Other psychoactive substance abuse, uncomplicated: Secondary | ICD-10-CM

## 2022-10-15 LAB — BASIC METABOLIC PANEL
Anion gap: 12 (ref 5–15)
BUN: 16 mg/dL (ref 6–20)
CO2: 29 mmol/L (ref 22–32)
Calcium: 8.5 mg/dL — ABNORMAL LOW (ref 8.9–10.3)
Chloride: 97 mmol/L — ABNORMAL LOW (ref 98–111)
Creatinine, Ser: 1.1 mg/dL (ref 0.61–1.24)
GFR, Estimated: >60 mL/min (ref >60)
Glucose, Bld: 117 mg/dL — ABNORMAL HIGH (ref 70–99)
Potassium: 3.2 mmol/L — ABNORMAL LOW (ref 3.5–5.1)
Sodium: 138 mmol/L (ref 135–145)

## 2022-10-15 MED ORDER — THIAMINE MONONITRATE 100 MG PO TABS
100.0000 mg | ORAL_TABLET | Freq: Every day | ORAL | Status: DC
  Administered 2022-10-15 – 2022-10-16 (×2): 100 mg via ORAL
  Filled 2022-10-15 (×2): qty 1

## 2022-10-15 MED ORDER — ACETAMINOPHEN 325 MG PO TABS
650.0000 mg | ORAL_TABLET | Freq: Four times a day (QID) | ORAL | Status: DC
  Administered 2022-10-15 – 2022-10-16 (×4): 650 mg via ORAL
  Filled 2022-10-15 (×5): qty 2

## 2022-10-15 MED ORDER — POTASSIUM CHLORIDE 20 MEQ PO PACK
40.0000 meq | PACK | Freq: Once | ORAL | Status: AC
  Administered 2022-10-15: 40 meq via ORAL
  Filled 2022-10-15: qty 2

## 2022-10-15 MED ORDER — FOLIC ACID 1 MG PO TABS
1.0000 mg | ORAL_TABLET | Freq: Every day | ORAL | Status: DC
  Administered 2022-10-15 – 2022-10-16 (×2): 1 mg via ORAL
  Filled 2022-10-15 (×2): qty 1

## 2022-10-15 MED ORDER — LEVETIRACETAM 500 MG PO TABS
1000.0000 mg | ORAL_TABLET | Freq: Two times a day (BID) | ORAL | Status: DC
  Administered 2022-10-15 – 2022-10-16 (×3): 1000 mg via ORAL
  Filled 2022-10-15 (×3): qty 2
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
## 2022-10-15 NOTE — Plan of Care (Signed)
  Problem: Health Behavior/Discharge Planning: Goal: Ability to manage health-related needs will improve Outcome: Progressing   Problem: Clinical Measurements: Goal: Will remain free from infection Outcome: Progressing   Problem: Activity: Goal: Risk for activity intolerance will decrease Outcome: Progressing   Problem: Nutrition: Goal: Adequate nutrition will be maintained Outcome: Progressing   Problem: Pain Managment: Goal: General experience of comfort will improve Outcome: Progressing   Problem: Safety: Goal: Ability to remain free from injury will improve Outcome: Progressing

## 2022-10-15 NOTE — Discharge Instructions (Addendum)
Dear David Benson,  Thank you for letting us participate in your care. You were hospitalized for nausea and vomiting, and diagnosed with SBO (small bowel obstruction) (HCC). You were treated with bowel rest.    POST-HOSPITAL & CARE INSTRUCTIONS Follow up with you PCP (listed below) Seek medical care if  Nausea/Vomiting/abdominal pain with distended stomach Unable to tolerate fluids orally/dehydrated (no urine, dry lips/mouth)   DOCTOR'S APPOINTMENT   Future Appointments  Date Time Provider Department Center  10/17/2022  9:10 AM ACCESS TO CARE POOL [id number]    Follow-up Information     Bess Kinds, MD. Go on 10/17/2022.   Specialty: Family Medicine Why: 9:10 AM (arrive at 8:50 AM) Contact information: [address] [place] [address] [phone]                 Take care and be well!  Family Medicine Teaching Service Inpatient Team [place]  [facility]  [address] [place], [place] [address] [phone]
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
## 2022-10-16 LAB — BASIC METABOLIC PANEL
Anion gap: 5 (ref 5–15)
BUN: 12 mg/dL (ref 6–20)
CO2: 28 mmol/L (ref 22–32)
Calcium: 8.1 mg/dL — ABNORMAL LOW (ref 8.9–10.3)
Chloride: 105 mmol/L (ref 98–111)
Creatinine, Ser: 0.96 mg/dL (ref 0.61–1.24)
GFR, Estimated: >60 mL/min (ref >60)
Glucose, Bld: 95 mg/dL (ref 70–99)
Potassium: 3.7 mmol/L (ref 3.5–5.1)
Sodium: 138 mmol/L (ref 135–145)

## 2022-10-16 MED ORDER — FOLIC ACID 1 MG PO TABS: 1.0000 mg | ORAL_TABLET | Freq: Every day | ORAL | 2 refills | Status: AC

## 2022-10-16 MED ORDER — NICOTINE 21 MG/24HR TD PT24: 21.0000 mg | MEDICATED_PATCH | Freq: Every day | TRANSDERMAL | 0 refills | Status: AC

## 2022-10-16 MED ORDER — VITAMIN B-1 100 MG PO TABS: 100.0000 mg | ORAL_TABLET | Freq: Every day | ORAL | 2 refills | Status: AC

## 2022-10-16 NOTE — Plan of Care (Signed)

## 2022-10-16 NOTE — TOC Transition Note (Signed)
Transition of Care [facility]) - CM/SW Discharge Note   Patient Details  Name: David Benson MRN: [id number] Date of Birth: [birth year]  Transition of Care [facility]) CM/SW Contact:  Lorri Frederick, LCSW Phone Number: 10/16/2022, 12:06 PM   Clinical Narrative:   CSW attempted to contact [facility] and [facility] regarding residential substance abuse treatment.  CSW not able to reach either place, no answer.  CSW spoke with pt and provided contact information for pt to follow up with both places for potential admission.  Pt reports he is planning to stay with his mother at [place] and can follow up from her home.  Pt also reports he can get transportation to these facilities.    Final next level of care: Home/Self Care Barriers to Discharge: Barriers Resolved   Patient Goals and CMS Choice      Discharge Placement                         Discharge Plan and Services Additional resources added to the After Visit Summary for   In-house Referral: Clinical Social Work   Post Acute Care Choice:  (TBD)          DME Arranged: N/A           HH Agency: NA        Social Determinants of Health (SDOH) Interventions SDOH Screenings   Food Insecurity: No Food Insecurity (10/14/2022)  Housing: Low Risk  (10/14/2022)  Transportation Needs: Unmet Transportation Needs (10/14/2022)  Utilities: Not At Risk (10/14/2022)  Depression (PHQ2-9): Medium Risk (04/23/2022)  Tobacco Use: High Risk (10/10/2022)     Readmission Risk Interventions    07/05/2020   11:50 AM  Readmission Risk Prevention Plan  Transportation Screening Complete  Medication Review (RN Care Manager) Referral to Pharmacy  PCP or Specialist appointment within 3-5 days of discharge Complete  HRI or Home Care Consult Complete  SW Recovery Care/Counseling Consult Complete  Palliative Care Screening Not Applicable  Skilled Nursing Facility Not Applicable

## 2022-10-17 ENCOUNTER — Inpatient Hospital Stay: Payer: Self-pay

## 2022-10-17 ENCOUNTER — Telehealth: Payer: Self-pay
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
## 2022-10-17 NOTE — Telephone Encounter (Signed)
Transition Care Management Unsuccessful Follow-up Telephone Call  Date of discharge and from where:  [facility] 10/16/2022  Attempts:  1st Attempt  Reason for unsuccessful TCM follow-up call:  Unable to leave message Pinchas Reither, LPN [facility] Nurse Health Advisor Direct Dial [phone]     

## 2022-10-17 NOTE — Patient Instructions (Incomplete)
It was great to see you! Thank you for allowing me to participate in your care!  I recommend that you always bring your medications to each appointment as this makes it easy to ensure we are on the correct medications and helps us not miss when refills are needed.  Our plans for today:  - *** -   We are checking some labs today, I will call you if they are abnormal will send you a MyChart message or a letter if they are normal.  If you do not hear about your labs in the next 2 weeks please let us know.***  Take care and seek immediate care sooner if you develop any concerns.   Dr. Graves Nipp, MD [facility]  

## 2022-10-18 NOTE — Telephone Encounter (Signed)
Patient has already been seen Karena Addison, LPN [facility] Nurse Health Advisor Direct Dial [phone]
